# Patient Record
Sex: Female | Born: 1975 | Race: White | Hispanic: Yes | Marital: Single | Smoking: Never smoker
Health system: Southern US, Community
[De-identification: ages and names within clinical notes are randomized; demographics above are authoritative.]

## PROBLEM LIST (undated history)

## (undated) DIAGNOSIS — E119 Type 2 diabetes mellitus without complications: Secondary | ICD-10-CM

## (undated) DIAGNOSIS — I4891 Unspecified atrial fibrillation: Secondary | ICD-10-CM

## (undated) DIAGNOSIS — E139 Other specified diabetes mellitus without complications: Secondary | ICD-10-CM

## (undated) DIAGNOSIS — I10 Essential (primary) hypertension: Secondary | ICD-10-CM

## (undated) DIAGNOSIS — E0590 Thyrotoxicosis, unspecified without thyrotoxic crisis or storm: Secondary | ICD-10-CM

## (undated) HISTORY — DX: Type 2 diabetes mellitus without complications: E11.9

## (undated) HISTORY — DX: Thyrotoxicosis, unspecified without thyrotoxic crisis or storm: E05.90

## (undated) HISTORY — DX: Unspecified atrial fibrillation: I48.91

---

## 2004-02-07 ENCOUNTER — Emergency Department (HOSPITAL_COMMUNITY): Admission: EM | Admit: 2004-02-07 | Discharge: 2004-02-07 | Payer: Self-pay | Admitting: Emergency Medicine

## 2004-04-09 ENCOUNTER — Emergency Department (HOSPITAL_COMMUNITY): Admission: EM | Admit: 2004-04-09 | Discharge: 2004-04-09 | Payer: Self-pay | Admitting: Family Medicine

## 2004-09-28 ENCOUNTER — Inpatient Hospital Stay (HOSPITAL_COMMUNITY): Admission: AD | Admit: 2004-09-28 | Discharge: 2004-09-28 | Payer: Self-pay | Admitting: *Deleted

## 2004-11-04 ENCOUNTER — Inpatient Hospital Stay (HOSPITAL_COMMUNITY): Admission: AD | Admit: 2004-11-04 | Discharge: 2004-11-04 | Payer: Self-pay | Admitting: Obstetrics & Gynecology

## 2005-02-06 ENCOUNTER — Inpatient Hospital Stay (HOSPITAL_COMMUNITY): Admission: AD | Admit: 2005-02-06 | Discharge: 2005-02-06 | Payer: Self-pay | Admitting: Obstetrics and Gynecology

## 2005-02-11 ENCOUNTER — Ambulatory Visit: Payer: Self-pay | Admitting: *Deleted

## 2005-02-12 ENCOUNTER — Ambulatory Visit (HOSPITAL_COMMUNITY): Admission: RE | Admit: 2005-02-12 | Discharge: 2005-02-12 | Payer: Self-pay | Admitting: *Deleted

## 2005-02-14 ENCOUNTER — Ambulatory Visit: Payer: Self-pay | Admitting: Family Medicine

## 2005-02-20 ENCOUNTER — Ambulatory Visit: Payer: Self-pay | Admitting: Family Medicine

## 2005-03-06 ENCOUNTER — Ambulatory Visit: Payer: Self-pay | Admitting: Family Medicine

## 2005-03-13 ENCOUNTER — Ambulatory Visit: Payer: Self-pay | Admitting: Family Medicine

## 2005-03-13 ENCOUNTER — Ambulatory Visit (HOSPITAL_COMMUNITY): Admission: RE | Admit: 2005-03-13 | Discharge: 2005-03-13 | Payer: Self-pay | Admitting: *Deleted

## 2005-03-18 ENCOUNTER — Inpatient Hospital Stay (HOSPITAL_COMMUNITY): Admission: AD | Admit: 2005-03-18 | Discharge: 2005-03-18 | Payer: Self-pay | Admitting: *Deleted

## 2005-03-19 ENCOUNTER — Inpatient Hospital Stay (HOSPITAL_COMMUNITY): Admission: AD | Admit: 2005-03-19 | Discharge: 2005-03-22 | Payer: Self-pay | Admitting: Obstetrics and Gynecology

## 2005-03-20 ENCOUNTER — Ambulatory Visit: Payer: Self-pay | Admitting: Obstetrics and Gynecology

## 2010-12-29 ENCOUNTER — Encounter: Payer: Self-pay | Admitting: *Deleted

## 2012-06-06 ENCOUNTER — Emergency Department (HOSPITAL_COMMUNITY): Payer: Self-pay

## 2012-06-06 ENCOUNTER — Encounter (HOSPITAL_COMMUNITY): Payer: Self-pay | Admitting: Cardiology

## 2012-06-06 ENCOUNTER — Emergency Department (HOSPITAL_COMMUNITY)
Admission: EM | Admit: 2012-06-06 | Discharge: 2012-06-06 | Disposition: A | Discharge status: Home / Self Care | Payer: Self-pay | Attending: Emergency Medicine | Admitting: Emergency Medicine

## 2012-06-06 DIAGNOSIS — S301XXA Contusion of abdominal wall, initial encounter: Secondary | ICD-10-CM | POA: Insufficient documentation

## 2012-06-06 DIAGNOSIS — R42 Dizziness and giddiness: Secondary | ICD-10-CM | POA: Insufficient documentation

## 2012-06-06 DIAGNOSIS — R51 Headache: Secondary | ICD-10-CM | POA: Insufficient documentation

## 2012-06-06 DIAGNOSIS — R079 Chest pain, unspecified: Secondary | ICD-10-CM | POA: Insufficient documentation

## 2012-06-06 DIAGNOSIS — S0990XA Unspecified injury of head, initial encounter: Secondary | ICD-10-CM | POA: Insufficient documentation

## 2012-06-06 DIAGNOSIS — R109 Unspecified abdominal pain: Secondary | ICD-10-CM | POA: Insufficient documentation

## 2012-06-06 LAB — CBC WITH DIFFERENTIAL/PLATELET
Basophils Absolute: 0 10*3/uL (ref 0.0–0.1)
Basophils Relative: 0 % (ref 0–1)
Eosinophils Absolute: 0 10*3/uL (ref 0.0–0.7)
Eosinophils Relative: 0 % (ref 0–5)
HCT: 39.8 % (ref 36.0–46.0)
Hemoglobin: 13.4 g/dL (ref 12.0–15.0)
Lymphocytes Relative: 17 % (ref 12–46)
Lymphs Abs: 2.3 10*3/uL (ref 0.7–4.0)
MCH: 29.5 pg (ref 26.0–34.0)
MCHC: 33.7 g/dL (ref 30.0–36.0)
MCV: 87.5 fL (ref 78.0–100.0)
Monocytes Absolute: 0.8 10*3/uL (ref 0.1–1.0)
Monocytes Relative: 6 % (ref 3–12)
Neutro Abs: 10 10*3/uL — ABNORMAL HIGH (ref 1.7–7.7)
Neutrophils Relative %: 76 % (ref 43–77)
Platelets: 195 10*3/uL (ref 150–400)
RBC: 4.55 MIL/uL (ref 3.87–5.11)
RDW: 12.7 % (ref 11.5–15.5)
WBC: 13 10*3/uL — ABNORMAL HIGH (ref 4.0–10.5)

## 2012-06-06 LAB — POCT I-STAT, CHEM 8
BUN: 9 mg/dL (ref 6–23)
Calcium, Ion: 1.23 mmol/L (ref 1.12–1.32)
Chloride: 106 mEq/L (ref 96–112)
Creatinine, Ser: 0.6 mg/dL (ref 0.50–1.10)
Glucose, Bld: 112 mg/dL — ABNORMAL HIGH (ref 70–99)
HCT: 40 % (ref 36.0–46.0)
Hemoglobin: 13.6 g/dL (ref 12.0–15.0)
Potassium: 3.7 mEq/L (ref 3.5–5.1)
Sodium: 142 mEq/L (ref 135–145)
TCO2: 22 mmol/L (ref 0–100)

## 2012-06-06 LAB — POCT PREGNANCY, URINE: Preg Test, Ur: NEGATIVE

## 2012-06-06 MED ORDER — MORPHINE SULFATE 4 MG/ML IJ SOLN
4.0000 mg | Freq: Once | INTRAMUSCULAR | Status: AC
  Administered 2012-06-06: 4 mg via INTRAVENOUS
  Filled 2012-06-06: qty 1

## 2012-06-06 MED ORDER — HYDROCODONE-ACETAMINOPHEN 5-500 MG PO TABS: 1.0000 | ORAL_TABLET | Freq: Four times a day (QID) | ORAL | Status: AC | PRN

## 2012-06-06 MED ORDER — ONDANSETRON HCL 4 MG/2ML IJ SOLN
4.0000 mg | Freq: Once | INTRAMUSCULAR | Status: AC
  Administered 2012-06-06: 4 mg via INTRAVENOUS
  Filled 2012-06-06: qty 2

## 2012-06-06 MED ORDER — DIPHENHYDRAMINE HCL 50 MG/ML IJ SOLN: 12.5000 mg | Freq: Once | INTRAMUSCULAR | Status: DC

## 2012-06-06 MED ORDER — IOHEXOL 300 MG/ML  SOLN
100.0000 mL | Freq: Once | INTRAMUSCULAR | Status: AC | PRN
  Administered 2012-06-06: 100 mL via INTRAVENOUS

## 2012-06-06 MED ORDER — METHYLPREDNISOLONE SODIUM SUCC 125 MG IJ SOLR: 125.0000 mg | Freq: Once | INTRAMUSCULAR | Status: DC

## 2012-06-06 MED ORDER — IBUPROFEN 600 MG PO TABS: 600.0000 mg | ORAL_TABLET | Freq: Four times a day (QID) | ORAL | Status: AC | PRN

## 2012-06-06 MED ORDER — FAMOTIDINE IN NACL 20-0.9 MG/50ML-% IV SOLN: 20.0000 mg | Freq: Once | INTRAVENOUS | Status: DC
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
## 2012-06-06 NOTE — ED Notes (Signed)
Patient currently denying chest pain. Reports she is feeling much better at this time. Some tenderness noted to right flank.
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
## 2012-06-07 NOTE — ED Provider Notes (Signed)
Medical screening examination/treatment/procedure(s) were performed by non-physician practitioner and as supervising physician I was immediately available for consultation/collaboration.  Shamira Toutant K Linker, MD 06/07/12 1506 

## 2014-03-22 ENCOUNTER — Encounter (HOSPITAL_COMMUNITY): Payer: Self-pay | Admitting: Emergency Medicine

## 2014-03-22 ENCOUNTER — Emergency Department (INDEPENDENT_AMBULATORY_CARE_PROVIDER_SITE_OTHER)
Admission: EM | Admit: 2014-03-22 | Discharge: 2014-03-22 | Disposition: A | Discharge status: Home / Self Care | Payer: Self-pay | Source: Home / Self Care | Attending: Emergency Medicine | Admitting: Emergency Medicine

## 2014-03-22 DIAGNOSIS — N63 Unspecified lump in unspecified breast: Secondary | ICD-10-CM

## 2014-03-22 DIAGNOSIS — N6310 Unspecified lump in the right breast, unspecified quadrant: Secondary | ICD-10-CM

## 2014-03-22 NOTE — ED Provider Notes (Signed)
CSN: [id number]     Arrival date & time 03/22/14  1818 History   First MD Initiated Contact with Patient 03/22/14 2007     Chief Complaint  Patient presents with  . Back Pain   (Consider location/radiation/quality/duration/timing/severity/associated sxs/prior Treatment) HPI  Right flank pain:  1 week duration, starts on side of ribs and radiates across right breast, pt has noticed a subjective fever with the pain and this is relieved by ibuprofen; she denies any trauma or falls but does repeated lifting in the house, no swelling of the breast, changes to the skin, or nipple drainage; no history of back surgery or imaging; history of breast cancer     History reviewed. No pertinent past medical history. History reviewed. No pertinent past surgical history. No family history on file. History  Substance Use Topics  . Smoking status: Never Smoker   . Smokeless tobacco: Not on file  . Alcohol Use: No   OB History   Grav Para Term Preterm Abortions TAB SAB Ect Mult Living                 Review of Systems Negative for weight loss, chills, decreased appetite Allergies  Review of patient's allergies indicates no known allergies.  Home Medications   Prior to Admission medications   Medication Sig Start Date End Date Taking? Authorizing Provider  ibuprofen (ADVIL,MOTRIN) 200 MG tablet Take 200 mg by mouth every 6 (six) hours as needed.   Yes Historical Provider, MD   BP 154/89  Pulse 66  Temp(Src) 98 F (36.7 C) (Oral)  SpO2 100% Physical Exam Gen. Obese Hispanic female, non-ill appearing Cardiovascular: regular rate and rhythm, 2/6 systolic murmur at left upper sternal border Pulmonary: no work of breathing clear to auscultation bilaterally Chest wall: mild tenderness to palpation of ribs 67 and 8 at mid axillary line on the right Right breast: tenderness palpation right upper quadrant where there is a firm, fixed irregularly shaped 2 cm x 1 cm mass, no nipple drainage or  skin change, right axilla with palpable tender lymph node Left breast: normal appearance, no masses palpable  ED Course  Procedures (including critical care time) Labs Review Labs Reviewed - No data to display   Imaging Review No results found.   MDM   1. Breast mass, right    This breast mass is concerning given age, unilateral, and isolated with tender lymphadenopathy. I will refer the patient for a diagnostic mammogram and recommend continuing ibuprofen with a 15 lb weight lifting restriction for now.      Garnetta BuddyEdward V Yomayra Tate, MD 03/22/14 2131

## 2014-03-22 NOTE — Discharge Instructions (Signed)
Fue Curatorun placer conocerte. Lamento que usted tiene Journalist, newspaperdolor en el pecho. Tenemos que tener su pecho evaluado con [place] mamografa. He realizado un pedido de [place], y el centro de imgenes debera llamarle. Si usted no ha recibido Owens & Minoruna llamada en 3 das, por favor llamar a Programme researcher, broadcasting/film/videovolver. Por favor, contine ibuprofeno, segn sea necesario.  [place],  Dr. Clinton SawyerWilliamson  It was nice to meet you. I am sorry that you have pain in your breast. We need to have your breast evaluated with a mammogram.  I have placed an order for this, and the imaging center should call you. If you have not recieved a call in 3 days, then please call us back. Please continue ibuprofen as needed.   Take Care,   Dr. Clinton Sawyerwilliamson

## 2014-03-22 NOTE — ED Notes (Signed)
Pain in torso, pain in right breast predominantly.  Described as soreness.  Reports fatigue and fever. No known injury.  Reports head congestion and cough

## 2014-03-23 ENCOUNTER — Other Ambulatory Visit (HOSPITAL_COMMUNITY): Payer: Self-pay | Admitting: Family Medicine

## 2014-03-23 DIAGNOSIS — N644 Mastodynia: Secondary | ICD-10-CM

## 2014-03-23 DIAGNOSIS — N63 Unspecified lump in unspecified breast: Secondary | ICD-10-CM

## 2014-03-23 NOTE — ED Provider Notes (Signed)
Medical screening examination/treatment/procedure(s) were performed by a resident physician and as supervising physician I was immediately available for consultation/collaboration.  Leslee Homeavid Greg Cratty, M.D.  Reuben Likesavid C Taevin Mcferran, MD 03/23/14 [phone]

## 2014-04-05 ENCOUNTER — Other Ambulatory Visit: Payer: Self-pay

## 2014-04-25 ENCOUNTER — Encounter: Payer: Self-pay | Admitting: Family Medicine

## 2014-04-25 ENCOUNTER — Telehealth: Payer: Self-pay | Admitting: Family Medicine
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
## 2014-07-14 ENCOUNTER — Other Ambulatory Visit: Payer: Self-pay | Admitting: Nurse Practitioner

## 2014-07-14 DIAGNOSIS — N63 Unspecified lump in unspecified breast: Secondary | ICD-10-CM

## 2014-08-07 ENCOUNTER — Ambulatory Visit
Admission: RE | Admit: 2014-08-07 | Discharge: 2014-08-07 | Disposition: A | Discharge status: Home / Self Care | Payer: No Typology Code available for payment source | Source: Ambulatory Visit | Attending: Nurse Practitioner | Admitting: Nurse Practitioner

## 2014-08-07 DIAGNOSIS — N63 Unspecified lump in unspecified breast: Secondary | ICD-10-CM

## 2015-02-12 ENCOUNTER — Other Ambulatory Visit: Payer: Self-pay | Admitting: Nurse Practitioner

## 2015-02-12 DIAGNOSIS — R921 Mammographic calcification found on diagnostic imaging of breast: Secondary | ICD-10-CM

## 2015-03-01 ENCOUNTER — Other Ambulatory Visit: Payer: Self-pay | Admitting: Obstetrics and Gynecology

## 2015-03-01 DIAGNOSIS — R921 Mammographic calcification found on diagnostic imaging of breast: Secondary | ICD-10-CM

## 2015-03-02 ENCOUNTER — Other Ambulatory Visit (HOSPITAL_COMMUNITY): Payer: Self-pay | Admitting: *Deleted

## 2015-03-02 DIAGNOSIS — R921 Mammographic calcification found on diagnostic imaging of breast: Secondary | ICD-10-CM

## 2015-03-22 ENCOUNTER — Ambulatory Visit
Admission: RE | Admit: 2015-03-22 | Discharge: 2015-03-22 | Disposition: A | Discharge status: Home / Self Care | Payer: No Typology Code available for payment source | Source: Ambulatory Visit | Attending: Obstetrics and Gynecology | Admitting: Obstetrics and Gynecology

## 2015-03-22 ENCOUNTER — Encounter (HOSPITAL_COMMUNITY): Payer: Self-pay

## 2015-03-22 ENCOUNTER — Ambulatory Visit (HOSPITAL_COMMUNITY)
Admission: RE | Admit: 2015-03-22 | Discharge: 2015-03-22 | Disposition: A | Discharge status: Home / Self Care | Payer: Self-pay | Source: Ambulatory Visit | Attending: Obstetrics and Gynecology | Admitting: Obstetrics and Gynecology

## 2015-03-22 ENCOUNTER — Other Ambulatory Visit: Payer: Self-pay

## 2015-03-22 VITALS — BP 120/84 | Temp 98.0°F | Ht 59.0 in | Wt 171.8 lb

## 2015-03-22 DIAGNOSIS — R921 Mammographic calcification found on diagnostic imaging of breast: Secondary | ICD-10-CM

## 2015-03-22 DIAGNOSIS — Z1239 Encounter for other screening for malignant neoplasm of breast: Secondary | ICD-10-CM

## 2015-03-22 NOTE — Progress Notes (Signed)
No complaints today.  Pap Smear:  Pap smear not completed today. [facility] and normal per patient. Per patient has no history of an abnormal Pap smear. No Pap smear results in EPIC.  Physical exam: Breasts Breasts symmetrical. No skin abnormalities bilateral breasts. No nipple retraction bilateral breasts. No nipple discharge bilateral breasts. No lymphadenopathy. No lumps palpated bilateral breasts. No complaints of pain or tenderness on exam. [facility] breast diagnostic mamm[facility] per recommendation. Appointment scheduled for Thursday, March 22, 2015 at 1000.    Pelvic/Bimanual No Pap smear completed today since last Pap smear was in May 2015 per patient. Pap smear not indicated per BCCCP guidelines.   Used interpreter [place].

## 2015-03-22 NOTE — Patient Instructions (Signed)
Educational materials on self breast awareness given. Explained to [name] that she did not need a Pap smear today due to last Pap smear was in May 2015 per patient. Let her know BCCCP will cover Pap smears every 3 years unless has a history of abnormal Pap smears. [facility] breast diagnostic mamm[facility] per recommendation. Appointment scheduled for Thursday, March 22, 2015 at 1000. Patient aware of appointment and will be there. [name] verbalized understanding.  Laurell Coalson, Kathaleen Maserhristine Poll, RN 8:47 AM

## 2015-08-20 ENCOUNTER — Other Ambulatory Visit: Payer: Self-pay | Admitting: Obstetrics and Gynecology

## 2015-08-20 DIAGNOSIS — R921 Mammographic calcification found on diagnostic imaging of breast: Secondary | ICD-10-CM

## 2015-08-29 ENCOUNTER — Other Ambulatory Visit: Payer: Self-pay | Admitting: Obstetrics and Gynecology

## 2015-08-29 ENCOUNTER — Ambulatory Visit
Admission: RE | Admit: 2015-08-29 | Discharge: 2015-08-29 | Disposition: A | Discharge status: Home / Self Care | Payer: No Typology Code available for payment source | Source: Ambulatory Visit | Attending: Obstetrics and Gynecology | Admitting: Obstetrics and Gynecology

## 2015-08-29 DIAGNOSIS — R928 Other abnormal and inconclusive findings on diagnostic imaging of breast: Secondary | ICD-10-CM

## 2015-08-29 DIAGNOSIS — R921 Mammographic calcification found on diagnostic imaging of breast: Secondary | ICD-10-CM

## 2015-08-30 ENCOUNTER — Ambulatory Visit
Admission: RE | Admit: 2015-08-30 | Discharge: 2015-08-30 | Disposition: A | Discharge status: Home / Self Care | Payer: No Typology Code available for payment source | Source: Ambulatory Visit | Attending: Obstetrics and Gynecology | Admitting: Obstetrics and Gynecology

## 2015-08-30 DIAGNOSIS — R928 Other abnormal and inconclusive findings on diagnostic imaging of breast: Secondary | ICD-10-CM

## 2015-11-07 ENCOUNTER — Encounter (HOSPITAL_COMMUNITY): Payer: Self-pay | Admitting: Neurology

## 2015-11-07 ENCOUNTER — Emergency Department (HOSPITAL_COMMUNITY): Payer: No Typology Code available for payment source

## 2015-11-07 ENCOUNTER — Emergency Department (HOSPITAL_COMMUNITY)
Admission: EM | Admit: 2015-11-07 | Discharge: 2015-11-07 | Disposition: A | Discharge status: Home / Self Care | Payer: Self-pay | Attending: Emergency Medicine | Admitting: Emergency Medicine

## 2015-11-07 ENCOUNTER — Emergency Department (HOSPITAL_COMMUNITY): Payer: Self-pay

## 2015-11-07 DIAGNOSIS — K529 Noninfective gastroenteritis and colitis, unspecified: Secondary | ICD-10-CM | POA: Insufficient documentation

## 2015-11-07 DIAGNOSIS — R509 Fever, unspecified: Secondary | ICD-10-CM | POA: Insufficient documentation

## 2015-11-07 DIAGNOSIS — R1084 Generalized abdominal pain: Secondary | ICD-10-CM

## 2015-11-07 DIAGNOSIS — R300 Dysuria: Secondary | ICD-10-CM | POA: Insufficient documentation

## 2015-11-07 DIAGNOSIS — R51 Headache: Secondary | ICD-10-CM | POA: Insufficient documentation

## 2015-11-07 DIAGNOSIS — R350 Frequency of micturition: Secondary | ICD-10-CM | POA: Insufficient documentation

## 2015-11-07 LAB — COMPREHENSIVE METABOLIC PANEL
ALK PHOS: 81 U/L (ref 38–126)
ALT: 44 U/L (ref 14–54)
AST: 31 U/L (ref 15–41)
Albumin: 3.8 g/dL (ref 3.5–5.0)
Anion gap: 7 (ref 5–15)
BILIRUBIN TOTAL: 0.2 mg/dL — AB (ref 0.3–1.2)
CHLORIDE: 104 mmol/L (ref 101–111)
CO2: 26 mmol/L (ref 22–32)
Calcium: 8.7 mg/dL — ABNORMAL LOW (ref 8.9–10.3)
Creatinine, Ser: 0.71 mg/dL (ref 0.44–1.00)
GFR calc non Af Amer: >60 mL/min (ref >60)
Glucose, Bld: 97 mg/dL (ref 65–99)
Potassium: 3.5 mmol/L (ref 3.5–5.1)
SODIUM: 137 mmol/L (ref 135–145)
Total Protein: 8 g/dL (ref 6.5–8.1)

## 2015-11-07 LAB — CBC
HCT: 40.8 % (ref 36.0–46.0)
HEMOGLOBIN: 13.6 g/dL (ref 12.0–15.0)
MCH: 29.3 pg (ref 26.0–34.0)
MCHC: 33.3 g/dL (ref 30.0–36.0)
MCV: 87.9 fL (ref 78.0–100.0)
PLATELETS: 189 10*3/uL (ref 150–400)
RBC: 4.64 MIL/uL (ref 3.87–5.11)
RDW: 12.7 % (ref 11.5–15.5)
WBC: 7.4 10*3/uL (ref 4.0–10.5)

## 2015-11-07 LAB — I-STAT BETA HCG BLOOD, ED (MC, WL, AP ONLY)

## 2015-11-07 LAB — URINALYSIS, ROUTINE W REFLEX MICROSCOPIC
BILIRUBIN URINE: NEGATIVE
Glucose, UA: NEGATIVE mg/dL
Ketones, ur: NEGATIVE mg/dL
Leukocytes, UA: NEGATIVE
Nitrite: NEGATIVE
PROTEIN: NEGATIVE mg/dL
Specific Gravity, Urine: 1.009 (ref 1.005–1.030)
pH: 6 (ref 5.0–8.0)

## 2015-11-07 LAB — LIPASE, BLOOD: Lipase: 22 U/L (ref 11–51)

## 2015-11-07 LAB — URINE MICROSCOPIC-ADD ON

## 2015-11-07 MED ORDER — IBUPROFEN 800 MG PO TABS: 800.0000 mg | ORAL_TABLET | Freq: Three times a day (TID) | ORAL | Status: DC

## 2015-11-07 MED ORDER — ONDANSETRON HCL 4 MG/2ML IJ SOLN
4.0000 mg | Freq: Once | INTRAMUSCULAR | Status: AC
  Administered 2015-11-07: 4 mg via INTRAVENOUS
  Filled 2015-11-07: qty 2

## 2015-11-07 MED ORDER — MORPHINE SULFATE (PF) 4 MG/ML IV SOLN
4.0000 mg | Freq: Once | INTRAVENOUS | Status: AC
  Administered 2015-11-07: 4 mg via INTRAVENOUS
  Filled 2015-11-07: qty 1

## 2015-11-07 MED ORDER — METRONIDAZOLE 500 MG PO TABS: 500.0000 mg | ORAL_TABLET | Freq: Three times a day (TID) | ORAL | Status: DC

## 2015-11-07 MED ORDER — IOHEXOL 300 MG/ML  SOLN
100.0000 mL | Freq: Once | INTRAMUSCULAR | Status: AC | PRN
Start: 2015-11-07 — End: 2015-11-07
  Administered 2015-11-07: 100 mL via INTRAVENOUS

## 2015-11-07 MED ORDER — SODIUM CHLORIDE 0.9 % IV BOLUS (SEPSIS)
1000.0000 mL | Freq: Once | INTRAVENOUS | Status: AC
  Administered 2015-11-07: 1000 mL via INTRAVENOUS

## 2015-11-07 MED ORDER — CIPROFLOXACIN HCL 500 MG PO TABS: 500.0000 mg | ORAL_TABLET | Freq: Two times a day (BID) | ORAL | Status: DC

## 2015-11-07 NOTE — ED Notes (Addendum)
Pt reports n/d, generalized abd pain for several days. Has has a fever also.

## 2015-11-07 NOTE — ED Notes (Signed)
Pt taken to CT.

## 2015-11-07 NOTE — ED Provider Notes (Signed)
CSN: [id number]     Arrival date & time 11/07/15  1527 History   First MD Initiated Contact with Patient 11/07/15 1832     Chief Complaint  Patient presents with  . Abdominal Pain     (Consider location/radiation/quality/duration/timing/severity/associated sxs/prior Treatment) Patient is a 39 y.o. female presenting with abdominal pain. The history is provided by the patient. The history is limited by a language barrier. Language interpreter used: Patient's son.  Abdominal Pain    Susan [name] is a 39 y.o. female with no significant PMH who presents with gradual onset, worsening, moderate generalized abdominal pain for the past 3 days.  She describes it as ripping, and it only occurs when she eats or drinks.  She denies pain if she is not eating or drinking.  She has tried ibuprofen and pepto bismol with mild relief.  Associated symptoms include nausea and nonbloody diarrhea, increased urinary frequency, dysuria, subjective fevers, headache.  Denies hematuria, vaginal discharge, or vaginal bleeding.   History reviewed. No pertinent past medical history. History reviewed. No pertinent past surgical history. Family History  Problem Relation Age of Onset  . Diabetes Mother   . Diabetes Father    Social History  Substance Use Topics  . Smoking status: Never Smoker   . Smokeless tobacco: Never Used  . Alcohol Use: No   OB History    Gravida Para Term Preterm AB TAB SAB Ectopic Multiple Living   5 4 4  1 1    4      Review of Systems  All other systems negative unless otherwise stated in HPI     Allergies  Review of patient's allergies indicates no known allergies.  Home Medications   Prior to Admission medications   Medication Sig Start Date End Date Taking? Authorizing Provider  bismuth subsalicylate (PEPTO BISMOL) 262 MG/15ML suspension Take 30 mLs by mouth every 6 (six) hours as needed for indigestion.    Yes Historical Provider, MD  ciprofloxacin (CIPRO) 500 MG  tablet Take 1 tablet (500 mg total) by mouth 2 (two) times daily. 11/07/15   Cheri FowlerKayla Jung Yurchak, PA-C  ibuprofen (ADVIL,MOTRIN) 800 MG tablet Take 1 tablet (800 mg total) by mouth 3 (three) times daily. 11/07/15   Cheri FowlerKayla Codey Burling, PA-C  metroNIDAZOLE (FLAGYL) 500 MG tablet Take 1 tablet (500 mg total) by mouth 3 (three) times daily. 11/07/15   Ramsay Bognar, PA-C   BP 166/84 mmHg  Pulse 84  Temp(Src) 98.2 F (36.8 C) (Oral)  Resp 16  SpO2 100%  LMP 10/02/2015 Physical Exam  Constitutional: She is oriented to person, place, and time. She appears well-developed and well-nourished.  HENT:  Head: Normocephalic and atraumatic.  Mouth/Throat: Oropharynx is clear and moist.  Eyes: Conjunctivae are normal. Pupils are equal, round, and reactive to light.  Neck: Normal range of motion. Neck supple.  Cardiovascular: Normal rate, regular rhythm and normal heart sounds.   No murmur heard. Pulmonary/Chest: Effort normal and breath sounds normal. No accessory muscle usage or stridor. No respiratory distress. She has no wheezes. She has no rhonchi. She has no rales.  Abdominal: Soft. Bowel sounds are normal. She exhibits no distension. There is generalized tenderness. There is no rigidity, no rebound and no guarding.  Musculoskeletal: Normal range of motion.  Lymphadenopathy:    She has no cervical adenopathy.  Neurological: She is alert and oriented to person, place, and time.  Speech clear without dysarthria.  Skin: Skin is warm and dry.  Psychiatric: She has a normal mood and  affect. Her behavior is normal.    ED Course  Procedures (including critical care time) Labs Review Labs Reviewed  COMPREHENSIVE METABOLIC PANEL - Abnormal; Notable for the following:    BUN <5 (*)    Calcium 8.7 (*)    Total Bilirubin 0.2 (*)    All other components within normal limits  URINALYSIS, ROUTINE W REFLEX MICROSCOPIC (NOT AT [facility]) - Abnormal; Notable for the following:    Hgb urine dipstick MODERATE (*)    All other  components within normal limits  URINE MICROSCOPIC-ADD ON - Abnormal; Notable for the following:    Squamous Epithelial / LPF 0-5 (*)    Bacteria, UA RARE (*)    All other components within normal limits  LIPASE, BLOOD  CBC  I-STAT BETA HCG BLOOD, ED (MC, WL, AP ONLY)    Imaging Review Ct Abdomen Pelvis W Contrast  11/07/2015  CLINICAL DATA:  Generalized abdominal pain for several days with nausea and diarrhea. Fever. Right lower quadrant pain and tenderness. EXAM: CT ABDOMEN AND PELVIS WITH CONTRAST TECHNIQUE: Multidetector CT imaging of the abdomen and pelvis was performed using the standard protocol following bolus administration of intravenous contrast. CONTRAST:  OMNIPAQUE IOHEXOL 300 MG/ML  SOLN COMPARISON:  06/06/2012 FINDINGS: Lower chest: Minor dependent bibasilar atelectasis. Normal heart size. No pericardial or pleural effusion. Hepatobiliary: Diffuse mild hypoattenuation of the liver parenchyma compatible with hepatic steatosis or fatty infiltration. No focal hepatic abnormality or biliary dilatation. Hepatic and portal veins are patent. Gallbladder and biliary system unremarkable. Pancreas: No mass, inflammatory changes, or other significant abnormality. Spleen: Within normal limits in size and appearance. Adrenals/Urinary Tract: No masses identified. No evidence of hydronephrosis. Stomach/Bowel: Negative for bowel obstruction, significant dilatation, ileus, or free air. Wall thickening and edema present of the cecum, right colon, and transverse colon. There is sparing of the splenic flexure and distal colon. Appearance is compatible with mild colitis. Vascular/Lymphatic: No adenopathy. Intact aorta. No aneurysm or vascular process. Mesenteric vasculature all appear patent. Reproductive: Uterus and adnexal normal in size. No pelvic free fluid or fluid collection. Other: No inguinal abnormality or hernia.  Intact abdominal wall. Musculoskeletal:  No acute or abnormal osseous finding.  IMPRESSION: Mild acute colitis involving the cecum, right colon, and transverse colon. Remainder of the colon is spared. No associated obstruction, ileus, or free air.  No pneumatosis. Mild hepatic steatosis Electronically Signed   By: Judie Petit.  Shick M.D.   On: 11/07/2015 21:14   US Abdomen Limited Ruq  11/07/2015  CLINICAL DATA:  Postprandial abdominal pain. EXAM: US ABDOMEN LIMITED - RIGHT UPPER QUADRANT COMPARISON:  None. FINDINGS: Gallbladder: No gallstones or wall thickening visualized. No sonographic Murphy sign noted. Common bile duct: Diameter: Normal in caliber at the porta hepatis measuring 3.9 mm diameter. Upper limits of normal at the pancreatic head measuring 7 mm. No bile duct stone seen. No intrahepatic bile duct dilatation. Liver: Diffusely echogenic consistent with fatty infiltration. No focal mass or lesion identified within the liver. No intrahepatic bile duct dilatation seen. Main portal vein is shown to be patent with appropriate hepatopetal direction of blood flow. IMPRESSION: 1. Fatty infiltration of the liver. 2. Gallbladder appears normal. No gallstones seen. No sonographic evidence of acute cholecystitis. Electronically Signed   By: Bary Richard M.D.   On: 11/07/2015 19:56   I have personally reviewed and evaluated these images and lab results as part of my medical decision-making.   EKG Interpretation None      MDM   Final  diagnoses:  Colitis    Patient presents with generalized abdominal pain for the past couple of days made worse with eating.  VSS, NAD.  On exam, heart RRR, lungs CTAB, abdomen is soft with generalized tenderness, no rebound or guarding.  Will obtain labs and RUQ u/s.  -CMP, CBC, lipase unremarkable. -hcg negative -UA shows moderate hgb, RBC 6-30.   -RUQ U/S shows no evidence of cholelithiasis or acute cholecystitis.  Will obtain CT abdomen.  CT abdomen shows mild acute colitis involving the cecum, right colon, and transverse colon.  Will treat with  cipro and flagyl.  Evaluation does not show pathology requring ongoing emergent intervention or admission. Pt is hemodynamically stable and mentating appropriately. Discussed findings/results and plan with patient/guardian, who agrees with plan. All questions answered. Return precautions discussed and outpatient follow up given.   Case has been discussed with and seen by Dr. Denton Lank who agrees with the above plan for discharge.        Cheri Fowler, PA-C 11/07/15 2200  Cathren L[place], MD 11/07/15 [phone]

## 2015-11-07 NOTE — ED Notes (Signed)
Pt taken to US

## 2015-11-07 NOTE — Discharge Instructions (Signed)
Colitis (Colitis) La colitis es la inflamacin del colon y puede durar un breve perodo [place](aguda) o prolongarse mucho tiempo (crnica). CAUSAS Esta afeccin puede ser causada por lo siguiente:  Virus.  Bacterias.  Reacciones a medicamentos.  Algunas enfermedades autoinmunitarias, como la enfermedad de Crohn y la colitis ulcerosa. SNTOMAS Los sntomas de esta afeccin incluyen lo siguiente:  Diarrea.  Materia fecal sanguinolenta o alquitranada.  Dolor.  Grant RutsFiebre.  Vmitos.  Cansancio (fatiga).  Prdida de peso.  Meteorismo.  Aumento repentino del dolor abdominal.  Menos deposiciones que lo habitual. DIAGNSTICO Esta afeccin se diagnostica mediante un anlisis de materia fecal o de sangre. Tambin pueden Dean Foods Companyhacerse otros estudios, como radiografas, una tomografa computarizada (TC) o una colonoscopia. TRATAMIENTO El tratamiento puede incluir lo siguiente:  Visual merchandiserDejar descansar a los intestinos, es Designer, jewellerydecir, no consumir alimentos ni lquidos durante un tiempo.  Administracin de lquidos por va intravenosa (IV).  Medicamentos para Chief Technology Officerel dolor y Technical sales engineerla diarrea.  Antibiticos.  Medicamentos con cortisona.  Ciruga. INSTRUCCIONES PARA EL CUIDADO EN EL HOGAR Comida y bebida  Siga las indicaciones del mdico respecto de las restricciones para las comidas o las bebidas.  Beba suficiente lquido para Photographermantener la orina clara o de color amarillo plido.  Trabaje con un nutricionista para determinar cules son los alimentos que Merchandiser, retailreagudizan la afeccin.  Evite los alimentos que reagudizan la afeccin.  Consumir una Tree surgeondieta bien balanceada. Medicamentos  Baxter Internationalome los medicamentos de venta libre y los recetados solamente como se lo haya indicado el mdico.  Si le recetaron un antibitico, tmelo como se lo haya indicado el mdico. No deje de tomar los antibiticos aunque comience a sentirse mejor. Instrucciones generales  Concurra a todas las visitas de control como se lo haya indicado  el mdico. Esto es importante. SOLICITE ATENCIN MDICA SI:  Los sntomas no desaparecen.  Presenta nuevos sntomas. SOLICITE ATENCIN MDICA DE INMEDIATO SI:  Tiene fiebre que no desaparece con tratamiento.  Comienza a sentir escalofros.  Se siente muy dbil, se desmaya o se deshidrata.  Ha vomitado repetidas veces.  Siente dolor intenso en el abdomen.  Su materia fecal es sanguinolenta o alquitranada.   Esta informacin no tiene Theme park managercomo fin reemplazar el consejo del mdico. Asegrese de hacerle al mdico cualquier pregunta que tenga.   Document Released: 11/24/2005 Document Revised: 08/15/2015 Elsevier Interactive Patient Education Yahoo[employer] Inc2016 Elsevier Inc.   Emergency Department Resource Guide 1) Find a Doctor and Pay Out of Pocket Although you won't have to find out who is covered by your insurance plan, it is a good idea to ask around and get recommendations. You will then need to call the office and see if the doctor you have chosen will accept you as a new patient and what types of options they offer for patients who are self-pay. Some doctors offer discounts or will set up payment plans for their patients who do not have insurance, but you will need to ask so you aren't surprised when you get to your appointment.  2) Contact Your Local Health Department Not all health departments have doctors that can see patients for sick visits, but many do, so it is worth a call to see if yours does. If you don't know where your local health department is, you can check in your phone book. The CDC also has a tool to help you locate your state's health department, and many state websites also have listings of all of their local health departments.  3) Find a Walk-in Clinic If your illness  is not likely to be very severe or complicated, you may want to try a walk in clinic. These are popping up all over the country in pharmacies, drugstores, and shopping centers. They're usually staffed by nurse  practitioners or physician assistants that have been trained to treat common illnesses and complaints. They're usually fairly quick and inexpensive. However, if you have serious medical issues or chronic medical problems, these are probably not your best option.  No Primary Care Doctor: - Call Health Connect at  [phone] - they can help you locate a primary care doctor that  accepts your insurance, provides certain services, etc. - Physician Referral Service- [phone]  Chronic Pain Problems: Organization         Address  Phone   Notes  [facility]  [phone] Patients need to be referred by their primary care doctor.   Medication Assistance: Organization         Address  Phone   Notes  [facility] Medication [facility] [address] [place] [place], [place] [address] [phone] --Must be a resident of [facility] -- Must have NO insurance coverage whatsoever (no Medicaid/ Medicare, etc.) -- The pt. MUST have a primary care doctor that directs their care regularly and follows them in the community   [facility]ist  [phone]   [employer]  [phone]    Agencies that provide inexpensive medical care: Organization         Address  Phone   Notes  [facility]  [phone]   [facility]    [phone]   [facility] [address] [place], [place] [address] [phone]   [facility] [address] [place] [place] [phone]   Planned Parenthood    [phone]   [facility]    [phone]   [facility]  [address], [place] Phone:  [phone], Fax:  [phone] Hours of Operation:  9 am - 6 pm, M-F.  Also accepts Medicaid/Medicare and self-pay.  [facility] for Children  [address], [place] Phone: [phone], Fax: [phone]. Hours of Operation:  8:30 am -  5:30 pm, M-F.  Also accepts Medicaid and self-pay.  [facility] [place] [address], [place] Phone: [phone]   [facility] [address] Natasha Bence [place], [place] [phone], Ext. [phone] Mondays & Thursdays: 7-9 AM.  First 15 patients are seen on a first come, first serve basis.    Medicaid-accepting [facility] Providers:  Organization         Address  Phone   Notes  [facility] [address], [place] [phone] Also accepts self-pay patients.  [facility] [address] Laurell Josephs [place], [place]  [phone]   [facility] [address], [place] [phone]   [facility] [address] [place] [phone]   Renaye Rakers [address], [place]   [phone] Only accepts [place] Access [place] patients after they have their name applied to their card.   Self-Pay (no insurance) in [facility]:  Organization         Address  Phone   Notes  Sickle Cell Patients, [facility] [address] [place], [place] [phone]  [facility] [address] [place], [place] [phone]   [facility] [place]  [address], [place] [phone]   [facility]/Dr. Osei-Bonsu  [address] [place] or [address], [place] [phone] Phone number for both [place] and [place] locations is the same.  [facility] [address], [place] [phone]   [facility] [address] [place] or [address] [phone]   [facility] [address], [place] [phone], phone; [phone], fax Sees patients 1st and 3rd Saturday of every month.  Must not qualify for public or private insurance (i.e. Medicaid, Medicare, [place] Health Choice,  Veterans' Benefits)  Household income should be no more than 200% of the poverty level The clinic cannot treat you if you are pregnant or think you are pregnant  Sexually transmitted diseases are not treated at the clinic.    Dental Care: Organization         Address  Phone  Notes  [facility] Department of [facility] [address] [place], [place] [phone] Accepts children up to age 23 who are enrolled in [place] or [place] Health Choice; pregnant women with a Medicaid card; and children who have applied for Medicaid or [place] Health Choice, but were declined, whose parents can pay a reduced fee at time of service.  [facility] of [facility]  [address], [place] [phone] Accepts children up to age 78 who are enrolled in [place] or [place] Health Choice; pregnant women with a Medicaid card; and children who have applied for Medicaid or [place] Health Choice, but were declined, whose parents can pay a reduced fee at time of service.  [facility]  [address] [place], [place] [phone] Patients are seen by appointment only. Walk-ins are not accepted. [facility] will see patients 70 years of age and older. Monday - Tuesday (8am-5pm) Most Wednesdays (8:30-5pm) $30 per visit, cash only  [facility] Adult Dental Access [facility]  [address], [facility] [phone] Patients are seen by appointment only. Walk-ins are not accepted. [facility] will see patients 26 years of age and older. One Wednesday Evening (Monthly: Volunteer Based).  $30 per visit, cash only  [employer]  [phone] for adults; Children under age 46, call Graduate Pediatric Dentistry at [phone]. Children aged 52-14, please call [phone] to request a pediatric application.  Dental services are provided in all areas of dental care including fillings, crowns and bridges, complete and  partial dentures, implants, gum treatment, root canals, and extractions. Preventive care is also provided. Treatment is provided to both adults and children. Patients are selected via a lottery and there is often a waiting list.   [facility] [place] [facility] [place] [address] [place]  [phone] [email or web address]   [facility] [address] [place], [place] [phone], Ext. [phone] Second and Fourth Thursday of each month, opens at 6:30 AM; Clinic ends at 9 AM.  Patients are seen on a first-come first-served basis, and a limited number are seen during each clinic.   [facility]  [address] Ether Griffins [place], [place] [phone]   Eligibility Requirements You must have lived in [place], [place], or [place] counties for at least the  last three months.   You cannot be eligible for state or federal sponsored [employer], including [employer], [place], or [employer].   You generally cannot be eligible for healthcare insurance through your employer.    How to apply: Eligibility screenings are held every Tuesday and Wednesday afternoon from 1:00 pm until 4:00 pm. You do not need an appointment for the interview!  [facility] [address], [place], [place]   [place]  [place] Department  [place]  [phone]    Behavioral Health Resources in the Community: Intensive Outpatient Programs Organization         Address  Phone  Notes  [place]. [address] [place], [place] [phone]   [facility] Outpatient [address], [place], [place]   ADS: Alcohol & Drug Svcs [address] [place], [place]   [place] [address]  [place], [place] or [phone]   Substance Abuse Resources Organization          Address  Phone  Notes  Alcohol and Drug Services  [phone]   [place]  [phone]   [facility] [place]   Chinita Pester  [phone]   Residential & Outpatient Substance Abuse Program  [phone]   Psychological Services Organization         Address  Phone  Notes  [facility] [place]  [place]  [phone]   [place] [address], [place] or [phone]    Mobile Crisis Teams Organization         Address  Phone  Notes  [facility], Mobile Crisis Care Unit  [phone]   [facility]  [address] [place], [place]   [place] [address] [place] [phone]    Self-Help/Support Groups Organization         Address  Phone             Notes  [place]. of [place] - variety of support groups  [place] Call for more information  Narcotics Anonymous (NA), [facility] [address], [employer] [place]  2 meetings at this location   Special educational needs teacher         Address  Phone  Notes  [facility]AP Residential Treatment [place],    [place]  [phone]   [facility]  [address], [place] [phone], [place], [place]   [place], [place] [phone] Admissions: 8am-3pm M-F  Incentives Substance [place] [address],    [place], [place] [phone]   [facility] [address] [place], [place], [place]   [facility] [address],  [place], [place]   [facility] - Intensive Outpatient [address] Kristeen [address], [place], [place]   [facility] ([place].,  [place], [place] [phone] or [phone]   [facility] Services (RTS) [address] [place], [place] Accepts Medicaid  [facility] [address],  [place] [phone] Substance Abuse/Addiction Treatment   [facility] Organization         Address  Phone  Notes  [facility]  [phone]  Domenic Schwab, PhD [address] Arlis Porta [place], [place]   [phone] or [phone]   [facility] [place], [place] [phone]   [address], [place], [place] [phone] Insurance/Medicaid/sponsorship through [facility] and Families [address] [place]                                    [place], [place] [phone] [place] [address], [place] [phone]    Dr. Adele Schilder  [phone]   [facility] Dept. 1) [address] [place] 2) [place] 3)  [place] [address], [place] [phone]  [phone]   [place] [phone] or [phone] (After Hours)

## 2016-02-04 ENCOUNTER — Other Ambulatory Visit: Payer: Self-pay | Admitting: Obstetrics and Gynecology

## 2016-02-04 DIAGNOSIS — N6320 Unspecified lump in the left breast, unspecified quadrant: Secondary | ICD-10-CM

## 2016-02-28 ENCOUNTER — Ambulatory Visit
Admission: RE | Admit: 2016-02-28 | Discharge: 2016-02-28 | Disposition: A | Discharge status: Home / Self Care | Payer: No Typology Code available for payment source | Source: Ambulatory Visit | Attending: Obstetrics and Gynecology | Admitting: Obstetrics and Gynecology

## 2016-02-28 DIAGNOSIS — N6320 Unspecified lump in the left breast, unspecified quadrant: Secondary | ICD-10-CM

## 2016-08-26 ENCOUNTER — Other Ambulatory Visit (HOSPITAL_COMMUNITY): Payer: Self-pay | Admitting: *Deleted

## 2016-08-26 DIAGNOSIS — R921 Mammographic calcification found on diagnostic imaging of breast: Secondary | ICD-10-CM

## 2016-09-04 ENCOUNTER — Encounter (HOSPITAL_COMMUNITY): Payer: Self-pay

## 2016-09-04 ENCOUNTER — Ambulatory Visit (HOSPITAL_COMMUNITY)
Admission: RE | Admit: 2016-09-04 | Discharge: 2016-09-04 | Disposition: A | Discharge status: Home / Self Care | Payer: Self-pay | Source: Ambulatory Visit | Attending: Obstetrics and Gynecology | Admitting: Obstetrics and Gynecology

## 2016-09-04 ENCOUNTER — Other Ambulatory Visit (HOSPITAL_COMMUNITY): Payer: Self-pay | Admitting: Obstetrics and Gynecology

## 2016-09-04 ENCOUNTER — Ambulatory Visit
Admission: RE | Admit: 2016-09-04 | Discharge: 2016-09-04 | Disposition: A | Discharge status: Home / Self Care | Payer: No Typology Code available for payment source | Source: Ambulatory Visit | Attending: Obstetrics and Gynecology | Admitting: Obstetrics and Gynecology

## 2016-09-04 VITALS — BP 172/98 | Temp 98.4°F | Ht <= 58 in | Wt 172.2 lb

## 2016-09-04 DIAGNOSIS — N6310 Unspecified lump in the right breast, unspecified quadrant: Secondary | ICD-10-CM

## 2016-09-04 DIAGNOSIS — N6320 Unspecified lump in the left breast, unspecified quadrant: Secondary | ICD-10-CM

## 2016-09-04 DIAGNOSIS — R921 Mammographic calcification found on diagnostic imaging of breast: Secondary | ICD-10-CM

## 2016-09-04 DIAGNOSIS — Z1239 Encounter for other screening for malignant neoplasm of breast: Secondary | ICD-10-CM

## 2016-09-04 DIAGNOSIS — N6325 Unspecified lump in the left breast, overlapping quadrants: Secondary | ICD-10-CM

## 2016-09-04 HISTORY — DX: Essential (primary) hypertension: I10

## 2016-09-04 NOTE — Patient Instructions (Signed)
Explained breast self awareness to Susan [name]. Patient did not need a Pap smear today due to last Pap smear was in May 2015 per patient. Let her know [facility] will cover Pap smears every 3 years unless has a history of abnormal Pap smears. Reminded patient that her next Pap smear is due next May 2018 and to call Martie LeeSabrina to schedule with [facility]. [facility] per recommendation. Appointment scheduled for Thursday, September 04, 2016 at 1340.       Susan [name] verbalized understanding.  Levonte Molina, Kathaleen Maserhristine Poll, RN 3:16 PM

## 2016-09-04 NOTE — Progress Notes (Signed)
No complaints today. [facility] for complete follow up for right breast calcifications.   Pap Smear: Pap smear not completed today. [facility] and normal. Per patient has no history of an abnormal Pap smear. No Pap smear results are in EPIC.  Physical exam: Breasts Breasts symmetrical. No skin abnormalities bilateral breasts. No nipple retraction bilateral breasts. No nipple discharge bilateral breasts. No lymphadenopathy. No lumps palpated right breast. Palpated a pea sized lump within the left breast at 3 o'clock 4.5 cm from the nipple. No complaints of pain or tenderness on exam. Referred patient to the [facility] for diagnostic mammogram per recommendation. Appointment scheduled for Thursday, September 04, 2016 at 1340.        Pelvic/Bimanual No Pap smear completed today since last Pap smear was in May 2015 per patient. Pap smear not indicated per BCCCP guidelines.   Smoking History: Patient has never smoked.  Patient Navigation: Patient education provided. Access to services provided for [facility]. Spanish interpreter provided.  Used Spanish interpreter [employer] Mor[employer] from [employer].

## 2016-09-09 ENCOUNTER — Encounter (HOSPITAL_COMMUNITY): Payer: Self-pay | Admitting: *Deleted

## 2017-02-25 ENCOUNTER — Other Ambulatory Visit: Payer: Self-pay | Admitting: Pediatrics

## 2017-02-26 ENCOUNTER — Other Ambulatory Visit: Payer: Self-pay | Admitting: Obstetrics and Gynecology

## 2017-02-26 DIAGNOSIS — Z1231 Encounter for screening mammogram for malignant neoplasm of breast: Secondary | ICD-10-CM

## 2017-02-26 DIAGNOSIS — N6320 Unspecified lump in the left breast, unspecified quadrant: Secondary | ICD-10-CM

## 2017-03-13 ENCOUNTER — Encounter (HOSPITAL_COMMUNITY): Payer: Self-pay | Admitting: *Deleted

## 2017-04-06 ENCOUNTER — Ambulatory Visit
Admission: RE | Admit: 2017-04-06 | Discharge: 2017-04-06 | Disposition: A | Discharge status: Home / Self Care | Payer: No Typology Code available for payment source | Source: Ambulatory Visit | Attending: Obstetrics and Gynecology | Admitting: Obstetrics and Gynecology

## 2017-04-06 ENCOUNTER — Other Ambulatory Visit: Payer: Self-pay | Admitting: Obstetrics and Gynecology

## 2017-04-06 DIAGNOSIS — N6320 Unspecified lump in the left breast, unspecified quadrant: Secondary | ICD-10-CM

## 2017-04-06 DIAGNOSIS — Z1231 Encounter for screening mammogram for malignant neoplasm of breast: Secondary | ICD-10-CM

## 2017-04-13 ENCOUNTER — Emergency Department (HOSPITAL_COMMUNITY): Payer: Self-pay

## 2017-04-13 ENCOUNTER — Emergency Department (HOSPITAL_COMMUNITY)
Admission: EM | Admit: 2017-04-13 | Discharge: 2017-04-13 | Disposition: A | Discharge status: Home / Self Care | Payer: Self-pay | Attending: Physician Assistant | Admitting: Physician Assistant

## 2017-04-13 ENCOUNTER — Encounter (HOSPITAL_COMMUNITY): Payer: Self-pay

## 2017-04-13 DIAGNOSIS — Y999 Unspecified external cause status: Secondary | ICD-10-CM | POA: Insufficient documentation

## 2017-04-13 DIAGNOSIS — Z79899 Other long term (current) drug therapy: Secondary | ICD-10-CM | POA: Insufficient documentation

## 2017-04-13 DIAGNOSIS — T148XXA Other injury of unspecified body region, initial encounter: Secondary | ICD-10-CM

## 2017-04-13 DIAGNOSIS — Y939 Activity, unspecified: Secondary | ICD-10-CM | POA: Insufficient documentation

## 2017-04-13 DIAGNOSIS — I10 Essential (primary) hypertension: Secondary | ICD-10-CM | POA: Insufficient documentation

## 2017-04-13 DIAGNOSIS — S61222A Laceration with foreign body of right middle finger without damage to nail, initial encounter: Secondary | ICD-10-CM | POA: Insufficient documentation

## 2017-04-13 DIAGNOSIS — Y929 Unspecified place or not applicable: Secondary | ICD-10-CM | POA: Insufficient documentation

## 2017-04-13 DIAGNOSIS — W231XXA Caught, crushed, jammed, or pinched between stationary objects, initial encounter: Secondary | ICD-10-CM | POA: Insufficient documentation

## 2017-04-13 MED ORDER — LIDOCAINE HCL 1 % IJ SOLN
30.0000 mL | Freq: Once | INTRAMUSCULAR | Status: AC
  Administered 2017-04-13: 30 mL via INTRADERMAL
  Filled 2017-04-13: qty 40

## 2017-04-13 MED ORDER — TETANUS-DIPHTH-ACELL PERTUSSIS 5-2.5-18.5 LF-MCG/0.5 IM SUSP
0.5000 mL | Freq: Once | INTRAMUSCULAR | Status: AC
  Administered 2017-04-13: 0.5 mL via INTRAMUSCULAR
  Filled 2017-04-13: qty 0.5

## 2017-04-13 MED ORDER — LIDOCAINE HCL (PF) 1 % IJ SOLN: 30.0000 mL | Freq: Once | INTRAMUSCULAR | Status: DC

## 2017-04-13 MED ORDER — SILVER NITRATE-POT NITRATE 75-25 % EX MISC
2.0000 | Freq: Once | CUTANEOUS | Status: AC
  Administered 2017-04-13: 2 via TOPICAL
  Filled 2017-04-13: qty 2

## 2017-04-13 NOTE — ED Triage Notes (Signed)
Pt states cut 3rd finger of R hand on chair. Pt states old growth on finger. Cut is around growth. Pt with full ROM. Pt denies any numbness/tingling in finger. Pt with good cap refill. Finger continues to bleed at triage. Bleeding controlled.

## 2017-04-13 NOTE — Discharge Instructions (Signed)
You had a pyogenic granuloma removed. Please keep your splint on for 24 hours. Then he may take off and gently soak and then apply the dressing again.  Please follow up with Susan [name] in one week. Return if bleeding begins again.

## 2017-04-13 NOTE — ED Provider Notes (Signed)
[facility] Provider Note   CSN: [id number] Arrival date & time: 04/13/17  0259  By signing my name below, I, Doreatha Martin, attest that this documentation has been prepared under the direction and in the presence of Keylee Shrestha, Cindee Salt, MD. Electronically Signed: Doreatha Martin, ED Scribe. 04/13/17. 3:51 AM.     History   Chief Complaint Chief Complaint  Patient presents with  . Finger Injury  . Laceration    HPI Susan [name] is a 41 y.o. female who presents to the Emergency Department complaining of an actively bleeding wound to the right 3rd finger that occurred 3 hours ago. Pt states she has had a growth on the palmar aspect of her right 3rd finger for two months, but it began to bleed today when she bumped it on a chair. Pt reports associated moderate pain to the area that is worsened with touch. She denies weakness, additional injuries.     The history is provided by the patient. No language interpreter was used.    Past Medical History:  Diagnosis Date  . Hypertension     There are no active problems to display for this patient.   History reviewed. No pertinent surgical history.  OB History    Gravida Para Term Preterm AB Living   5 4 4   1 4    SAB TAB Ectopic Multiple Live Births     1             Home Medications    Prior to Admission medications   Medication Sig Start Date End Date Taking? Authorizing Provider  bismuth subsalicylate (PEPTO BISMOL) 262 MG/15ML suspension Take 30 mLs by mouth every 6 (six) hours as needed for indigestion.     [provider]  ciprofloxacin (CIPRO) 500 MG tablet Take 1 tablet (500 mg total) by mouth 2 (two) times daily. Patient not taking: Reported on 09/04/2016 11/07/15   Cheri Fowler, PA-C  ibuprofen (ADVIL,MOTRIN) 800 MG tablet Take 1 tablet (800 mg total) by mouth 3 (three) times daily. Patient not taking: Reported on 09/04/2016 11/07/15   Cheri Fowler, PA-C  metroNIDAZOLE (FLAGYL) 500 MG tablet Take 1  tablet (500 mg total) by mouth 3 (three) times daily. Patient not taking: Reported on 09/04/2016 11/07/15   Cheri Fowler, PA-C    Family History Family History  Problem Relation Age of Onset  . Diabetes Mother   . Diabetes Father   . Hypertension Father     Social History Social History  Substance Use Topics  . Smoking status: Never Smoker  . Smokeless tobacco: Never Used  . Alcohol use No     Allergies   Patient has no known allergies.   Review of Systems Review of Systems  Skin: Positive for wound.  Neurological: Negative for weakness.  All other systems reviewed and are negative.    Physical Exam Updated Vital Signs BP (!) 181/92 (BP Location: Left Arm)   Pulse 71   Temp 98.7 F (37.1 C) (Oral)   Resp 18   LMP 03/19/2017 (Approximate)   SpO2 100%   Physical Exam  Constitutional: She appears well-developed and well-nourished.  HENT:  Head: Normocephalic.  Eyes: Conjunctivae are normal.  Cardiovascular: Normal rate.   Pulmonary/Chest: Effort normal. No respiratory distress.  Abdominal: She exhibits no distension.  Musculoskeletal: Normal range of motion.  Neurological: She is alert.  Skin: Skin is warm and dry.  2 cm laceration distal to a 1 cm round growth. (see photo)  Psychiatric: She has  a normal mood and affect. Her behavior is normal.  Nursing note and vitals reviewed.        ED Treatments / Results   DIAGNOSTIC STUDIES: Oxygen Saturation is 100% on RA, normal by my interpretation.    COORDINATION OF CARE: 3:44 AM Discussed treatment plan with pt at bedside which includes XR, wound care and pt agreed to plan.    Labs (all labs ordered are listed, but only abnormal results are displayed) Labs Reviewed - No data to display  EKG  EKG Interpretation None       Radiology Dg Hand Complete Right  Result Date: 04/13/2017 CLINICAL DATA:  Right middle finger laceration tonight. EXAM: RIGHT HAND - COMPLETE 3+ VIEW COMPARISON:  None.  FINDINGS: Soft tissue injury demonstrated to the mid/ distal right third finger. No radiopaque soft tissue foreign bodies. No underlying bone abnormality. No evidence of acute fracture or dislocation. No focal bone lesion or bone destruction. IMPRESSION: Soft tissue injury to the right third finger. No acute bony abnormalities. Electronically Signed   By: Burman Nieves M.D.   On: 04/13/2017 04:15    Procedures- REMOVAL OF FINGER MASS .Foreign Body Removal Date/Time: 04/13/2017 5:53 AM Performed by: Bary Castilla LYN Authorized by: Bary Castilla LYN  Consent: Verbal consent obtained. Written consent not obtained. Risks and benefits: risks, benefits and alternatives were discussed Consent given by: patient Patient understanding: patient states understanding of the procedure being performed Patient identity confirmed: verbally with patient and arm band Time out: Immediately prior to procedure a "time out" was called to verify the correct patient, procedure, equipment, support staff and site/side marked as required. Intake: finger. Anesthesia: nerve block  Sedation: Patient sedated: no Patient restrained: no Complexity: simple 1 objects recovered. Objects recovered: pyogenic graunula removed with 11 blade, silver nitrate applied.    (including critical care time)  Medications Ordered in ED Medications  Tdap (BOOSTRIX) injection 0.5 mL (0.5 mLs Intramuscular Given 04/13/17 0530)  lidocaine (XYLOCAINE) 1 % (with pres) injection 30 mL (30 mLs Intradermal Given by Other 04/13/17 0531)  silver nitrate applicators applicator 2 Stick (2 Sticks Topical Given by Other 04/13/17 0531)     Initial Impression / Assessment and Plan / ED Course  I have reviewed the triage vital signs and the nursing notes.  Pertinent labs & imaging results that were available during my care of the patient were reviewed by me and considered in my medical decision making (see chart for details).     I  personally performed the services described in this documentation, which was scribed in my presence. The recorded information has been reviewed and is accurate.   Patient's pleasant 41 year old female who knocked her hand against a chair and it started bleeding. Patient has pedunculated vascular growth that appears to be bleeding. Discussed with Janee Morn and showed him an image.  He thinks it is likely pyogenic granuloma nad recommended digital block and removal.  Digital block performed and removal pyogenic granuloma at the base of the pedunculated stalk completed. Silver nitrate used to control bleeding.  We'll refer for follow-up for hand surgery.  Marland Kitchen  NERVE BLOCK Performed by: Arlana Hove Consent: Verbal consent obtained. Required items: required blood products, implants, devices, and special equipment available Time out: Immediately prior to procedure a "time out" was called to verify the correct patient, procedure, equipment, support staff and site/side marked as required.  Indication: removal of graunulomoa Nerve block body site: right middle finger  Preparation: Patient was prepped and draped  in the usual sterile fashion. Needle gauge: 25 G Location technique: anatomical landmarks  Local anesthetic: 1 %  Anesthetic total: 10 ml  Outcome: pain improved Patient tolerance: Patient tolerated the procedure well with no immediate complications.   Final Clinical Impressions(s) / ED Diagnoses   Final diagnoses:  None    New Prescriptions New Prescriptions   No medications on file      Abelino DerrickMackuen, Jamerius Boeckman Lyn, MD 04/13/17 [phone]

## 2017-04-13 NOTE — ED Notes (Signed)
Supplies at bedside.

## 2017-09-30 ENCOUNTER — Other Ambulatory Visit: Payer: Self-pay | Admitting: Obstetrics and Gynecology

## 2017-09-30 DIAGNOSIS — Z1231 Encounter for screening mammogram for malignant neoplasm of breast: Secondary | ICD-10-CM

## 2017-10-08 ENCOUNTER — Encounter (HOSPITAL_COMMUNITY): Payer: Self-pay

## 2017-10-08 ENCOUNTER — Ambulatory Visit
Admission: RE | Admit: 2017-10-08 | Discharge: 2017-10-08 | Disposition: A | Discharge status: Home / Self Care | Payer: No Typology Code available for payment source | Source: Ambulatory Visit | Attending: Obstetrics and Gynecology | Admitting: Obstetrics and Gynecology

## 2017-10-08 ENCOUNTER — Ambulatory Visit (HOSPITAL_COMMUNITY)
Admission: RE | Admit: 2017-10-08 | Discharge: 2017-10-08 | Disposition: A | Discharge status: Home / Self Care | Payer: Self-pay | Source: Ambulatory Visit | Attending: Obstetrics and Gynecology | Admitting: Obstetrics and Gynecology

## 2017-10-08 VITALS — BP 106/72 | Temp 98.2°F | Ht 59.0 in | Wt 177.4 lb

## 2017-10-08 DIAGNOSIS — Z1231 Encounter for screening mammogram for malignant neoplasm of breast: Secondary | ICD-10-CM

## 2017-10-08 DIAGNOSIS — Z01419 Encounter for gynecological examination (general) (routine) without abnormal findings: Secondary | ICD-10-CM

## 2017-10-08 NOTE — Progress Notes (Signed)
No complaints today.   Pap Smear: Pap smear completed today. [facility] and normal. Per patient has no history of an abnormal Pap smear. No Pap smear results are in EPIC.  Physical exam: Breasts Breasts symmetrical. No skin abnormalities bilateral breasts. No nipple retraction bilateral breasts. No nipple discharge bilateral breasts. No lymphadenopathy. No lumps palpated bilateral breasts. No complaints of pain or tenderness on exam. [facility] screening mamm[facility]. Appointment scheduled for Thursday, October 08, 2017 at 1240.  Pelvic/Bimanual   Ext Genitalia No lesions, no swelling and no discharge observed on external genitalia.         Vagina Vagina pink and normal texture. No lesions and blood observed in vagina consistent with patient's menstrual cycle.          Cervix Cervix is present. Cervix pink and of normal texture. Blood observed on cervical os consistent with patient's menstrual cycle.Marland Kitchen.     Uterus Uterus is present and palpable. Uterus in normal position and normal size.        Adnexae Bilateral ovaries present and palpable. No tenderness on palpation.          Rectovaginal No rectal exam completed today since patient had no rectal complaints. No skin abnormalities observed on exam.    Smoking History: Patient has never smoked.  Patient Navigation: Patient education provided. Access to services provided [facility]. Spanish interpreter provided.  Used Spanish interpreter [employer] Mc[employer]ynolds from [employer].

## 2017-10-08 NOTE — Patient Instructions (Signed)
Explained breast self awareness with Susan [name]. Let patient know [facility]CP will cover Pap smears and HPV typing every 5 years unless has a history of abnormal Pap smears. [facility]. Appointment scheduled for Thursday, October 08, 2017 at 1240. Let patient know will follow up with her within the next couple weeks with results of Pap smear by letter or phone. Informed patient that the Breast Center will follow up with her within the next couple of weeks with results of mammogram by letter or phone. Susan [name] verbalized understanding.  Roxie Gueye, Kathaleen Maserhristine Poll, RN 2:11 PM

## 2017-10-09 ENCOUNTER — Encounter (HOSPITAL_COMMUNITY): Payer: Self-pay

## 2017-10-12 LAB — CYTOLOGY - PAP
Diagnosis: NEGATIVE
HPV: NOT DETECTED

## 2017-11-02 ENCOUNTER — Encounter (HOSPITAL_COMMUNITY): Payer: Self-pay | Admitting: *Deleted

## 2017-11-02 NOTE — Progress Notes (Signed)
Letter mailed with negative pap smear results and HPV was negative.

## 2018-07-28 ENCOUNTER — Encounter (HOSPITAL_COMMUNITY): Payer: Self-pay | Admitting: Emergency Medicine

## 2018-07-28 ENCOUNTER — Ambulatory Visit (HOSPITAL_COMMUNITY)
Admission: EM | Admit: 2018-07-28 | Discharge: 2018-07-28 | Disposition: A | Discharge status: Home / Self Care | Payer: No Typology Code available for payment source | Attending: Family Medicine | Admitting: Family Medicine

## 2018-07-28 ENCOUNTER — Other Ambulatory Visit: Payer: Self-pay

## 2018-07-28 DIAGNOSIS — L03313 Cellulitis of chest wall: Secondary | ICD-10-CM

## 2018-07-28 DIAGNOSIS — N6322 Unspecified lump in the left breast, upper inner quadrant: Secondary | ICD-10-CM

## 2018-07-28 DIAGNOSIS — N6310 Unspecified lump in the right breast, unspecified quadrant: Secondary | ICD-10-CM

## 2018-07-28 MED ORDER — MELOXICAM 7.5 MG PO TABS: 7.5000 mg | ORAL_TABLET | Freq: Every day | ORAL | 0 refills | Status: DC

## 2018-07-28 MED ORDER — CEPHALEXIN 500 MG PO CAPS: 500.0000 mg | ORAL_CAPSULE | Freq: Four times a day (QID) | ORAL | 0 refills | Status: DC

## 2018-07-28 NOTE — Discharge Instructions (Signed)
Start Keflex as directed.  Warm compress.  I have put in order for new mammogram/ultrasound for the breast mass.  You will receive a call for scheduling.  Please follow-up with PCP for further evaluation and monitoring after testing is done.

## 2018-07-28 NOTE — ED Triage Notes (Signed)
Onset Saturday of right breast pain.  Patient reports a bump and redness.  To right breast

## 2018-07-28 NOTE — ED Provider Notes (Signed)
[facility]    CSN: [id number] Arrival date & time: 07/28/18  1308     History   Chief Complaint Chief Complaint  Patient presents with  . Breast Pain    HPI Susan [name] is a 42 y.o. female.   42 year old female comes in for 4 day history of right breast mass with erythema, warmth, pain. 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 She was having every 6 months mammograms, and now was told to get one every year. Her last mammogram was 08/2017. States usually can feel small masses that resolves on own. This current mass was felt 4 days ago and has not increased in size, but has had erythema and warmth to it. Denies fever, chills, night sweats. Denies skin changes, nipple discharge. Denies personal or family history of breast cancer. Has a mammogram scheduled for 08/2018, but called to have it moved forward when she started feeling current mass and was told a new order needs to be placed.       Past Medical History:  Diagnosis Date  . Hypertension     There are no active problems to display for this patient.   History reviewed. No pertinent surgical history.  OB History    Gravida  5   Para  4   Term  4   Preterm      AB  1   Living  4     SAB      TAB  1   Ectopic      Multiple      Live Births               Home Medications    Prior to Admission medications   Medication Sig Start Date End Date Taking? Authorizing Provider  bismuth subsalicylate (PEPTO BISMOL) 262 MG/15ML suspension Take 30 mLs by mouth every 6 (six) hours as needed for indigestion.     [provider]  cephALEXin (KEFLEX) 500 MG capsule Take 1 capsule (500 mg total) by mouth 4 (four) times daily. 07/28/18   Cathie HoopsYu, Jamelle Noy V, PA-C  meloxicam (MOBIC) 7.5 MG tablet Take 1 tablet (7.5 mg total) by mouth daily. 07/28/18   Belinda FisherYu, Toluwani Yadav V, PA-C    Family History Family History  Problem Relation Age of Onset  . Diabetes Mother   . Diabetes Father    . Hypertension Father     Social History Social History   Tobacco Use  . Smoking status: Never Smoker  . Smokeless tobacco: Never Used  Substance Use Topics  . Alcohol use: No  . Drug use: No     Allergies   Patient has no known allergies.   Review of Systems Review of Systems  Reason unable to perform ROS: See HPI as above.     Physical Exam Triage Vital Signs ED Triage Vitals  Enc Vitals Group     BP 07/28/18 1332 (!) 174/97     Pulse Rate 07/28/18 1332 88     Resp 07/28/18 1332 16     Temp 07/28/18 1332 99.1 F (37.3 C)     Temp Source 07/28/18 1332 Oral     SpO2 07/28/18 1332 97 %     Weight --      Height --      Head Circumference --      Peak Flow --      Pain Score 07/28/18 1333 8     Pain  Loc --      Pain Edu? --      Excl. in GC? --    No data found.  Updated Vital Signs BP (!) 174/97 (BP Location: Left Arm) Comment: reported BP to Nurse Kim Lapan-Hutchens. patient stated that she has high BP  Pulse 88   Temp 99.1 F (37.3 C) (Oral)   Resp 16   LMP 07/24/2018   SpO2 97%   Physical Exam  Constitutional: She is oriented to person, place, and time. She appears well-developed and well-nourished. No distress.  HENT:  Head: Normocephalic and atraumatic.  Eyes: Pupils are equal, round, and reactive to light. Conjunctivae are normal.  Pulmonary/Chest: Effort normal. No accessory muscle usage. No respiratory distress.  5cm x 3cm induration vs mass felt. With erythema and warmth to the area. Mild tenderness to palpation. No fluctuance felt. No nipple discharge. No axillary lymph nodes felt.     Neurological: She is alert and oriented to person, place, and time.  Skin: She is not diaphoretic.     UC Treatments / Results  Labs (all labs ordered are listed, but only abnormal results are displayed) Labs Reviewed - No data to display  EKG None  Radiology No results found.  Procedures Procedures (including critical care  time)  Medications Ordered in UC Medications - No data to display  Initial Impression / Assessment and Plan / UC Course  I have reviewed the triage vital signs and the nursing notes.  Pertinent labs & imaging results that were available during my care of the patient were reviewed by me and considered in my medical decision making (see chart for details).    Will start keflex for cellulitis. Warm compress. Patient to follow up with mammogram/ultrasound of breast. Return precautions given. Otherwise, follow up with PCP for continued monitoring needed.  Final Clinical Impressions(s) / UC Diagnoses   Final diagnoses:  Cellulitis of chest wall  Breast mass, right    ED Prescriptions    Medication Sig Dispense Auth. Provider   cephALEXin (KEFLEX) 500 MG capsule Take 1 capsule (500 mg total) by mouth 4 (four) times daily. 28 capsule Azrielle Springsteen V, PA-C   meloxicam (MOBIC) 7.5 MG tablet Take 1 tablet (7.5 mg total) by mouth daily. 15 tablet Threasa AlphaYu, Ether Wolters V, PA-C        Carlisia Geno V, [place]C 07/28/18 1406

## 2018-12-22 ENCOUNTER — Other Ambulatory Visit (HOSPITAL_COMMUNITY): Payer: Self-pay | Admitting: *Deleted

## 2018-12-22 DIAGNOSIS — Z1231 Encounter for screening mammogram for malignant neoplasm of breast: Secondary | ICD-10-CM

## 2019-03-08 ENCOUNTER — Other Ambulatory Visit (HOSPITAL_COMMUNITY): Payer: Self-pay | Admitting: *Deleted

## 2019-03-08 DIAGNOSIS — Z1231 Encounter for screening mammogram for malignant neoplasm of breast: Secondary | ICD-10-CM

## 2019-03-10 ENCOUNTER — Ambulatory Visit (HOSPITAL_COMMUNITY): Payer: Self-pay

## 2019-07-14 ENCOUNTER — Ambulatory Visit (HOSPITAL_COMMUNITY): Payer: Self-pay

## 2019-08-10 ENCOUNTER — Other Ambulatory Visit (HOSPITAL_COMMUNITY): Payer: Self-pay | Admitting: *Deleted

## 2019-08-10 DIAGNOSIS — Z1231 Encounter for screening mammogram for malignant neoplasm of breast: Secondary | ICD-10-CM

## 2019-08-11 ENCOUNTER — Ambulatory Visit (HOSPITAL_COMMUNITY): Payer: Self-pay

## 2019-09-27 ENCOUNTER — Ambulatory Visit (HOSPITAL_COMMUNITY): Payer: Self-pay

## 2019-10-04 ENCOUNTER — Other Ambulatory Visit (HOSPITAL_COMMUNITY)
Admission: RE | Admit: 2019-10-04 | Discharge: 2019-10-04 | Disposition: A | Discharge status: Home / Self Care | Payer: Self-pay | Source: Ambulatory Visit | Attending: Primary Care | Admitting: Primary Care

## 2019-10-04 ENCOUNTER — Ambulatory Visit (INDEPENDENT_AMBULATORY_CARE_PROVIDER_SITE_OTHER): Payer: Self-pay | Admitting: Primary Care

## 2019-10-04 ENCOUNTER — Encounter (INDEPENDENT_AMBULATORY_CARE_PROVIDER_SITE_OTHER): Payer: Self-pay | Admitting: Primary Care

## 2019-10-04 ENCOUNTER — Other Ambulatory Visit: Payer: Self-pay

## 2019-10-04 VITALS — BP 189/82 | HR 84 | Temp 97.3°F | Ht 59.0 in | Wt 176.4 lb

## 2019-10-04 DIAGNOSIS — N912 Amenorrhea, unspecified: Secondary | ICD-10-CM

## 2019-10-04 DIAGNOSIS — Z114 Encounter for screening for human immunodeficiency virus [HIV]: Secondary | ICD-10-CM

## 2019-10-04 DIAGNOSIS — Z7689 Persons encountering health services in other specified circumstances: Secondary | ICD-10-CM

## 2019-10-04 DIAGNOSIS — Z833 Family history of diabetes mellitus: Secondary | ICD-10-CM

## 2019-10-04 DIAGNOSIS — R300 Dysuria: Secondary | ICD-10-CM

## 2019-10-04 DIAGNOSIS — Z6835 Body mass index (BMI) 35.0-35.9, adult: Secondary | ICD-10-CM

## 2019-10-04 DIAGNOSIS — N898 Other specified noninflammatory disorders of vagina: Secondary | ICD-10-CM

## 2019-10-04 DIAGNOSIS — E119 Type 2 diabetes mellitus without complications: Secondary | ICD-10-CM

## 2019-10-04 DIAGNOSIS — Z23 Encounter for immunization: Secondary | ICD-10-CM

## 2019-10-04 DIAGNOSIS — I10 Essential (primary) hypertension: Secondary | ICD-10-CM

## 2019-10-04 DIAGNOSIS — Z3202 Encounter for pregnancy test, result negative: Secondary | ICD-10-CM

## 2019-10-04 LAB — POCT URINALYSIS DIP (CLINITEK)
Bilirubin, UA: NEGATIVE
Glucose, UA: 500 mg/dL — AB
Ketones, POC UA: NEGATIVE mg/dL
Nitrite, UA: NEGATIVE
POC PROTEIN,UA: NEGATIVE
Spec Grav, UA: 1.02 (ref 1.010–1.025)
Urobilinogen, UA: 1 E.U./dL
pH, UA: 6.5 (ref 5.0–8.0)

## 2019-10-04 LAB — POCT GLYCOSYLATED HEMOGLOBIN (HGB A1C): Hemoglobin A1C: 10.8 % — AB (ref 4.0–5.6)

## 2019-10-04 LAB — POCT URINE PREGNANCY: Preg Test, Ur: NEGATIVE

## 2019-10-04 MED ORDER — HYDROCHLOROTHIAZIDE 25 MG PO TABS: 25.0000 mg | ORAL_TABLET | Freq: Every day | ORAL | 3 refills | Status: DC

## 2019-10-04 MED ORDER — GLIPIZIDE 10 MG PO TABS: 10.0000 mg | ORAL_TABLET | Freq: Two times a day (BID) | ORAL | 3 refills | Status: DC

## 2019-10-04 MED ORDER — LISINOPRIL 10 MG PO TABS: 10.0000 mg | ORAL_TABLET | Freq: Every day | ORAL | 3 refills | Status: DC

## 2019-10-04 MED ORDER — METFORMIN HCL 1000 MG PO TABS: 1000.0000 mg | ORAL_TABLET | Freq: Two times a day (BID) | ORAL | 3 refills | Status: DC

## 2019-10-04 MED ORDER — ATORVASTATIN CALCIUM 20 MG PO TABS: 20.0000 mg | ORAL_TABLET | Freq: Every day | ORAL | 3 refills | Status: DC

## 2019-10-04 NOTE — Progress Notes (Signed)
Pt complains of dizziness Irritation in vaginal area Cracking/gas noise in stomach   She has agreed to flu vaccine

## 2019-10-04 NOTE — Progress Notes (Signed)
New Patient Office Visit  Subjective:  Patient ID: Susan [name], female    DOB: 10/23/1976  Age: 43 y.o. MRN: [id number]  CC:  Chief Complaint  Patient presents with  . New Patient (Initial Visit)    dizziness  . vaginal irritation    HPI [name] presents for establishment of care. She voices concerns of dizziness 2 episodes self resloved and vaginal discharge that is malodorous and burns with urination at times.  Past Medical History:  Diagnosis Date  . Hypertension     History reviewed. No pertinent surgical history.  Family History  Problem Relation Age of Onset  . Diabetes Mother   . Diabetes Father   . Hypertension Father     Social History   Socioeconomic History  . Marital status: Single    Spouse name: Not on file  . Number of children: Not on file  . Years of education: Not on file  . Highest education level: Not on file  Occupational History  . Not on file  Social Needs  . Financial resource strain: Not on file  . Food insecurity    Worry: Not on file    Inability: Not on file  . Transportation needs    Medical: Not on file    Non-medical: Not on file  Tobacco Use  . Smoking status: Never Smoker  . Smokeless tobacco: Never Used  Substance and Sexual Activity  . Alcohol use: No  . Drug use: No  . Sexual activity: Yes    Birth control/protection: Condom  Lifestyle  . Physical activity    Days per week: Not on file    Minutes per session: Not on file  . Stress: Not on file  Relationships  . Social Herbalist on phone: Not on file    Gets together: Not on file    Attends religious service: Not on file    Active member of club or organization: Not on file    Attends meetings of clubs or organizations: Not on file    Relationship status: Not on file  . Intimate partner violence    Fear of current or ex partner: Not on file    Emotionally abused: Not on file    Physically abused: Not on file    Forced sexual activity:  Not on file  Other Topics Concern  . Not on file  Social History Narrative  . Not on file    ROS Review of Systems  Endocrine: Positive for polydipsia, polyphagia and polyuria.  Genitourinary: Positive for frequency and vaginal discharge.  All other systems reviewed and are negative.   Objective:   Today's Vitals: BP (!) 189/82 (BP Location: Right Arm, Patient Position: Sitting, Cuff Size: Large)   Pulse 84   Temp (!) 97.3 F (36.3 C) (Temporal)   Ht _0  (1.499 m)   Wt 176 lb 6.4 oz (80 kg)   LMP 09/20/2019 (Approximate)   SpO2 100%   BMI 35.63 kg/m   Physical Exam Vitals signs reviewed.  Constitutional:      Appearance: Normal appearance. She is obese.  HENT:     Head: Normocephalic.  Eyes:     Extraocular Movements: Extraocular movements intact.     Pupils: Pupils are equal, round, and reactive to light.  Neck:     Musculoskeletal: Normal range of motion and neck supple.  Cardiovascular:     Rate and Rhythm: Normal rate and regular rhythm.  Pulmonary:  Effort: Pulmonary effort is normal.     Breath sounds: Normal breath sounds.  Abdominal:     General: Bowel sounds are normal.     Palpations: Abdomen is soft.  Musculoskeletal: Normal range of motion.  Skin:    General: Skin is warm.  Neurological:     Mental Status: She is oriented to person, place, and time.  Psychiatric:        Mood and Affect: Mood normal.     Assessment & Plan:   Problem List Items Addressed This Visit    None    Visit Diagnoses    Encounter to establish care    -  Primary   Relevant Orders   CBC with Differential/Platelet (Completed)   CMP14+EGFR (Completed)   BMI 35.0-35.9,adult       Vaginal discharge       Relevant Orders   Cervicovaginal ancillary only   Dysuria       Relevant Orders   POCT URINALYSIS DIP (CLINITEK) (Completed)   Family history of diabetes mellitus (DM)       Relevant Orders   Lipid panel (Completed)   HgB A1c (Completed)   Encounter for  screening for HIV       Relevant Orders   HIV antibody (with reflex) (Completed)   Essential hypertension       Relevant Medications   hydrochlorothiazide (HYDRODIURIL) 25 MG tablet   lisinopril (ZESTRIL) 10 MG tablet   atorvastatin (LIPITOR) 20 MG tablet   Amenorrhea       Relevant Orders   POCT urine pregnancy (Completed)   Type 2 diabetes mellitus without complication, without long-term current use of insulin (HCC)       Relevant Medications   metFORMIN (GLUCOPHAGE) 1000 MG tablet   glipiZIDE (GLUCOTROL) 10 MG tablet   lisinopril (ZESTRIL) 10 MG tablet   atorvastatin (LIPITOR) 20 MG tablet   Need for immunization against influenza       Relevant Orders   Flu Vaccine QUAD 36+ mos IM (Completed)      Aniylah was seen today for new patient (initial visit) and vaginal irritation.  Diagnoses and all orders for this visit:  Encounter to establish care -     CBC with Differential/Platelet; Future -     CMP14+EGFR; Future -     CMP14+EGFR -     CBC with Differential/Platelet  BMI 35.0-35.9,adult -     Cancel: Hemoglobin A1c; Future  Vaginal discharge -     Cervicovaginal ancillary only  Dysuria -     POCT URINALYSIS DIP (CLINITEK)  Family history of diabetes mellitus (DM) -     Lipid panel; Future -     HgB A1c 10.8 -     Lipid panel  Encounter for screening for HIV Screening health maintenance  -     HIV antibody (with reflex)  Essential hypertension Elevated and new diagnosis of diabetes added lisinopril 8m daily. Counseled on blood pressure goal of less than 130/80, low-sodium, DASH diet, medication compliance, 150 minutes of moderate intensity exercise per week. Discussed medication compliance, adverse effects.  Amenorrhea -     POCT urine pregnancy  Type 2 diabetes mellitus without complication, without long-term current use of insulin ([place] Patient had gestational diabetes and has not been check/seen today A1C 10.8. Discussed foods that are high in  carbohydrates are the following rice, potatoes, breads, sugars, and pastas.  Reduction in the intake (eating) will assist in lowering your blood sugars.  Need for immunization against  influenza -     Flu Vaccine QUAD 36+ mos IM  Other orders -     hydrochlorothiazide (HYDRODIURIL) 25 MG tablet; Take 1 tablet (25 mg total) by mouth daily. -     metFORMIN (GLUCOPHAGE) 1000 MG tablet; Take 1 tablet (1,000 mg total) by mouth 2 (two) times daily with a meal. -     glipiZIDE (GLUCOTROL) 10 MG tablet; Take 1 tablet (10 mg total) by mouth 2 (two) times daily before a meal. -     lisinopril (ZESTRIL) 10 MG tablet; Take 1 tablet (10 mg total) by mouth daily. -     atorvastatin (LIPITOR) 20 MG tablet; Take 1 tablet (20 mg total) by mouth daily.   Follow-up: Return in about 9 weeks (around 12/06/2019) for 8-10 weeks Bp f/u started on Bp medications.   Kerin Perna, NP

## 2019-10-04 NOTE — Patient Instructions (Signed)
Hipertensin en los adultos Hypertension, Adult El trmino hipertensin es otra forma de denominar a la presin arterial elevada. La presin arterial elevada fuerza al corazn a trabajar ms para bombear la sangre. Esto puede causar problemas con el paso del tiempo. Una lectura de presin arterial est compuesta por 2 nmeros. Hay un nmero superior (sistlico) sobre un nmero inferior (diastlico). Lo ideal es tener la presin arterial por debajo de 120/80. Las elecciones saludables pueden ayudar a bajar la presin arterial, o tal vez necesite medicamentos para bajarla. Cules son las causas? Se desconoce la causa de esta afeccin. Algunas afecciones pueden estar relacionadas con la presin arterial alta. Qu incrementa el riesgo?  Fumar.  Tener diabetes mellitus tipo 2, colesterol alto, o ambos.  No hacer la cantidad suficiente de actividad fsica o ejercicio.  Tener sobrepeso.  Consumir mucha grasa, azcar, caloras o sal (sodio) en su dieta.  Beber alcohol en exceso.  Tener una enfermedad renal a largo plazo (crnica).  Tener antecedentes familiares de presin arterial alta.  Edad. Los riesgos aumentan con la edad.  Raza. El riesgo es mayor para las personas afroamericanas.  Sexo. Antes de los 45aos, los hombres corren ms riesgo que las mujeres. Despus de los 65aos, las mujeres corren ms riesgo que los hombres.  Tener apnea obstructiva del sueo.  Estrs. Cules son los signos o los sntomas?  Es posible que la presin arterial alta puede no cause sntomas. La presin arterial muy alta (crisis hipertensiva) puede provocar: ? Dolor de cabeza. ? Sensaciones de preocupacin o nerviosismo (ansiedad). ? Falta de aire. ? Hemorragia nasal. ? Sensacin de malestar en el estmago (nuseas). ? Vmitos. ? Cambios en la forma de ver. ? Dolor muy intenso en el pecho. ? Convulsiones. Cmo se trata?  Esta afeccin se trata haciendo cambios saludables en el estilo de  vida, por ejemplo: ? Consumir alimentos saludables. ? Hacer ms ejercicio. ? Beber menos alcohol.  El mdico puede recetarle medicamentos si los cambios en el estilo de vida no son suficientes para lograr controlar la presin arterial y si: ? El nmero de arriba est por encima de 130. ? El nmero de abajo est por encima de 80.  Su presin arterial personal ideal puede variar. Siga estas instrucciones en su casa: Comida y bebida   Si se lo dicen, siga el plan de alimentacin de DASH (Dietary Approaches to Stop Hypertension, Maneras de alimentarse para detener la hipertensin). Para seguir este plan: ? Llene la mitad del plato de cada comida con frutas y verduras. ? Llene un cuarto del plato de cada comida con cereales integrales. Los cereales integrales incluyen pasta integral, arroz integral y pan integral. ? Coma y beba productos lcteos con bajo contenido de grasa, como leche descremada o yogur bajo en grasas. ? Llene un cuarto del plato de cada comida con protenas bajas en grasa (magras). Las protenas bajas en grasa incluyen pescado, pollo sin piel, huevos, frijoles y tofu. ? Evite consumir carne grasa, carne curada y procesada, o pollo con piel. ? Evite consumir alimentos prehechos o procesados.  Consuma menos de 1500 mg de sal por da.  No beba alcohol si: ? El mdico le indica que no lo haga. ? Est embarazada, puede estar embarazada o est tratando de quedar embarazada.  Si bebe alcohol: ? Limite la cantidad que bebe a lo siguiente:  De 0 a 1 medida por da para las mujeres.  De 0 a 2 medidas por da para los hombres. ? Est atento a   la cantidad de alcohol que hay en las bebidas que toma. En los [place], una medida equivale a una botella de cerveza de 12oz (355ml), un vaso de vino de 5oz (148ml) o un vaso de una bebida alcohlica de alta graduacin de 1oz (44ml). Estilo de vida   Trabaje con su mdico para mantenerse en un peso saludable o para perder  peso. Pregntele a su mdico cul es el peso recomendable para usted.  Haga al menos 30minutos de ejercicio la mayora de los das de la semana. Estos pueden incluir caminar, nadar o andar en bicicleta.  Realice al menos 30 minutos de ejercicio que fortalezca sus msculos (ejercicios de resistencia) al menos 3 das a la semana. Estos pueden incluir levantar pesas o hacer Pilates.  No consuma ningn producto que contenga nicotina o tabaco, como cigarrillos, cigarrillos electrnicos y tabaco de mascar. Si necesita ayuda para dejar de fumar, consulte al mdico.  Controle su presin arterial en su casa tal como le indic el mdico.  Concurra a todas las visitas de seguimiento como se lo haya indicado el mdico. Esto es importante. Medicamentos  Tome los medicamentos de venta libre y los recetados solamente como se lo haya indicado el mdico. Siga cuidadosamente las indicaciones.  No omita las dosis de medicamentos para la presin arterial. Los medicamentos pierden eficacia si omite dosis. El hecho de omitir las dosis tambin aumenta el riesgo de otros problemas.  Pregntele a su mdico a qu efectos secundarios o reacciones a los medicamentos debe prestar atencin. Comunquese con un mdico si:  Piensa que tiene una reaccin a los medicamentos que est tomando.  Tiene dolores de cabeza frecuentes (recurrentes).  Se siente mareado.  Tiene hinchazn en los tobillos.  Tiene problemas de visin. Solicite ayuda inmediatamente si:  Siente un dolor de cabeza muy intenso.  Empieza a sentirse desorientado (confundido).  Se siente dbil o adormecido.  Siente que va a desmayarse.  Tiene un dolor muy intenso en las siguientes zonas: ? Pecho. ? Vientre (abdomen).  Vomita ms de una vez.  Tiene dificultad para respirar. Resumen  El trmino hipertensin es otra forma de denominar a la presin arterial elevada.  La presin arterial elevada fuerza al corazn a trabajar ms para bombear  la sangre.  Para la mayora de las personas, una presin arterial normal es menor que 120/80.  Las decisiones saludables pueden ayudarle a disminuir su presin arterial. Si no puede bajar su presin arterial mediante decisiones saludables, es posible que deba tomar medicamentos. Esta informacin no tiene como fin reemplazar el consejo del mdico. Asegrese de hacerle al mdico cualquier pregunta que tenga. Document Released: 05/14/2010 Document Revised: 09/09/2018 Document Reviewed: 09/09/2018 Elsevier Patient Education  2020 Elsevier Inc.  

## 2019-10-05 LAB — CMP14+EGFR
ALT: 160 IU/L — ABNORMAL HIGH (ref 0–32)
AST: 165 IU/L — ABNORMAL HIGH (ref 0–40)
Albumin/Globulin Ratio: 1.3 (ref 1.2–2.2)
Albumin: 4.4 g/dL (ref 3.8–4.8)
Alkaline Phosphatase: 126 IU/L — ABNORMAL HIGH (ref 39–117)
BUN/Creatinine Ratio: 11 (ref 9–23)
BUN: 7 mg/dL (ref 6–24)
Bilirubin Total: <0.2 mg/dL (ref 0.0–1.2)
CO2: 23 mmol/L (ref 20–29)
Calcium: 9.2 mg/dL (ref 8.7–10.2)
Chloride: 100 mmol/L (ref 96–106)
Creatinine, Ser: 0.64 mg/dL (ref 0.57–1.00)
GFR calc Af Amer: 126 mL/min/{1.73_m2} (ref >59)
GFR calc non Af Amer: 109 mL/min/{1.73_m2} (ref >59)
Globulin, Total: 3.4 g/dL (ref 1.5–4.5)
Glucose: 345 mg/dL — ABNORMAL HIGH (ref 65–99)
Potassium: 4.3 mmol/L (ref 3.5–5.2)
Sodium: 134 mmol/L (ref 134–144)
Total Protein: 7.8 g/dL (ref 6.0–8.5)

## 2019-10-05 LAB — LIPID PANEL
Chol/HDL Ratio: 6.4 ratio — ABNORMAL HIGH (ref 0.0–4.4)
Cholesterol, Total: 206 mg/dL — ABNORMAL HIGH (ref 100–199)
HDL: 32 mg/dL — ABNORMAL LOW (ref >39)
LDL Chol Calc (NIH): 135 mg/dL — ABNORMAL HIGH (ref 0–99)
Triglycerides: 215 mg/dL — ABNORMAL HIGH (ref 0–149)
VLDL Cholesterol Cal: 39 mg/dL (ref 5–40)

## 2019-10-05 LAB — CBC WITH DIFFERENTIAL/PLATELET
Basophils Absolute: 0.1 10*3/uL (ref 0.0–0.2)
Basos: 1 %
EOS (ABSOLUTE): 0.1 10*3/uL (ref 0.0–0.4)
Eos: 1 %
Hematocrit: 39.7 % (ref 34.0–46.6)
Hemoglobin: 13 g/dL (ref 11.1–15.9)
Immature Grans (Abs): 0 10*3/uL (ref 0.0–0.1)
Immature Granulocytes: 0 %
Lymphocytes Absolute: 4.5 10*3/uL — ABNORMAL HIGH (ref 0.7–3.1)
Lymphs: 38 %
MCH: 27.4 pg (ref 26.6–33.0)
MCHC: 32.7 g/dL (ref 31.5–35.7)
MCV: 84 fL (ref 79–97)
Monocytes Absolute: 0.8 10*3/uL (ref 0.1–0.9)
Monocytes: 6 %
Neutrophils Absolute: 6.4 10*3/uL (ref 1.4–7.0)
Neutrophils: 54 %
Platelets: 217 10*3/uL (ref 150–450)
RBC: 4.74 x10E6/uL (ref 3.77–5.28)
RDW: 13.1 % (ref 11.7–15.4)
WBC: 11.8 10*3/uL — ABNORMAL HIGH (ref 3.4–10.8)

## 2019-10-05 LAB — HIV ANTIBODY (ROUTINE TESTING W REFLEX): HIV Screen 4th Generation wRfx: NONREACTIVE

## 2019-10-11 LAB — CERVICOVAGINAL ANCILLARY ONLY
Bacterial Vaginitis (gardnerella): POSITIVE — AB
Candida Glabrata: NEGATIVE
Candida Vaginitis: POSITIVE — AB
Chlamydia: NEGATIVE
Comment: NEGATIVE
Comment: NEGATIVE
Comment: NEGATIVE
Comment: NEGATIVE
Comment: NEGATIVE
Comment: NORMAL
Neisseria Gonorrhea: NEGATIVE
Trichomonas: NEGATIVE

## 2019-10-12 ENCOUNTER — Other Ambulatory Visit (INDEPENDENT_AMBULATORY_CARE_PROVIDER_SITE_OTHER): Payer: Self-pay | Admitting: Primary Care

## 2019-10-12 MED ORDER — METRONIDAZOLE 500 MG PO TABS: 500.0000 mg | ORAL_TABLET | Freq: Two times a day (BID) | ORAL | 0 refills | Status: DC

## 2019-10-12 MED ORDER — FLUCONAZOLE 150 MG PO TABS: 150.0000 mg | ORAL_TABLET | Freq: Once | ORAL | 1 refills | Status: AC

## 2019-10-14 ENCOUNTER — Telehealth (INDEPENDENT_AMBULATORY_CARE_PROVIDER_SITE_OTHER): Payer: Self-pay

## 2019-10-14 NOTE — Telephone Encounter (Signed)
-----   Message from Kerin Perna, NP sent at 10/12/2019 10:15 AM EST ----- A prescription for metronidazole. You take this medication twice a day for 7 days. Be sure that you do not drink alcohol when you take this medication because the combination can give you severe nausea and vomiting.  It can sometimes give people a metallic taste in their mouth while they are taking it. When you take antibiotics, it can wipe out your gut flora.  This can cause problems like diarrhea.  It would be helpful to restore your gut flora and potentially decrease the side effects if you were to take either an over-the-counter probiotic such as [employer], Radiographer, therapeutic or [employer].  I would recommend taking this on a daily basis for at least 3-4 weeks. Candida - yeast infection sent in diflucan

## 2019-10-14 NOTE — Telephone Encounter (Signed)
Call placed using pacific interpreter [phone]) left voicemail informing patient that she has BV. A prescription for metronidazole sent to pharmacy. Take to completion. Diflucan also sent. If you begin to develop symptoms of yeast infection after completing metronidazole take the Diflucan. All other labs will be discussed at [place] on 10/17/2019. Please return call to [facility] at [phone] should you have any questions or concerns. Nat Christen, CMA

## 2019-10-17 ENCOUNTER — Ambulatory Visit (INDEPENDENT_AMBULATORY_CARE_PROVIDER_SITE_OTHER): Payer: Self-pay | Admitting: Primary Care

## 2019-10-17 ENCOUNTER — Encounter (INDEPENDENT_AMBULATORY_CARE_PROVIDER_SITE_OTHER): Payer: Self-pay | Admitting: Primary Care

## 2019-10-17 ENCOUNTER — Other Ambulatory Visit: Payer: Self-pay

## 2019-10-17 VITALS — BP 145/82 | HR 79 | Temp 97.3°F | Ht 59.0 in | Wt 172.6 lb

## 2019-10-17 DIAGNOSIS — I10 Essential (primary) hypertension: Secondary | ICD-10-CM

## 2019-10-17 DIAGNOSIS — R739 Hyperglycemia, unspecified: Secondary | ICD-10-CM

## 2019-10-17 DIAGNOSIS — E119 Type 2 diabetes mellitus without complications: Secondary | ICD-10-CM

## 2019-10-17 LAB — GLUCOSE, POCT (MANUAL RESULT ENTRY): POC Glucose: 185 mg/dl — AB (ref 70–99)

## 2019-10-17 NOTE — Patient Instructions (Addendum)
cb Diabetes mellitus y [place] fsica Diabetes Mellitus and Exercise Hacer actividad fsica habitualmente es importante para el estado de salud general, en especial si tiene diabetes (diabetes mellitus). La actividad fsica no solo se reduce a Pharmacist, hospital. Aporta muchos beneficios para la salud, como aumento de la fuerza muscular y la densidad sea, y reduccin de las grasas corporales y Dealer. Esto mejora el estado fsico, la flexibilidad y la resistencia, y todo ello redunda en un mejor estado de salud general. La actividad fsica tiene beneficios adicionales para los diabticos, entre ellos:  Disminuye el apetito.  Ayuda a bajar y Consulting civil engineer glucemia bajo control.  Baja la presin arterial.  Ayuda a controlar las cantidades de sustancias grasas (lpidos) en la [place], como el colesterol y los triglicridos.  Mejora la respuesta del cuerpo a la insulina (optimizacin de la sensibilidad a la insulina).  Reduce la cantidad de insulina que el cuerpo necesita.  Reduce el riesgo de sufrir cardiopata coronaria de la siguiente forma: ? [employer] de colesterol y triglicridos. ? Aumenta los niveles de colesterol bueno. ? Disminuye la glucemia. Cul es mi plan de Kandace Blitz? El mdico o un educador para la diabetes certificado pueden ayudarlo a Engineer, petroleum del tipo y de la frecuencia de actividad fsica (plan de actividades) adecuado para usted. Asegrese de lo siguiente:  Haga por lo menos 190minutos semanales de ejercicios de intensidad moderada o vigorosa. Estos podran ser caminatas dinmicas, ciclismo o [place]. ? Haga ejercicios de elongacin y de fortalecimiento, como yoga o levantamiento de pesas, por lo menos 2veces por semana. ? Reparta la actividad en al menos 3das de la semana.  Haga algn tipo de actividad fsica [employer]. ? No deje pasar ms de 2das seguidos sin hacer algn tipo de actividad fsica. ? Evite permanecer inactivo  durante ms de 28minutos seguidos. Tmese descansos frecuentes para caminar o estirarse.  Elija un tipo de ejercicio o de actividad que disfrute y establezca objetivos realistas.  Comience lentamente y aumente de [place] gradual la intensidad del ejercicio con el correr del [place]. Qu debo saber acerca del control de la diabetes?   Contrlese la glucemia antes y despus de ejercitarse. ? Si la glucemia es de 240mg /dl (13,35mmol/l) o ms antes de comenzar a hacer actividad fsica, controle la orina para detectar la presencia de cetonas. Si tiene [employer] orina, no haga ejercicio hasta que la glucemia se normalice. ? Si la glucemia es de 100 mg/dl (5.6 mmol/l) o menos, tome una colacin que [employer] 15 y 20 gramos de carbohidratos. Controle la glucemia 15 minutos despus de la colacin para asegurarse de que el nivel est por encima de 100 mg/dl (5.6 mmol/l) antes de comenzar a hacer actividad fsica.  Conozca los sntomas de la glucemia baja (hipoglucemia) y aprenda cmo tratarla. El riesgo de tener hipoglucemia [place] durante y despus de hacer actividad fsica. Los sntomas frecuentes de hipoglucemia pueden incluir los siguientes: ? Hambre. ? Ansiedad. ? Sudoracin y [employer]. ? Confusin. ? Mareos o sensacin de desvanecimiento. ? Aumento de la frecuencia cardaca o palpitaciones. ? Visin borrosa. ? Hormigueo o adormecimiento alrededor [employer]xon [employer]il Corporation, los labios o la [place]. ? Estremecimientos y temblores. ? Irritabilidad.  Tenga una colacin de carbohidratos de accin rpida disponible antes, durante y despus de ejercitarse, a fin de evitar o tratar la hipoglucemia.  Evite inyectarse insulina en las zonas del cuerpo que ejercitar. Por ejemplo, evite inyectarse insulina en: ? Los  brazos, si juega al tenis. ? Las piernas, si corre.  Lleve registros de sus hbitos de actividad fsica. Esto puede ayudarlos a usted y al mdico a Retail banker de control de la diabetes  segn sea necesario. Escriba los siguientes datos: ? Los alimentos que consume antes y despus de Radio producer actividad fsica. ? Los niveles de glucosa en la sangre antes y despus de hacer ejericios. ? El tipo y cantidad de [place] fsica que Biomedical engineer. ? Cuando se prev que la insulina alcance su valor mximo, si [place] insulina. No haga actividad fsica en los momentos en que insulina alcanza su valor mximo.  Cuando comience un ejercicio o una actividad nuevos, trabaje con el mdico para asegurarse de que la actividad sea segura para usted y para Academic librarian la [place], los medicamentos o la ingesta de alimentos segn sea necesario.  Beba gran cantidad de agua mientras hace ejercicio para evitar la deshidratacin o los golpes de Airline pilot. Beba suficiente lquido como para mantener la orina clara o de color amarillo plido. Resumen  Hacer actividad fsica habitualmente es importante para el estado de salud general, en especial si tiene diabetes (diabetes mellitus).  La actividad fsica aporta muchos beneficios para la salud, como aumentar la fuerza muscular y la densidad sea, y reducir las grasas corporales y Development worker, community.  El mdico o un educador para la diabetes certificado pueden ayudarlo a Teacher, English as a foreign language del tipo y de la frecuencia de actividad fsica (plan de actividades) adecuado para usted.  Cuando comience un ejercicio o una actividad nuevos, trabaje con el mdico para asegurarse de que la actividad sea segura para usted y para Academic librarian la [place], los medicamentos o la ingesta de alimentos segn sea necesario. Esta informacin no tiene Theme park manager el consejo del mdico. Asegrese de hacerle al mdico cualquier pregunta que tenga. Document Released: 12/14/2007 Document Revised: 09/21/2017 Document Reviewed: 05/05/2016 Elsevier Patient Education  2020 [employer].

## 2019-10-17 NOTE — Progress Notes (Signed)
Acute Office Visit  Subjective:    Patient ID: Susan [name], female    DOB: 1976/08/29, 43 y.o.   MRN: [id number]  No chief complaint on file.   HPI Patient is in today for follow up on diabetes. Previous visit A1C was 10.8 and will recheck in 2 months. She denies any concerns or problems.  Past Medical History:  Diagnosis Date  . Hypertension     No past surgical history on file.  Family History  Problem Relation Age of Onset  . Diabetes Mother   . Diabetes Father   . Hypertension Father     Social History   Socioeconomic History  . Marital status: Single    Spouse name: Not on file  . Number of children: Not on file  . Years of education: Not on file  . Highest education level: Not on file  Occupational History  . Not on file  Social Needs  . Financial resource strain: Not on file  . Food insecurity    Worry: Not on file    Inability: Not on file  . Transportation needs    Medical: Not on file    Non-medical: Not on file  Tobacco Use  . Smoking status: Never Smoker  . Smokeless tobacco: Never Used  Substance and Sexual Activity  . Alcohol use: No  . Drug use: No  . Sexual activity: Yes    Birth control/protection: Condom  Lifestyle  . Physical activity    Days per week: Not on file    Minutes per session: Not on file  . Stress: Not on file  Relationships  . Social Musician on phone: Not on file    Gets together: Not on file    Attends religious service: Not on file    Active member of club or organization: Not on file    Attends meetings of clubs or organizations: Not on file    Relationship status: Not on file  . Intimate partner violence    Fear of current or ex partner: Not on file    Emotionally abused: Not on file    Physically abused: Not on file    Forced sexual activity: Not on file  Other Topics Concern  . Not on file  Social History Narrative  . Not on file    Outpatient Medications Prior to Visit  Medication  Sig Dispense Refill  . atorvastatin (LIPITOR) 20 MG tablet Take 1 tablet (20 mg total) by mouth daily. 90 tablet 3  . glipiZIDE (GLUCOTROL) 10 MG tablet Take 1 tablet (10 mg total) by mouth 2 (two) times daily before a meal. 60 tablet 3  . hydrochlorothiazide (HYDRODIURIL) 25 MG tablet Take 1 tablet (25 mg total) by mouth daily. 30 tablet 3  . lisinopril (ZESTRIL) 10 MG tablet Take 1 tablet (10 mg total) by mouth daily. 30 tablet 3  . metFORMIN (GLUCOPHAGE) 1000 MG tablet Take 1 tablet (1,000 mg total) by mouth 2 (two) times daily with a meal. 60 tablet 3  . metroNIDAZOLE (FLAGYL) 500 MG tablet Take 1 tablet (500 mg total) by mouth 2 (two) times daily. 14 tablet 0   No facility-administered medications prior to visit.     No Known Allergies  Review of Systems  Endo/Heme/Allergies: Positive for polydipsia.  All other systems reviewed and are negative.      Objective:    Physical Exam  Constitutional: She is oriented to person, place, and time. She appears well-developed and  well-nourished.  Neck: Neck supple.  Cardiovascular: Normal rate and regular rhythm.  Pulmonary/Chest: Effort normal and breath sounds normal.  Abdominal: Soft. Bowel sounds are normal. She exhibits distension.  Neurological: She is oriented to person, place, and time.  Psychiatric: She has a normal mood and affect. Her behavior is normal. Thought content normal.    BP (!) 145/82 (BP Location: Right Arm, Patient Position: Sitting, Cuff Size: Normal)   Pulse 79   Temp (!) 97.3 F (36.3 C) (Temporal)   Ht 4\' 11"  (1.499 m)   Wt 172 lb 9.6 oz (78.3 kg)   LMP 10/15/2019 (Exact Date)   SpO2 100%   BMI 34.86 kg/m  Wt Readings from Last 3 Encounters:  10/17/19 172 lb 9.6 oz (78.3 kg)  10/04/19 176 lb 6.4 oz (80 kg)  10/08/17 177 lb 6.4 oz (80.5 kg)    There are no preventive care reminders to display for this patient.  There are no preventive care reminders to display for this patient.   No results found  for: TSH Lab Results  Component Value Date   WBC 11.8 (H) 10/04/2019   HGB 13.0 10/04/2019   HCT 39.7 10/04/2019   MCV 84 10/04/2019   PLT 217 10/04/2019   Lab Results  Component Value Date   NA 134 10/04/2019   K 4.3 10/04/2019   CO2 23 10/04/2019   GLUCOSE 345 (H) 10/04/2019   BUN 7 10/04/2019   CREATININE 0.64 10/04/2019   BILITOT <0.2 10/04/2019   ALKPHOS 126 (H) 10/04/2019   AST 165 (H) 10/04/2019   ALT 160 (H) 10/04/2019   PROT 7.8 10/04/2019   ALBUMIN 4.4 10/04/2019   CALCIUM 9.2 10/04/2019   ANIONGAP 7 11/07/2015   Lab Results  Component Value Date   CHOL 206 (H) 10/04/2019   Lab Results  Component Value Date   HDL 32 (L) 10/04/2019   Lab Results  Component Value Date   LDLCALC 135 (H) 10/04/2019   Lab Results  Component Value Date   TRIG 215 (H) 10/04/2019   Lab Results  Component Value Date   CHOLHDL 6.4 (H) 10/04/2019   Lab Results  Component Value Date   HGBA1C 10.8 (A) 10/04/2019   Diagnoses and all orders for this visit:  Type 2 diabetes mellitus without complication, without long-term current use of insulin (HCC) Uncontrolled last A1C 10.8 and medication adjusted metformin 1000mg  twice daily and glucotrol 10mg  twice daily, diet modifcation and excercing. On follow up if A1C has not had significant improvement we will start insulin. Patient is not at all please with this option.  Hyperglycemia -     Glucose (CBG)    Essential hypertension Well controlled on Lisinopril 10mg  and Hctz 25mg  daily.    No orders of the defined types were placed in this encounter.    Kerin Perna, NP

## 2019-11-10 ENCOUNTER — Encounter (HOSPITAL_COMMUNITY): Payer: Self-pay

## 2019-11-10 ENCOUNTER — Other Ambulatory Visit: Payer: Self-pay

## 2019-11-10 ENCOUNTER — Ambulatory Visit (HOSPITAL_COMMUNITY)
Admission: RE | Admit: 2019-11-10 | Discharge: 2019-11-10 | Disposition: A | Discharge status: Home / Self Care | Payer: Self-pay | Source: Ambulatory Visit | Attending: Obstetrics and Gynecology | Admitting: Obstetrics and Gynecology

## 2019-11-10 DIAGNOSIS — Z1239 Encounter for other screening for malignant neoplasm of breast: Secondary | ICD-10-CM | POA: Insufficient documentation

## 2019-11-10 HISTORY — DX: Other specified diabetes mellitus without complications: E13.9

## 2019-11-10 NOTE — Patient Instructions (Signed)
Explained breast self awareness with Paulette Blanch. Patient did not need a Pap smear today due to last Pap smear and HPV typing was 10/08/2017. Let her know B[facility]CP will cover Pap smears and HPV typing every 5 years unless has a history of abnormal Pap smears. Referred patient to the [facility] for a screening mammogram. Appointment scheduled for Tuesday, November 15, 2019 at 0900. Patient aware of appointment and will be there. Let [facility] know [facility] will follow up with her within the next couple weeks with results of mammogram by letter or phone. Paulette Blanch verbalized understanding.  Brannock, Arvil Chaco, RN 3:34 PM

## 2019-11-10 NOTE — Progress Notes (Signed)
No complaints today.   Pap Smear: Pap smear not completed today. Last Pap smear was 10/08/2017 at [facility] and normal with negative HPV. Per patient has no history of an abnormal Pap smear. Last Pap smear result is in Epic.  Physical exam: Breasts Breasts symmetrical. No skin abnormalities bilateral breasts. No nipple retraction bilateral breasts. No nipple discharge bilateral breasts. No lymphadenopathy. No lumps palpated bilateral breasts. No complaints of pain or tenderness on exam. Referred patient to the [facility] for a screening mammogram. Appointment scheduled for Tuesday, November 15, 2019 at 0900.        Pelvic/Bimanual No Pap smear completed today since last Pap smear and HPV typing was 10/08/2017. Pap smear not indicated per BCCCP guidelines.   Smoking History: Patient has never smoked.  Patient Navigation: Patient education provided. Access to services provided for patient through [facility] program. Spanish interpreter provided.   Breast and Cervical Cancer Risk Assessment: Patient has no family history of breast cancer, known genetic mutations, or radiation treatment to the chest before age 87. Patient has no history of cervical dysplasia, immunocompromised, or DES exposure in-utero.  Risk Assessment    Risk Scores      11/10/2019   Last edited by: Loletta Parish, RN   5-year risk: 0.4 %   Lifetime risk: 5 %         Used Spanish interpreter Rudene Anda from [place].

## 2019-11-15 ENCOUNTER — Encounter (HOSPITAL_COMMUNITY): Payer: Self-pay

## 2019-11-15 ENCOUNTER — Ambulatory Visit
Admission: RE | Admit: 2019-11-15 | Discharge: 2019-11-15 | Disposition: A | Discharge status: Home / Self Care | Payer: No Typology Code available for payment source | Source: Ambulatory Visit | Attending: Obstetrics and Gynecology | Admitting: Obstetrics and Gynecology

## 2019-11-15 ENCOUNTER — Other Ambulatory Visit: Payer: Self-pay

## 2019-11-15 DIAGNOSIS — Z1231 Encounter for screening mammogram for malignant neoplasm of breast: Secondary | ICD-10-CM

## 2019-12-06 ENCOUNTER — Ambulatory Visit (INDEPENDENT_AMBULATORY_CARE_PROVIDER_SITE_OTHER): Payer: Self-pay | Admitting: Primary Care

## 2019-12-08 ENCOUNTER — Telehealth (INDEPENDENT_AMBULATORY_CARE_PROVIDER_SITE_OTHER): Payer: Self-pay | Admitting: Primary Care

## 2019-12-08 NOTE — Telephone Encounter (Signed)
1) Medication(s) Requested (by name): -lisinopril (ZESTRIL) 10 MG tablet   2) Pharmacy of Choice: -[facility] ([place]), [place] - [address]

## 2019-12-12 ENCOUNTER — Other Ambulatory Visit (INDEPENDENT_AMBULATORY_CARE_PROVIDER_SITE_OTHER): Payer: Self-pay | Admitting: Primary Care

## 2019-12-12 MED ORDER — LISINOPRIL 10 MG PO TABS: 10.0000 mg | ORAL_TABLET | Freq: Every day | ORAL | 1 refills | Status: DC

## 2019-12-15 ENCOUNTER — Ambulatory Visit: Payer: Self-pay | Attending: Family Medicine

## 2019-12-15 ENCOUNTER — Other Ambulatory Visit: Payer: Self-pay

## 2019-12-21 ENCOUNTER — Encounter (INDEPENDENT_AMBULATORY_CARE_PROVIDER_SITE_OTHER): Payer: Self-pay | Admitting: Primary Care

## 2019-12-21 ENCOUNTER — Other Ambulatory Visit: Payer: Self-pay

## 2019-12-21 ENCOUNTER — Ambulatory Visit (INDEPENDENT_AMBULATORY_CARE_PROVIDER_SITE_OTHER): Payer: Self-pay | Admitting: Primary Care

## 2019-12-21 VITALS — BP 147/79 | HR 85 | Temp 97.3°F | Ht 59.0 in | Wt 169.8 lb

## 2019-12-21 DIAGNOSIS — I10 Essential (primary) hypertension: Secondary | ICD-10-CM

## 2019-12-21 DIAGNOSIS — E119 Type 2 diabetes mellitus without complications: Secondary | ICD-10-CM

## 2019-12-21 DIAGNOSIS — E782 Mixed hyperlipidemia: Secondary | ICD-10-CM

## 2019-12-21 LAB — GLUCOSE, POCT (MANUAL RESULT ENTRY): POC Glucose: 199 mg/dl — AB (ref 70–99)

## 2019-12-21 LAB — POCT GLYCOSYLATED HEMOGLOBIN (HGB A1C): Hemoglobin A1C: 5.9 % — AB (ref 4.0–5.6)

## 2019-12-21 MED ORDER — HYDROCHLOROTHIAZIDE 25 MG PO TABS: 25.0000 mg | ORAL_TABLET | Freq: Every day | ORAL | 1 refills | Status: DC

## 2019-12-21 MED ORDER — GLIPIZIDE 10 MG PO TABS: 10.0000 mg | ORAL_TABLET | Freq: Two times a day (BID) | ORAL | 1 refills | Status: DC

## 2019-12-21 MED ORDER — METFORMIN HCL 500 MG PO TABS: 1000.0000 mg | ORAL_TABLET | Freq: Two times a day (BID) | ORAL | 1 refills | Status: DC

## 2019-12-21 MED ORDER — ATORVASTATIN CALCIUM 20 MG PO TABS: 20.0000 mg | ORAL_TABLET | Freq: Every day | ORAL | 3 refills | Status: DC

## 2019-12-21 MED ORDER — LISINOPRIL 20 MG PO TABS: 20.0000 mg | ORAL_TABLET | Freq: Every day | ORAL | 1 refills | Status: DC

## 2019-12-21 NOTE — Patient Instructions (Signed)
Diabetes mellitus y nutricin, en adultos Diabetes Mellitus and Nutrition, Adult Si sufre de diabetes (diabetes mellitus), es muy importante tener hbitos alimenticios saludables debido a que sus niveles de azcar en la sangre (glucosa) se ven afectados en gran medida por lo que come y bebe. Comer alimentos saludables en las cantidades adecuadas, aproximadamente a la misma hora todos los das, lo ayudar a:  Controlar la glucemia.  Disminuir el riesgo de sufrir una enfermedad cardaca.  Mejorar la presin arterial.  Alcanzar o mantener un peso saludable. Todas las personas que sufren de diabetes son diferentes y cada una tiene necesidades diferentes en cuanto a un plan de alimentacin. El mdico puede recomendarle que trabaje con un especialista en dietas y nutricin (nutricionista) para elaborar el mejor plan para usted. Su plan de alimentacin puede variar segn factores como:  Las caloras que necesita.  Los medicamentos que toma.  Su peso.  Sus niveles de glucemia, presin arterial y colesterol.  Su nivel de actividad.  Otras afecciones que tenga, como enfermedades cardacas o renales. Cmo me afectan los carbohidratos? Los carbohidratos, o hidratos de carbono, afectan su nivel de glucemia ms que cualquier otro tipo de alimento. La ingesta de carbohidratos naturalmente aumenta la cantidad de glucosa en la sangre. El recuento de carbohidratos es un mtodo destinado a llevar un registro de la cantidad de carbohidratos que se consumen. El recuento de carbohidratos es importante para mantener la glucemia a un nivel saludable, especialmente si utiliza insulina o toma determinados medicamentos por va oral para la diabetes. Es importante conocer la cantidad de carbohidratos que se pueden ingerir en cada comida sin correr ningn riesgo. Esto es diferente en cada persona. Su nutricionista puede ayudarlo a calcular la cantidad de carbohidratos que debe ingerir en cada comida y en cada  refrigerio. Entre los alimentos que contienen carbohidratos, se incluyen:  Pan, cereal, arroz, pastas y galletas.  Papas y maz.  Guisantes, frijoles y lentejas.  Leche y yogur.  Frutas y jugo.  Postres, como pasteles, galletas, helado y caramelos. Cmo me afecta el alcohol? El alcohol puede provocar disminuciones sbitas de la glucemia (hipoglucemia), especialmente si utiliza insulina o toma determinados medicamentos por va oral para la diabetes. La hipoglucemia es una afeccin potencialmente mortal. Los sntomas de la hipoglucemia (somnolencia, mareos y confusin) son similares a los sntomas de haber consumido demasiado alcohol. Si el mdico afirma que el alcohol es seguro para usted, siga estas pautas:  Limite el consumo de alcohol a no ms de 1medida por da si es mujer y no est embarazada, y a 2medidas si es hombre. Una medida equivale a 12oz (355ml) de cerveza, 5oz (148ml) de vino o 1oz (44ml) de bebidas alcohlicas de alta graduacin.  No beba con el estmago vaco.  Mantngase hidratado bebiendo agua, refrescos dietticos o t helado sin azcar.  Tenga en cuenta que los refrescos comunes, los jugos y otras bebida para mezclar pueden contener mucha azcar y se deben contar como carbohidratos. Cules son algunos consejos para seguir este plan?  Leer las etiquetas de los alimentos  Comience por leer el tamao de la porcin en la "Informacin nutricional" en las etiquetas de los alimentos envasados y las bebidas. La cantidad de caloras, carbohidratos, grasas y otros nutrientes mencionados en la etiqueta se basan en una porcin del alimento. Muchos alimentos contienen ms de una porcin por envase.  Verifique la cantidad total de gramos (g) de carbohidratos totales en una porcin. Puede calcular la cantidad de porciones de carbohidratos al dividir el   total de carbohidratos por 15. Por ejemplo, si un alimento tiene un total de 30g de carbohidratos, equivale a 2  porciones de carbohidratos.  Verifique la cantidad de gramos (g) de grasas saturadas y grasas trans en una porcin. Escoja alimentos que no contengan grasa o que tengan un bajo contenido.  Verifique la cantidad de miligramos (mg) de sal (sodio) en una porcin. La mayora de las personas deben limitar la ingesta de sodio total a menos de 2300mg por da.  Siempre consulte la informacin nutricional de los alimentos etiquetados como "con bajo contenido de grasa" o "sin grasa". Estos alimentos pueden tener un mayor contenido de azcar agregada o carbohidratos refinados, y deben evitarse.  Hable con su nutricionista para identificar sus objetivos diarios en cuanto a los nutrientes mencionados en la etiqueta. Al ir de compras  Evite comprar alimentos procesados, enlatados o precocinados. Estos alimentos tienden a tener una mayor cantidad de grasa, sodio y azcar agregada.  Compre en la zona exterior de la tienda de comestibles. Esta zona incluye frutas y verduras frescas, granos a granel, carnes frescas y productos lcteos frescos. Al cocinar  Utilice mtodos de coccin a baja temperatura, como hornear, en lugar de mtodos de coccin a alta temperatura, como frer en abundante aceite.  Cocine con aceites saludables, como el aceite de oliva, canola o girasol.  Evite cocinar con manteca, crema o carnes con alto contenido de grasa. Planificacin de las comidas  Coma las comidas y los refrigerios regularmente, preferentemente a la misma hora todos los das. Evite pasar largos perodos de tiempo sin comer.  Consuma alimentos ricos en fibra, como frutas frescas, verduras, frijoles y cereales integrales. Consulte a su nutricionista sobre cuntas porciones de carbohidratos puede consumir en cada comida.  Consuma entre 4 y 6 onzas (oz) de protenas magras por da, como carnes magras, pollo, pescado, huevos o tofu. Una onza de protena magra equivale a: ? 1 onza de carne, pollo o  pescado. ? 1huevo. ?  taza de tofu.  Coma algunos alimentos por da que contengan grasas saludables, como aguacates, frutos secos, semillas y pescado. Estilo de vida  Controle su nivel de glucemia con regularidad.  Haga actividad fsica habitualmente como se lo haya indicado el mdico. Esto puede incluir lo siguiente: ? 150minutos semanales de ejercicio de intensidad moderada o alta. Esto podra incluir caminatas dinmicas, ciclismo o gimnasia acutica. ? Realizar ejercicios de elongacin y de fortalecimiento, como yoga o levantamiento de pesas, por lo menos 2veces por semana.  Tome los medicamentos como se lo haya indicado el mdico.  No consuma ningn producto que contenga nicotina o tabaco, como cigarrillos y cigarrillos electrnicos. Si necesita ayuda para dejar de fumar, consulte al mdico.  Trabaje con un asesor o instructor en diabetes para identificar estrategias para controlar el estrs y cualquier desafo emocional y social. Preguntas para hacerle al mdico  Es necesario que consulte a un instructor en el cuidado de la diabetes?  Es necesario que me rena con un nutricionista?  A qu nmero puedo llamar si tengo preguntas?  Cules son los mejores momentos para controlar la glucemia? Dnde encontrar ms informacin:  Asociacin Estadounidense de la Diabetes (American Diabetes Association): diabetes.org  Academia de Nutricin y Diettica (Academy of Nutrition and Dietetics): www.eatright.org  Instituto Nacional de la Diabetes y las Enfermedades Digestivas y Renales (National Institute of Diabetes and Digestive and Kidney Diseases, NIH): www.niddk.nih.gov Resumen  Un plan de alimentacin saludable lo ayudar a controlar la glucemia y mantener un estilo de vida saludable.    Trabajar con un especialista en dietas y nutricin (nutricionista) puede ayudarlo a elaborar el mejor plan de alimentacin para usted.  Tenga en cuenta que los carbohidratos (hidratos de  carbono) y el alcohol tienen efectos inmediatos en sus niveles de glucemia. Es importante contar los carbohidratos que ingiere y consumir alcohol con prudencia. Esta informacin no tiene como fin reemplazar el consejo del mdico. Asegrese de hacerle al mdico cualquier pregunta que tenga. Document Revised: 08/04/2017 Document Reviewed: 03/16/2017 Elsevier Patient Education  2020 Elsevier Inc.  

## 2019-12-21 NOTE — Progress Notes (Signed)
Established Patient Office Visit  Subjective:  Patient ID: Susan [name], female    DOB: 12-Oct-1976  Age: 44 y.o. MRN: [id number]  CC:  Chief Complaint  Patient presents with  . Follow-up    DM    HPI Susan [name] presents for the management of diabetes. She endorses polyuria. Excited and very please A1C today 5.9 2 months ago A1C 10.8. Blood pressures is elevated taking medication as prescribes (this morning at 8:30) Denies shortness of breath, headaches, chest pain or lower extremity edema  Past Medical History:  Diagnosis Date  . Diabetes 1.5, managed as type 2 (HCC)   . Hypertension     History reviewed. No pertinent surgical history.  Family History  Problem Relation Age of Onset  . Diabetes Mother   . Diabetes Father   . Hypertension Father     Social History   Socioeconomic History  . Marital status: Single    Spouse name: Not on file  . Number of children: Not on file  . Years of education: Not on file  . Highest education level: 5th grade  Occupational History  . Not on file  Tobacco Use  . Smoking status: Never Smoker  . Smokeless tobacco: Never Used  Substance and Sexual Activity  . Alcohol use: No  . Drug use: No  . Sexual activity: Yes    Birth control/protection: Condom  Other Topics Concern  . Not on file  Social History Narrative  . Not on file   Social Determinants of Health   Financial Resource Strain:   . Difficulty of Paying Living Expenses: Not on file  Food Insecurity:   . Worried About Programme researcher, broadcasting/film/video in the Last Year: Not on file  . Ran Out of Food in the Last Year: Not on file  Transportation Needs: No Transportation Needs  . Lack of Transportation (Medical): No  . Lack of Transportation (Non-Medical): No  Physical Activity:   . Days of Exercise per Week: Not on file  . Minutes of Exercise per Session: Not on file  Stress:   . Feeling of Stress : Not on file  Social Connections:   . Frequency of  Communication with Friends and Family: Not on file  . Frequency of Social Gatherings with Friends and Family: Not on file  . Attends Religious Services: Not on file  . Active Member of Clubs or Organizations: Not on file  . Attends Banker Meetings: Not on file  . Marital Status: Not on file  Intimate Partner Violence:   . Fear of Current or Ex-Partner: Not on file  . Emotionally Abused: Not on file  . Physically Abused: Not on file  . Sexually Abused: Not on file    Outpatient Medications Prior to Visit  Medication Sig Dispense Refill  . atorvastatin (LIPITOR) 20 MG tablet Take 1 tablet (20 mg total) by mouth daily. 90 tablet 3  . glipiZIDE (GLUCOTROL) 10 MG tablet Take 1 tablet (10 mg total) by mouth 2 (two) times daily before a meal. 60 tablet 3  . hydrochlorothiazide (HYDRODIURIL) 25 MG tablet Take 1 tablet (25 mg total) by mouth daily. 30 tablet 3  . lisinopril (ZESTRIL) 10 MG tablet Take 1 tablet (10 mg total) by mouth daily. 90 tablet 1  . metFORMIN (GLUCOPHAGE) 1000 MG tablet Take 1 tablet (1,000 mg total) by mouth 2 (two) times daily with a meal. 60 tablet 3  . metroNIDAZOLE (FLAGYL) 500 MG tablet Take 1 tablet (  500 mg total) by mouth 2 (two) times daily. 14 tablet 0   No facility-administered medications prior to visit.    No Known Allergies  ROS Review of Systems  Endocrine: Positive for polydipsia.  All other systems reviewed and are negative.     Objective:    Physical Exam  Constitutional: She is oriented to person, place, and time. She appears well-developed and well-nourished.  obese  HENT:  Head: Normocephalic.  Cardiovascular: Normal rate and regular rhythm.  Pulmonary/Chest: Effort normal and breath sounds normal.  Abdominal: Bowel sounds are normal.  Musculoskeletal:     Cervical back: Normal range of motion and neck supple.  Neurological: She is oriented to person, place, and time.  Skin: Skin is warm and dry.  Psychiatric: She has a  normal mood and affect. Her behavior is normal. Judgment and thought content normal.    BP (!) 147/79 (BP Location: Right Arm, Patient Position: Sitting, Cuff Size: Normal)   Pulse 85   Temp (!) 97.3 F (36.3 C) (Temporal)   Ht 4\' 11"  (1.499 m)   Wt 169 lb 12.8 oz (77 kg)   LMP 12/11/2019 (Approximate)   SpO2 100%   BMI 34.30 kg/m  Wt Readings from Last 3 Encounters:  12/21/19 169 lb 12.8 oz (77 kg)  11/10/19 171 lb (77.6 kg)  10/17/19 172 lb 9.6 oz (78.3 kg)     There are no preventive care reminders to display for this patient.  There are no preventive care reminders to display for this patient.  No results found for: TSH Lab Results  Component Value Date   WBC 11.8 (H) 10/04/2019   HGB 13.0 10/04/2019   HCT 39.7 10/04/2019   MCV 84 10/04/2019   PLT 217 10/04/2019   Lab Results  Component Value Date   NA 134 10/04/2019   K 4.3 10/04/2019   CO2 23 10/04/2019   GLUCOSE 345 (H) 10/04/2019   BUN 7 10/04/2019   CREATININE 0.64 10/04/2019   BILITOT <0.2 10/04/2019   ALKPHOS 126 (H) 10/04/2019   AST 165 (H) 10/04/2019   ALT 160 (H) 10/04/2019   PROT 7.8 10/04/2019   ALBUMIN 4.4 10/04/2019   CALCIUM 9.2 10/04/2019   ANIONGAP 7 11/07/2015   Lab Results  Component Value Date   CHOL 206 (H) 10/04/2019   Lab Results  Component Value Date   HDL 32 (L) 10/04/2019   Lab Results  Component Value Date   LDLCALC 135 (H) 10/04/2019   Lab Results  Component Value Date   TRIG 215 (H) 10/04/2019   Lab Results  Component Value Date   CHOLHDL 6.4 (H) 10/04/2019   Lab Results  Component Value Date   HGBA1C 5.9 (A) 12/21/2019      Assessment & Plan:  Susan [name] was seen today for follow-up.  Diagnoses and all orders for this visit:  Type 2 diabetes mellitus without complication, without long-term current use of insulin (Susan [name]) Controlled at therapeutic goal for A1C. Metformin decreased from 1000 mg BID to 500 mg BID. Continue to monitor carbohydrate intake,  exercing. -     HgB A1c 5.9  -     Glucose (CBG) -     glipiZIDE (GLUCOTROL) 10 MG tablet; Take 1 tablet (10 mg total) by mouth 2 (two) times daily before a meal.  Essential hypertension Blood pressure goal of less than 130/80. Increased lisinopril from 10mg  to 20mg  and follow up on Bp in person in 2 months for effectiveness. Continue   low-sodium,  DASH diet, medication compliance, 150 minutes of moderate intensity exercise per week. Discussed medication compliance, adverse effects.  Mixed hyperlipidemia Last lipids revealed elevated  cholesterol that can lead to heart attack and stroke. Try to decrease your fatty foods, red meat, cheese, milk and increase fiber like whole grains and veggies. Continue to take atorvastatin 20mg  Qhs   Other orders -     lisinopril (ZESTRIL) 20 MG tablet; Take 1 tablet (20 mg total) by mouth daily. -     atorvastatin (LIPITOR) 20 MG tablet; Take 1 tablet (20 mg total) by mouth daily. -     hydrochlorothiazide (HYDRODIURIL) 25 MG tablet; Take 1 tablet (25 mg total) by mouth daily. -     metFORMIN (GLUCOPHAGE) 500 MG tablet; Take 2 tablets (1,000 mg total) by mouth 2 (two) times daily with a meal.    Meds ordered this encounter  Medications  . lisinopril (ZESTRIL) 20 MG tablet    Sig: Take 1 tablet (20 mg total) by mouth daily.    Dispense:  90 tablet    Refill:  1  . atorvastatin (LIPITOR) 20 MG tablet    Sig: Take 1 tablet (20 mg total) by mouth daily.    Dispense:  90 tablet    Refill:  3  . hydrochlorothiazide (HYDRODIURIL) 25 MG tablet    Sig: Take 1 tablet (25 mg total) by mouth daily.    Dispense:  90 tablet    Refill:  1  . metFORMIN (GLUCOPHAGE) 500 MG tablet    Sig: Take 2 tablets (1,000 mg total) by mouth 2 (two) times daily with a meal.    Dispense:  90 tablet    Refill:  1  . glipiZIDE (GLUCOTROL) 10 MG tablet    Sig: Take 1 tablet (10 mg total) by mouth 2 (two) times daily before a meal.    Dispense:  180 tablet    Refill:  1     Follow-up: Return in about 2 months (around 02/18/2020) for in person Bp check.    02/20/2020, NP

## 2020-01-09 ENCOUNTER — Ambulatory Visit (INDEPENDENT_AMBULATORY_CARE_PROVIDER_SITE_OTHER): Payer: Self-pay | Admitting: Primary Care

## 2020-01-09 ENCOUNTER — Other Ambulatory Visit: Payer: Self-pay

## 2020-01-09 ENCOUNTER — Encounter (INDEPENDENT_AMBULATORY_CARE_PROVIDER_SITE_OTHER): Payer: Self-pay | Admitting: Primary Care

## 2020-01-09 DIAGNOSIS — M25561 Pain in right knee: Secondary | ICD-10-CM

## 2020-01-09 DIAGNOSIS — I10 Essential (primary) hypertension: Secondary | ICD-10-CM

## 2020-01-09 DIAGNOSIS — E119 Type 2 diabetes mellitus without complications: Secondary | ICD-10-CM

## 2020-01-09 DIAGNOSIS — E782 Mixed hyperlipidemia: Secondary | ICD-10-CM

## 2020-01-09 DIAGNOSIS — M25562 Pain in left knee: Secondary | ICD-10-CM

## 2020-01-09 DIAGNOSIS — G8929 Other chronic pain: Secondary | ICD-10-CM

## 2020-01-09 MED ORDER — IBUPROFEN 800 MG PO TABS: 600.0000 mg | ORAL_TABLET | Freq: Three times a day (TID) | ORAL | 1 refills | Status: DC | PRN

## 2020-01-09 MED ORDER — ATORVASTATIN CALCIUM 20 MG PO TABS: 20.0000 mg | ORAL_TABLET | Freq: Every day | ORAL | 3 refills | Status: DC

## 2020-01-09 MED ORDER — GLIPIZIDE 10 MG PO TABS: 10.0000 mg | ORAL_TABLET | Freq: Two times a day (BID) | ORAL | 1 refills | Status: DC

## 2020-01-09 MED ORDER — LISINOPRIL 20 MG PO TABS: 20.0000 mg | ORAL_TABLET | Freq: Every day | ORAL | 1 refills | Status: DC

## 2020-01-09 MED ORDER — METFORMIN HCL 500 MG PO TABS: 1000.0000 mg | ORAL_TABLET | Freq: Two times a day (BID) | ORAL | 1 refills | Status: DC

## 2020-01-09 MED ORDER — HYDROCHLOROTHIAZIDE 25 MG PO TABS: 25.0000 mg | ORAL_TABLET | Freq: Every day | ORAL | 1 refills | Status: DC

## 2020-01-09 NOTE — Progress Notes (Signed)
Knee pain for more than 2 months, has been using cream for pain She can not bend her knees

## 2020-01-09 NOTE — Progress Notes (Signed)
Virtual Visit via Telephone Note  I connected with Susan [name] on 01/09/20 at  8:30 AM EST by telephone and verified that I am speaking with the correct person using two identifiers.   I discussed the limitations, risks, security and privacy concerns of performing an evaluation and management service by telephone and the availability of in person appointments. I also discussed with the patient that there may be a patient responsible charge related to this service. The patient expressed understanding and agreed to proceed.   History of Present Illness: [name] is having a tell visit for bilateral knee pain for over 2 months. She has been using a cream she saw a commercial for on tv. Unable to recall the name but did not work.  She states she is unable to been. She cleans contraction area- repetitive motion.     Observations/Objective: Review of Systems  Musculoskeletal: Positive for joint pain.  All other systems reviewed and are negative.   Assessment and Plan: [name] was seen today for knee pain.  Diagnoses and all orders for this visit:  Essential hypertension Unable to check  Bp at home stated she will purchase a Bp cuff ideal Bp is 130/80,/=. Record reading before medication and after alternating. Asked to bring the log in. -     lisinopril (ZESTRIL) 20 MG tablet; Take 1 tablet (20 mg total) by mouth daily. -     hydrochlorothiazide (HYDRODIURIL) 25 MG tablet; Take 1 tablet (25 mg total) by mouth daily.  Type 2 diabetes mellitus without complication, without long-term current use of insulin (HCC) Discussed low carb diet foods that are high in carbohydrates are the following rice, potatoes, breads, sugars, and pastas.  Reduction in the intake (eating) will assist in lowering your blood sugars. -     metFORMIN (GLUCOPHAGE) 500 MG tablet; Take 2 tablets (1,000 mg total) by mouth 2 (two) times daily with a meal. -     glipiZIDE (GLUCOTROL) 10 MG tablet; Take 1  tablet (10 mg total) by mouth 2 (two) times daily before a meal.  Mixed hyperlipidemia Continue to  decrease your fatty foods, red meat, cheese, milk and increase fiber like whole grains and veggies. You can also add a fiber supplement like Metamucil or Benefiber.  -     atorvastatin (LIPITOR) 20 MG tablet; Take 1 tablet (20 mg total) by mouth daily.  Chronic pain of both knees Work on losing weight to help reduce joint pain. May alternate with heat and ice application for pain relief. May also alternate with acetaminophen and Ibuprofen as prescribed pain relief. Other alternatives include massage, acupuncture and water aerobics.  You must stay active and avoid a sedentary lifestyle.   Follow Up Instructions:    I discussed the assessment and treatment plan with the patient. The patient was provided an opportunity to ask questions and all were answered. The patient agreed with the plan and demonstrated an understanding of the instructions.   The patient was advised to call back or seek an in-person evaluation if the symptoms worsen or if the condition fails to improve as anticipated.  I provided 12 minutes of non-face-to-face time during this encounter.   Grayce Sessions, NP

## 2020-02-17 ENCOUNTER — Ambulatory Visit (INDEPENDENT_AMBULATORY_CARE_PROVIDER_SITE_OTHER): Payer: Self-pay | Admitting: Primary Care

## 2020-02-17 ENCOUNTER — Other Ambulatory Visit: Payer: Self-pay

## 2020-02-17 DIAGNOSIS — M25561 Pain in right knee: Secondary | ICD-10-CM

## 2020-02-17 DIAGNOSIS — Z0130 Encounter for examination of blood pressure without abnormal findings: Secondary | ICD-10-CM

## 2020-02-17 DIAGNOSIS — E119 Type 2 diabetes mellitus without complications: Secondary | ICD-10-CM

## 2020-02-17 DIAGNOSIS — M25562 Pain in left knee: Secondary | ICD-10-CM

## 2020-02-17 DIAGNOSIS — G8929 Other chronic pain: Secondary | ICD-10-CM

## 2020-02-17 MED ORDER — IBUPROFEN 800 MG PO TABS: 600.0000 mg | ORAL_TABLET | Freq: Three times a day (TID) | ORAL | 1 refills | Status: DC | PRN

## 2020-02-17 NOTE — Progress Notes (Signed)
Patient has been called and DOB has been verified. Patient has been screened and transferred to PCP to start phone visit.     

## 2020-02-17 NOTE — Progress Notes (Signed)
Virtual Visit via Telephone Note  I connected with Susan [name] on 02/17/20 at  8:30 AM EST by telephone and verified that I am speaking with the correct person using two identifiers.   I discussed the limitations, risks, security and privacy concerns of performing an evaluation and management service by telephone and the availability of in person appointments. I also discussed with the patient that there may be a patient responsible charge related to this service. The patient expressed understanding and agreed to proceed.   History of Present Illness: Susan [name] is having a tele visit for blood pressure follow up. However, she has not purchase monitor . Complains of bilateral knee pain the pain is worst in the the left than the right and ibuprofen is not helping much. 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 Past Medical History:  Diagnosis Date  . Diabetes 1.5, managed as type 2 (HCC)   . Hypertension   Bilateral knee pain  Current Outpatient Medications on File Prior to Visit  Medication Sig Dispense Refill  . atorvastatin (LIPITOR) 20 MG tablet Take 1 tablet (20 mg total) by mouth daily. 90 tablet 3  . glipiZIDE (GLUCOTROL) 10 MG tablet Take 1 tablet (10 mg total) by mouth 2 (two) times daily before a meal. 180 tablet 1  . hydrochlorothiazide (HYDRODIURIL) 25 MG tablet Take 1 tablet (25 mg total) by mouth daily. 90 tablet 1  . ibuprofen (ADVIL) 800 MG tablet Take 1 tablet (800 mg total) by mouth every 8 (eight) hours as needed for moderate pain. 90 tablet 1  . lisinopril (ZESTRIL) 20 MG tablet Take 1 tablet (20 mg total) by mouth daily. 90 tablet 1  . metFORMIN (GLUCOPHAGE) 500 MG tablet Take 2 tablets (1,000 mg total) by mouth 2 (two) times daily with a meal. 90 tablet 1   No current facility-administered medications on file prior to visit.     Observations/Objective: Review of Systems  Musculoskeletal: Positive for joint pain.       Bilateral  knees  All other systems reviewed and are negative.  Assessment and Plan: Susan [name] was seen today for hypertension.  Diagnoses and all orders for this visit:  Blood pressure check Continue all antihypertensives as prescribed.  Remember to bring in your blood pressure log with you for your follow up appointment.  DASH/Mediterranean Diets are healthier choices for HTN.    Bilateral chronic knee pain  Bilateral knee pain the pain is worst in the the left than the right and ibuprofen is not helping much. Work on losing weight to help reduce joint pain. May alternate with heat and ice application for pain relief. May also alternate with acetaminophen and Ibuprofen as prescribed pain relief. Other alternatives include massage, acupuncture and water aerobics.  You must stay active and avoid a sedentary lifestyle.  Type 2 diabetes mellitus without complication, without long-term current use of insulin (HCC) Patient concerned blood sugars in the morning ranges in the 70's than after she eats breakfast blood sugars range 110-150- explain that is good her medication is working no changes at this time continue to monitor. Hold medication if blood sugars remain below 130 after meals. Blood sugar ranges fasting 90-130.  Other orders -     ibuprofen (ADVIL) 800 MG tablet; Take 1 tablet (800 mg total) by mouth every 8 (eight) hours as needed for moderate pain.    Follow Up Instructions: Call office to schedule Bp tele appointment after purchasing monitor -  4 weeks out.   I discussed the assessment and treatment plan with the patient. The patient was provided an opportunity to ask questions and all were answered. The patient agreed with the plan and demonstrated an understanding of the instructions.   The patient was advised to call back or seek an in-person evaluation if the symptoms worsen or if the condition fails to improve as anticipated.  I provided 12 minutes of non-face-to-face time during  this encounter.   Kerin Perna, NP

## 2020-03-02 ENCOUNTER — Telehealth (INDEPENDENT_AMBULATORY_CARE_PROVIDER_SITE_OTHER): Payer: Self-pay

## 2020-03-02 NOTE — Telephone Encounter (Signed)
Sent to PCP ?

## 2020-03-02 NOTE — Telephone Encounter (Signed)
Patient called to inform she had purchased her blood pressure monitor right after her appointment on March the 12. Patient states that yesterday she felt shaking and dizzy. Patient states she would try to speak but it seem like her jaw would lock. Patient states this happen to her as well during the night and she was able to check her blood sugar which was 58 and her blood pressure was 97/58 or 60. Patient states she took a soda to help bring up her blood sugars last night. Patient states this morning she rechecked her blood glucose and it was 150 and her blood pressure was 137/80.   Please advice [phone]

## 2020-03-05 NOTE — Telephone Encounter (Signed)
Patient needs to drink more water and hold diabetes medication if fasting CBG is lower than 100 . Hold blood pressure medication if less than 100/60

## 2020-03-06 NOTE — Telephone Encounter (Signed)
Call placed to patient using pacific interpreter [phone]) left voicemail notifying patient to drink more water. If sugar without eating is less than 100 do not take diabetic medications. If blood pressure is less than 100/60 do not take blood pressure medication.return call to [phone] with any questions or concerns. Maryjean Morn, CMA

## 2020-03-16 ENCOUNTER — Encounter (INDEPENDENT_AMBULATORY_CARE_PROVIDER_SITE_OTHER): Payer: Self-pay | Admitting: Primary Care

## 2020-03-16 ENCOUNTER — Other Ambulatory Visit: Payer: Self-pay

## 2020-03-16 ENCOUNTER — Telehealth (INDEPENDENT_AMBULATORY_CARE_PROVIDER_SITE_OTHER): Payer: Self-pay | Admitting: Primary Care

## 2020-03-16 DIAGNOSIS — Z0130 Encounter for examination of blood pressure without abnormal findings: Secondary | ICD-10-CM

## 2020-03-16 NOTE — Progress Notes (Signed)
Virtual Visit via Telephone Note  I connected with Susan [name] on 03/16/20 at 11:10 AM EDT by telephone and verified that I am speaking with the correct person using two identifiers.   I discussed the limitations, risks, security and privacy concerns of performing an evaluation and management service by telephone and the availability of in person appointments. I also discussed with the patient that there may be a patient responsible charge related to this service. The patient expressed understanding and agreed to proceed.  ID [id number] Susan [name] History of Present Illness: Susan [name] is having tele visit for blood pressure follow up. She does not take her blood pressure on a regular bases nor does she keep a log. Bp reading today is 126/80 single reading is well controlled. She denies shortness of breath, headaches, chest pain or lower extremity edema or cough. Past Medical History:  Diagnosis Date  . Diabetes 1.5, managed as type 2 (HCC)   . Hypertension    Current Outpatient Medications on File Prior to Visit  Medication Sig Dispense Refill  . atorvastatin (LIPITOR) 20 MG tablet Take 1 tablet (20 mg total) by mouth daily. 90 tablet 3  . glipiZIDE (GLUCOTROL) 10 MG tablet Take 1 tablet (10 mg total) by mouth 2 (two) times daily before a meal. 180 tablet 1  . hydrochlorothiazide (HYDRODIURIL) 25 MG tablet Take 1 tablet (25 mg total) by mouth daily. 90 tablet 1  . ibuprofen (ADVIL) 800 MG tablet Take 1 tablet (800 mg total) by mouth every 8 (eight) hours as needed for moderate pain. 90 tablet 1  . lisinopril (ZESTRIL) 20 MG tablet Take 1 tablet (20 mg total) by mouth daily. 90 tablet 1  . metFORMIN (GLUCOPHAGE) 500 MG tablet Take 2 tablets (1,000 mg total) by mouth 2 (two) times daily with a meal. 90 tablet 1   No current facility-administered medications on file prior to visit.    Observations/Objective: Review of Systems  All other systems reviewed and are  negative.  Assessment and Plan: Blood pressure check We have discussed target BP range and blood pressure goal. 130/80 . I have advised patient to check BP regularly and to call us back or report to clinic if the numbers are consistently higher than 160/90's. We discussed the importance of compliance with medical therapy and DASH diet recommended, consequences of uncontrolled hypertension discussed.  - continue  HCTZ 25mg  daily and Lisinopril 20mg  daily . Currently has refill on all her medications  Follow Up Instructions:    I discussed the assessment and treatment plan with the patient. The patient was provided an opportunity to ask questions and all were answered. The patient agreed with the plan and demonstrated an understanding of the instructions.   The patient was advised to call back or seek an in-person evaluation if the symptoms worsen or if the condition fails to improve as anticipated.  I provided 10 minutes of non-face-to-face time during this encounter.   , NP

## 2020-03-16 NOTE — Progress Notes (Signed)
Bp this morning on medication was 126/80

## 2020-05-04 ENCOUNTER — Telehealth (INDEPENDENT_AMBULATORY_CARE_PROVIDER_SITE_OTHER): Payer: Self-pay

## 2020-05-04 NOTE — Telephone Encounter (Signed)
Patient called to request a refill on all of her medications.   Patient uses [facility] paharmacy  Please advice [phone]

## 2020-05-04 NOTE — Telephone Encounter (Signed)
Please have patient contact pharmacy for refills. Prescriptions where written for 90 days at last fill and each prescription had refills.

## 2020-06-08 ENCOUNTER — Other Ambulatory Visit (INDEPENDENT_AMBULATORY_CARE_PROVIDER_SITE_OTHER): Payer: Self-pay | Admitting: Primary Care

## 2020-06-15 ENCOUNTER — Ambulatory Visit: Payer: Self-pay

## 2020-06-15 ENCOUNTER — Other Ambulatory Visit: Payer: Self-pay

## 2020-07-05 ENCOUNTER — Other Ambulatory Visit (INDEPENDENT_AMBULATORY_CARE_PROVIDER_SITE_OTHER): Payer: Self-pay | Admitting: Primary Care

## 2020-07-16 ENCOUNTER — Encounter (INDEPENDENT_AMBULATORY_CARE_PROVIDER_SITE_OTHER): Payer: Self-pay | Admitting: Primary Care

## 2020-07-16 ENCOUNTER — Ambulatory Visit (INDEPENDENT_AMBULATORY_CARE_PROVIDER_SITE_OTHER): Payer: Self-pay | Admitting: Primary Care

## 2020-07-16 ENCOUNTER — Other Ambulatory Visit: Payer: Self-pay | Admitting: Primary Care

## 2020-07-16 ENCOUNTER — Other Ambulatory Visit: Payer: Self-pay

## 2020-07-16 VITALS — BP 146/77 | HR 94 | Temp 98.5°F | Ht 59.0 in | Wt 173.0 lb

## 2020-07-16 DIAGNOSIS — M25562 Pain in left knee: Secondary | ICD-10-CM

## 2020-07-16 DIAGNOSIS — E119 Type 2 diabetes mellitus without complications: Secondary | ICD-10-CM

## 2020-07-16 DIAGNOSIS — M25561 Pain in right knee: Secondary | ICD-10-CM

## 2020-07-16 DIAGNOSIS — I10 Essential (primary) hypertension: Secondary | ICD-10-CM

## 2020-07-16 DIAGNOSIS — E782 Mixed hyperlipidemia: Secondary | ICD-10-CM

## 2020-07-16 MED ORDER — METFORMIN HCL 500 MG PO TABS: 1000.0000 mg | ORAL_TABLET | Freq: Two times a day (BID) | ORAL | 1 refills | Status: DC

## 2020-07-16 MED ORDER — ATORVASTATIN CALCIUM 20 MG PO TABS: 20.0000 mg | ORAL_TABLET | Freq: Every day | ORAL | 3 refills | Status: DC

## 2020-07-16 MED ORDER — HYDROCHLOROTHIAZIDE 25 MG PO TABS: 25.0000 mg | ORAL_TABLET | Freq: Every day | ORAL | 1 refills | Status: DC

## 2020-07-16 MED ORDER — LISINOPRIL 20 MG PO TABS: 20.0000 mg | ORAL_TABLET | Freq: Every day | ORAL | 1 refills | Status: DC

## 2020-07-16 MED ORDER — GLIPIZIDE 10 MG PO TABS: 10.0000 mg | ORAL_TABLET | Freq: Two times a day (BID) | ORAL | 1 refills | Status: DC

## 2020-07-16 NOTE — Patient Instructions (Signed)
Cmo controlar su hipertensin Managing Your Hypertension La hipertensin se denomina usualmente presin arterial alta. Ocurre cuando la sangre presiona contra las paredes de las arterias con demasiada fuerza. Las arterias son los vasos sanguneos que transportan la sangre desde el corazn hacia todas las partes del cuerpo. La hipertensin hace que el corazn haga ms esfuerzo para bombear sangre y puede provocar que las arterias se estrechen o endurezcan. La hipertensin no tratada o no controlada puede causar infarto de miocardio, accidente cerebrovascular, enfermedad renal y otros problemas. Qu son las lecturas de presin arterial? Una lectura de la presin arterial consiste de un nmero ms alto sobre un nmero ms bajo. En condiciones ideales, la presin arterial debe estar por debajo de 120/80. El primer nmero ("superior") es la presin sistlica. Es la medida de la presin de las arterias cuando el corazn late. El segundo nmero ("inferior") es la presin diastlica. Es la medida de la presin en las arterias cuando el corazn se relaja. Qu significa mi lectura de presin arterial? La presin arterial se clasifica en cuatro etapas. Sobre la base de la lectura de su presin arterial, el mdico puede usar las siguientes etapas para determinar si necesita tratamiento y de qu tipo. La presin sistlica y la presin diastlica se miden en una unidad llamada mm Hg. Normal  Presin sistlica: por debajo de 120.  Presin diastlica: por debajo de 80. Elevada  Presin sistlica: 120-129.  Presin diastlica: por debajo de 80. Etapa 1 de hipertensin  Presin sistlica: 130-139.  Presin diastlica: 80-89. Etapa 2 de hipertensin  Presin sistlica: 140 o ms.  Presin diastlica: 90 o ms. Cules son los riesgos para la salud asociados con la hipertensin? Controlar la hipertensin es una responsabilidad importante. La hipertensin no controlada puede causar:  Infarto de  miocardio.  Accidente cerebrovascular.  Debilitamiento de los vasos sanguneos (aneurisma).  Insuficiencia cardaca.  Dao renal.  Dao ocular.  Sndrome metablico.  Problemas de memoria y concentracin. Qu cambios puedo hacer para controlar mi hipertensin? La hipertensin se puede controlar haciendo cambios en el estilo de vida y, posiblemente, tomando medicamentos. Su mdico le ayudar a crear un plan para bajar la presin arterial al rango normal. Comida y bebida   Siga una dieta con alto contenido de fibras y potasio, y con bajo contenido de sal (sodio), azcar agregada y grasas. Un ejemplo de plan alimenticio es la dieta DASH (Dietary Approaches to Stop Hypertension, Mtodos alimenticios para detener la hipertensin). Para alimentarse de esta manera: ? Coma mucha fruta y verdura fresca. Trate de que la mitad del plato de cada comida sea de frutas y verduras. ? Coma cereales integrales, como pasta integral, arroz integral y pan integral. Llene aproximadamente un cuarto del plato con cereales integrales. ? Consuma productos lcteos con bajo contenido de grasa. ? Evite la ingesta de cortes de carne grasa, carne procesada o curada, y carne de ave con piel. Llene aproximadamente un cuarto del plato con protenas magras, como pescado, pollo sin piel, frijoles, huevos y tofu. ? Evite ingerir alimentos prehechos o procesados. En general, estos tienen mayor cantidad de sodio, azcar agregada y grasa.  Reduzca su ingesta diaria de sodio. La mayora de las personas que tienen hipertensin deben comer menos de 1500 mg de sodio por da.  Limite el consumo de alcohol a no ms de 1 medida por da si es mujer y no est embarazada y a 2 medidas por da si es hombre. Una medida equivale a 12onzas de cerveza, 5onzas de vino   o 1onzas de bebidas alcohlicas de alta graduacin. Estilo de vida  Trabaje con su mdico para mantener un peso saludable o perder peso. Pregntele cual es su peso  recomendado.  Realice al menos 30 minutos de ejercicio que haga que se acelere su corazn (ejercicio aerbico) la mayora de los das de la semana. Estas actividades pueden incluir caminar, nadar o andar en bicicleta.  Incluya ejercicios para fortalecer sus msculos (ejercicios de resistencia), como levantamiento de pesas, como parte de su rutina semanal de ejercicios. Intente realizar 30minutos de este tipo de ejercicios al menos tres das a la semana.  No consuma ningn producto que contenga nicotina o tabaco, como cigarrillos y cigarrillos electrnicos. Si necesita ayuda para dejar de fumar, consulte al mdico.  Controle las enfermedades a largo plazo (crnicas), como el colesterol alto o la diabetes. Control  Contrlese la presin arterial en su casa segn las indicaciones del mdico. La presin arterial deseada puede variar en funcin de las enfermedades, la edad y otros factores personales.  Contrlese la presin arterial de manera regular, en la frecuencia indicada por su mdico. Trabaje con su mdico  Revise con su mdico todos los medicamentos que toma ya que puede haber efectos secundarios o interacciones.  Hable con su mdico acerca de la dieta, hbitos de ejercicio y otros factores del estilo de vida que pueden contribuir a la hipertensin.  Consulte a su mdico regularmente. Su mdico puede ayudarle a crear y ajustar su plan para controlar la hipertensin. Debo tomar un medicamento para controlar mi presin arterial? El mdico puede recetarle medicamentos si los cambios en el estilo de vida no son suficientes para lograr controlar la presin arterial y si:  Su presin arterial sistlica es de 130 o ms.  Su presin arterial diastlica es de 80 o ms. Tome los medicamentos solamente como se lo haya indicado el mdico. Siga cuidadosamente las indicaciones. Los medicamentos para la presin arterial deben tomarse segn las indicaciones. Los medicamentos pierden eficacia al  omitir las dosis. El hecho de omitir las dosis tambin aumenta el riesgo de otros problemas. Comunquese con un mdico si:  Piensa que tiene una reaccin alrgica a los medicamentos que ha tomado.  Tiene dolores de cabeza frecuentes (recurrentes).  Siente mareos.  Tiene hinchazn en los tobillos.  Tiene problemas de visin. Solicite ayuda de inmediato si:  Siente un dolor de cabeza intenso o confusin.  Siente debilidad inusual, adormecimiento o que se desmayar.  Siente un dolor intenso en el pecho o el abdomen.  Vomita repetidas veces.  Tiene dificultad para respirar. Resumen  La hipertensin se produce cuando la sangre bombea en las arterias con mucha fuerza. Si esta afeccin no se controla, podra correr riesgo de tener complicaciones graves.  La presin arterial deseada puede variar en funcin de las enfermedades, la edad y otros factores personales. Para la mayora de las personas, una presin arterial normal es menor que 120/80.  La hipertensin se puede controlar mediante cambios en el estilo de vida, tomando medicamentos, o ambas cosas. Los cambios en el estilo de vida incluyen prdida de peso, ingerir alimentos sanos, seguir una dieta baja en sodio, hacer ms ejercicio y limitar el consumo de alcohol. Esta informacin no tiene como fin reemplazar el consejo del mdico. Asegrese de hacerle al mdico cualquier pregunta que tenga. Document Revised: 02/02/2017 Document Reviewed: 11/05/2016 [employer]vier Patient Education  2020 [employer]  

## 2020-07-16 NOTE — Progress Notes (Signed)
Acute Office Visit  Subjective:    Patient ID: Susan [name], female    DOB: 1976/05/03, 44 y.o.   MRN: [id number]  Chief Complaint  Patient presents with  . Blood Pressure Check    HPI Ms. Giara Mcgaughey is a 44 year old Hispanic female Spanish speaking only. [facility] [id number]).she is in for the management of HTN.  Denies shortness of breath, headaches, chest pain or lower extremity edema.  Past Medical History:  Diagnosis Date  . Diabetes 1.5, managed as type 2 (HCC)   . Hypertension     No past surgical history on file.  Family History  Problem Relation Age of Onset  . Diabetes Mother   . Diabetes Father   . Hypertension Father     Social History   Socioeconomic History  . Marital status: Single    Spouse name: Not on file  . Number of children: Not on file  . Years of education: Not on file  . Highest education level: 5th grade  Occupational History  . Not on file  Tobacco Use  . Smoking status: Never Smoker  . Smokeless tobacco: Never Used  Vaping Use  . Vaping Use: Never used  Substance and Sexual Activity  . Alcohol use: No  . Drug use: No  . Sexual activity: Yes    Birth control/protection: Condom  Other Topics Concern  . Not on file  Social History Narrative  . Not on file   Social Determinants of Health   Financial Resource Strain:   . Difficulty of Paying Living Expenses:   Food Insecurity:   . Worried About Programme researcher, broadcasting/film/video in the Last Year:   . Barista in the Last Year:   Transportation Needs: No Transportation Needs  . Lack of Transportation (Medical): No  . Lack of Transportation (Non-Medical): No  Physical Activity:   . Days of Exercise per Week:   . Minutes of Exercise per Session:   Stress:   . Feeling of Stress :   Social Connections:   . Frequency of Communication with Friends and Family:   . Frequency of Social Gatherings with Friends and Family:   . Attends Religious Services:   . Active  Member of Clubs or Organizations:   . Attends Banker Meetings:   Marland Kitchen Marital Status:   Intimate Partner Violence:   . Fear of Current or Ex-Partner:   . Emotionally Abused:   Marland Kitchen Physically Abused:   . Sexually Abused:     Outpatient Medications Prior to Visit  Medication Sig Dispense Refill  . atorvastatin (LIPITOR) 20 MG tablet Take 1 tablet (20 mg total) by mouth daily. 90 tablet 3  . glipiZIDE (GLUCOTROL) 10 MG tablet Take 1 tablet (10 mg total) by mouth 2 (two) times daily before a meal. 180 tablet 1  . hydrochlorothiazide (HYDRODIURIL) 25 MG tablet Take 1 tablet (25 mg total) by mouth daily. 90 tablet 1  . ibuprofen (ADVIL) 800 MG tablet Take 1 tablet (800 mg total) by mouth every 8 (eight) hours as needed for moderate pain. 90 tablet 1  . lisinopril (ZESTRIL) 20 MG tablet Take 1 tablet (20 mg total) by mouth daily. 90 tablet 1  . metFORMIN (GLUCOPHAGE) 500 MG tablet Take 2 tablets (1,000 mg total) by mouth 2 (two) times daily with a meal. 90 tablet 1   No facility-administered medications prior to visit.    No Known Allergies  Review of Systems  All  other systems reviewed and are negative.      Objective:    Physical Exam Vitals reviewed.  Constitutional:      Appearance: She is obese.  Eyes:     Extraocular Movements: Extraocular movements intact.  Pulmonary:     Effort: Pulmonary effort is normal.     Breath sounds: Normal breath sounds.  Abdominal:     General: Bowel sounds are normal.  Musculoskeletal:        General: Normal range of motion.     Cervical back: Normal range of motion.  Skin:    General: Skin is warm and dry.  Neurological:     Mental Status: She is alert and oriented to person, place, and time.  Psychiatric:        Mood and Affect: Mood normal.        Behavior: Behavior normal.        Thought Content: Thought content normal.        Judgment: Judgment normal.     BP (!) 146/77 (BP Location: Right Arm, Patient Position:  Sitting, Cuff Size: Normal)   Pulse 94   Temp 98.5 F (36.9 C) (Oral)   Ht 4\' 11"  (1.499 m)   Wt 173 lb (78.5 kg)   LMP 06/28/2020 (Exact Date)   SpO2 100%   BMI 34.94 kg/m  Wt Readings from Last 3 Encounters:  07/16/20 173 lb (78.5 kg)  12/21/19 169 lb 12.8 oz (77 kg)  11/10/19 171 lb (77.6 kg)    Health Maintenance Due  Topic Date Due  . Hepatitis C Screening  Never done  . COVID-19 Vaccine (1) Never done  . INFLUENZA VACCINE  07/08/2020    There are no preventive care reminders to display for this patient.   No results found for: TSH Lab Results  Component Value Date   WBC 11.8 (H) 10/04/2019   HGB 13.0 10/04/2019   HCT 39.7 10/04/2019   MCV 84 10/04/2019   PLT 217 10/04/2019   Lab Results  Component Value Date   NA 134 10/04/2019   K 4.3 10/04/2019   CO2 23 10/04/2019   GLUCOSE 345 (H) 10/04/2019   BUN 7 10/04/2019   CREATININE 0.64 10/04/2019   BILITOT <0.2 10/04/2019   ALKPHOS 126 (H) 10/04/2019   AST 165 (H) 10/04/2019   ALT 160 (H) 10/04/2019   PROT 7.8 10/04/2019   ALBUMIN 4.4 10/04/2019   CALCIUM 9.2 10/04/2019   ANIONGAP 7 11/07/2015   Lab Results  Component Value Date   CHOL 206 (H) 10/04/2019   Lab Results  Component Value Date   HDL 32 (L) 10/04/2019   Lab Results  Component Value Date   LDLCALC 135 (H) 10/04/2019   Lab Results  Component Value Date   TRIG 215 (H) 10/04/2019   Lab Results  Component Value Date   CHOLHDL 6.4 (H) 10/04/2019   Lab Results  Component Value Date   HGBA1C 5.9 (A) 12/21/2019       Assessment & Plan:   Josie was seen today for blood pressure check.  Diagnoses and all orders for this visit:  Essential hypertension Blood pressure is slightly elevated systolic 144.  She is currently on lisinopril 20 mg and HCTZ 25 mg daily.  No change in medication goal is 130/80 which will be obtained by exercising and monitoring sodium intake.  Will reevaluate in 3 months  Type 2 diabetes mellitus without  complication, without long-term current use of insulin (HCC) : Less than 6.5  hemoglobin A1c.Continue low carbohydrates are the following rice, potatoes, breads, sugars, and pastas.  Reduction in the intake (eating) will assist in lowering your blood sugars.  Arthralgia of both knees Work on losing weight to help reduce joint pain. May alternate with heat and ice application for pain relief. May also alternate with acetaminophen and Ibuprofen as prescribed pain relief. Other alternatives include massage, acupuncture and water aerobics.  You must stay active and avoid a sedentary lifestyle.   Grayce Sessions, NP

## 2020-09-10 ENCOUNTER — Other Ambulatory Visit (INDEPENDENT_AMBULATORY_CARE_PROVIDER_SITE_OTHER): Payer: Self-pay | Admitting: Primary Care

## 2020-09-10 DIAGNOSIS — E119 Type 2 diabetes mellitus without complications: Secondary | ICD-10-CM

## 2020-09-12 ENCOUNTER — Other Ambulatory Visit: Payer: Self-pay

## 2020-09-12 ENCOUNTER — Ambulatory Visit (HOSPITAL_COMMUNITY)
Admission: EM | Admit: 2020-09-12 | Discharge: 2020-09-12 | Disposition: A | Discharge status: Home / Self Care | Payer: No Typology Code available for payment source | Attending: Internal Medicine | Admitting: Internal Medicine

## 2020-09-12 ENCOUNTER — Encounter (HOSPITAL_COMMUNITY): Payer: Self-pay

## 2020-09-12 DIAGNOSIS — M1712 Unilateral primary osteoarthritis, left knee: Secondary | ICD-10-CM

## 2020-09-12 NOTE — ED Triage Notes (Signed)
Pt present left knee pain. Symptoms started 6 months ago. Pt is limping on her knee because of  The pain.

## 2020-09-14 NOTE — ED Provider Notes (Signed)
[facility]    CSN: [id number] Arrival date & time: 09/12/20  1857      History   Chief Complaint Chief Complaint  Patient presents with  . Knee Pain    left knee     HPI Susan [name] is a 44 y.o. female with a history of left knee pain of several months duration comes to the urgent care to be evaluated for left knee pain.  Pain is of moderate severity, aggravated by movement, over-the-counter ibuprofen has not helped relieve the pain completely and she currently walks with a limp.  She denies any fall or trauma to the knee.  No joint swelling.  Patient is joint is particularly stiff when she wakes up in the morning and takes about 45 minutes for it to ease up.  No redness around the knee.   HPI  Past Medical History:  Diagnosis Date  . Diabetes 1.5, managed as type 2 (HCC)   . Hypertension     Patient Active Problem List   Diagnosis Date Noted  . Screening breast examination 11/10/2019    History reviewed. No pertinent surgical history.  OB History    Gravida  5   Para  4   Term  4   Preterm      AB  1   Living  4     SAB      TAB  1   Ectopic      Multiple      Live Births               Home Medications    Prior to Admission medications   Medication Sig Start Date End Date Taking? Authorizing Provider  atorvastatin (LIPITOR) 20 MG tablet Take 1 tablet (20 mg total) by mouth daily. 07/16/20   Grayce Sessions, NP  glipiZIDE (GLUCOTROL) 10 MG tablet Take 1 tablet (10 mg total) by mouth 2 (two) times daily before a meal. 07/16/20   Grayce Sessions, NP  hydrochlorothiazide (HYDRODIURIL) 25 MG tablet Take 1 tablet (25 mg total) by mouth daily. 07/16/20   Grayce Sessions, NP  ibuprofen (ADVIL) 800 MG tablet Take 1 tablet (800 mg total) by mouth every 8 (eight) hours as needed for moderate pain. 02/17/20   Grayce Sessions, NP  lisinopril (ZESTRIL) 20 MG tablet Take 1 tablet (20 mg total) by mouth daily. 07/16/20   Grayce Sessions, NP  metFORMIN (GLUCOPHAGE) 500 MG tablet TAKE 2 TABLETS (1,000 MG TOTAL) BY MOUTH 2 (TWO) TIMES DAILY WITH A MEAL. 09/10/20   Grayce Sessions, NP    Family History Family History  Problem Relation Age of Onset  . Diabetes Mother   . Diabetes Father   . Hypertension Father     Social History Social History   Tobacco Use  . Smoking status: Never Smoker  . Smokeless tobacco: Never Used  Vaping Use  . Vaping Use: Never used  Substance Use Topics  . Alcohol use: No  . Drug use: No     Allergies   Patient has no known allergies.   Review of Systems Review of Systems  Gastrointestinal: Negative.   Musculoskeletal: Positive for arthralgias and joint swelling. Negative for back pain.  Neurological: Negative.      Physical Exam Triage Vital Signs ED Triage Vitals  Enc Vitals Group     BP 09/12/20 2032 (!) 143/74     Pulse Rate 09/12/20 2032 82     Resp  09/12/20 2032 18     Temp 09/12/20 2032 98.9 F (37.2 C)     Temp Source 09/12/20 2032 Oral     SpO2 09/12/20 2032 100 %     Weight --      Height --      Head Circumference --      Peak Flow --      Pain Score 09/12/20 2031 7     Pain Loc --      Pain Edu? --      Excl. in GC? --    No data found.  Updated Vital Signs BP (!) 143/74 (BP Location: Right Arm)   Pulse 82   Temp 98.9 F (37.2 C) (Oral)   Resp 18   SpO2 100%   Visual Acuity Right Eye Distance:   Left Eye Distance:   Bilateral Distance:    Right Eye Near:   Left Eye Near:    Bilateral Near:     Physical Exam Vitals and nursing note reviewed.  Constitutional:      Appearance: She is obese.  Pulmonary:     Effort: Pulmonary effort is normal.     Breath sounds: Normal breath sounds.  Musculoskeletal:        General: Tenderness present. No deformity or signs of injury. Normal range of motion.     Comments: Crepitus with full range of motion of the left knee  Skin:    General: Skin is warm.  Neurological:     General:  No focal deficit present.     Mental Status: She is alert and oriented to person, place, and time.      UC Treatments / Results  Labs (all labs ordered are listed, but only abnormal results are displayed) Labs Reviewed - No data to display  EKG   Radiology No results found.  Procedures Procedures (including critical care time)  Medications Ordered in UC Medications - No data to display  Initial Impression / Assessment and Plan / UC Course  I have reviewed the triage vital signs and the nursing notes.  Pertinent labs & imaging results that were available during my care of the patient were reviewed by me and considered in my medical decision making (see chart for details).     1.  Osteoarthritis of the left knee: Patient has had no relief by using over-the-counter medications She is advised to follow-up with orthopedic surgery for further management She will need imaging and hopefully steroid injections to help with any.  Gentle range of motion exercises encouraged. Final Clinical Impressions(s) / UC Diagnoses   Final diagnoses:  Primary osteoarthritis of left knee   Discharge Instructions   None    ED Prescriptions    None     PDMP not reviewed this encounter.   Merrilee Jansky, MD 09/14/20 2235

## 2020-10-16 ENCOUNTER — Encounter (INDEPENDENT_AMBULATORY_CARE_PROVIDER_SITE_OTHER): Payer: Self-pay | Admitting: Primary Care

## 2020-10-16 ENCOUNTER — Ambulatory Visit (INDEPENDENT_AMBULATORY_CARE_PROVIDER_SITE_OTHER): Payer: Self-pay | Admitting: Primary Care

## 2020-10-16 ENCOUNTER — Other Ambulatory Visit: Payer: Self-pay | Admitting: Primary Care

## 2020-10-16 ENCOUNTER — Other Ambulatory Visit: Payer: Self-pay

## 2020-10-16 VITALS — BP 133/80 | HR 84 | Temp 97.3°F | Ht 59.0 in | Wt 170.2 lb

## 2020-10-16 DIAGNOSIS — I10 Essential (primary) hypertension: Secondary | ICD-10-CM

## 2020-10-16 DIAGNOSIS — E119 Type 2 diabetes mellitus without complications: Secondary | ICD-10-CM

## 2020-10-16 DIAGNOSIS — Z6835 Body mass index (BMI) 35.0-35.9, adult: Secondary | ICD-10-CM

## 2020-10-16 DIAGNOSIS — E782 Mixed hyperlipidemia: Secondary | ICD-10-CM

## 2020-10-16 LAB — POCT GLYCOSYLATED HEMOGLOBIN (HGB A1C): Hemoglobin A1C: 5.9 % — AB (ref 4.0–5.6)

## 2020-10-16 MED ORDER — METFORMIN HCL 500 MG PO TABS: 500.0000 mg | ORAL_TABLET | Freq: Two times a day (BID) | ORAL | 1 refills | Status: DC

## 2020-10-16 NOTE — Patient Instructions (Signed)
Metformin 500 mg DOS VECES AL DIA  Prediabetes La prediabetes es la afeccin de tener un nivel de azcar en la sangre (glucemia ms alto de lo normal, aunque no lo suficientemente alto para recibir un diagnstico de diabetes tipo2. El hecho de ser prediabtico lo pone en riesgo de desarrollar diabetes tipo 2 (diabetes mellitus tipo2). La prediabetes puede denominarse intolerancia a la glucosa o alteracin de la glucosa en ayunas. Generalmente, la prediabetes no causa sntomas. El mdico puede diagnosticar esta afeccin por los anlisis de [place]. Es posible que le realicen un anlisis para determinar si tiene prediabetes si tiene sobrepeso y al menos algn otro factor de riesgo de prediabetes. Qu es la glucemia y cmo se mide? La glucemia es la cantidad de glucosa presente en el torrente sanguneo. La glucosa proviene de los alimentos que ingiere que contienen azcares y almidones (hidratos de carbono), que el cuerpo descompone en glucosa. El nivel de glucemia se puede medir en mg/dl (miligramos por decilitro) o mmol/l (milimoles por litro). La glucemia puede comprobarse con uno o ms de los siguientes anlisis de sangre:  Prueba de la glucemia en ayunas. No se le permitir comer (tendr que hacer ayuno) durante 8horas o ms antes de que se le tome una [place]. ? El rango normal de glucemia en ayunas es de 70 a 100mg /dl (3,9 a ).  Una prueba de sangre de A1c (hemoglobina A1c). Esta prueba proporciona informacin sobre el control de la glucemia durante los ltimos 2 o 4,[id number].  Prueba de tolerancia a la glucosa oral (PTGO). Esta prueba mide la glucemia en dos momentos: ? Despus de ayunar. Este es el valor inicial. ? Dos horas despus de tomar una bebida que contiene glucosa. Pueden diagnosticarle prediabetes en los siguientes casos:  Si su glucemia en ayunas es de 100 a 125 mg/dl (5,6 a 6,9 mmol/l).  Si su nivel de A1c es de 5,7 a 6,4%.  Si su resultado de la PTGO es  de 140 a 199mg /dl (7,8 a ). Estas pruebas de sangre se pueden repetir para . Cmo puede afectarme esta enfermedad? El pncreas produce una hormona (insulina) que ayuda a 63OVFI/E glucosa del torrente sanguneo al interior de las clulas. Cuando las clulas del cuerpo no responden adecuadamente a la insulina que el cuerpo produce (resistencia a la insulina), se acumula exceso de glucosa en la sangre en lugar de entrar en las clulas. Como [place], se puede producir un nivel alto de glucemia (hiperglucemia), lo cual puede causar muchas complicaciones. La hiperglucemia es un sntoma de prediabetes. Tener la glucemia alta durante mucho tiempo es peligroso. Demasiada glucosa en la sangre puede daar los nervios y los vasos sanguneos. El dao prolongado puede derivar en complicaciones de la diabetes, que pueden incluir:  Enfermedad cardaca.  Accidente cerebrovascular.  Ceguera.  Enfermedad renal.  Depresin.  Circulacin deficiente en los pies y las piernas, lo cual podra ocasionar una extirpacin quirrgica (amputacin) en casos graves. Qu puede aumentar el riesgo? Algunos de los factores de riesgo de prediabetes son los siguientes:  Camuns un familiar con diabetes tipo2.  Tener exceso de [place] u obesidad.  Ser mayor de 44aos de edad.  Ser descendiente de indgenas norteamericanos, afroamericanos, hispanos o latinos, o asiticos o isleos del Pac[employer].  Tener un estilo de vida inactivo (sedentario).  Tener antecedentes de enfermedades cardacas.  En las mujeres, tener antecedentes de diabetes gestacional o sndrome del ovario poliqustico (SOP).  Tener niveles bajos del colesterol bueno (HDL-C) o niveles  altos de [employer] (triglicridos).  Tener presin arterial alta. Qu puedo hacer para prevenir la diabetes?      Haga actividad fsica. ? Haga actividad fsica de intensidad moderada durante o ms 5das por  semana o con la frecuencia que le indique su mdico. Esto podra incluir caminatas dinmicas, ciclismo o gimnasia acutica. ? Pregntele al mdico qu actividades son seguras para usted. Una combinacin de actividades puede ser la mejor opcin, por ejemplo, caminar, practicar natacin, andar en bicicleta y hacer entrenamiento de fuerza.  Baje de [employer] se lo haya indicado el mdico. ? Bajar entre el 5% y el 7% del peso corporal puede revertir la resistencia a la insulina. ? El mdico puede determinar cunto peso tiene que perder y Restaurant manager, fast food a que adelgace de Wellsite geologist segura.  Siga un plan de alimentacin saludable. Este incluye consumir protenas magras, hidratos de carbono complejos, frutas y verduras frescas, productos lcteos con bajo contenido de grasa y grasas saludables. ? Siga las indicaciones del mdico respecto de las restricciones de comidas o bebidas. ? Programe una cita con un especialista en alimentacin y nutricin (nutricionista certificado) para que lo ayude a Doctor, hospital plan de alimentacin saludable adecuado para usted.  No fume ni consuma ningn producto que contenga tabaco, lo que incluye cigarrillos, tabaco de Theatre manager y Administrator, Civil Service. Si necesita ayuda para dejar de fumar, consulte al [employer].  [employer] recetados y de venta libre como se lo haya indicado el mdico. Posiblemente le receten medicamentos para ayudarlo a reducir el riesgo de tener diabetes tipo2.  Concurra a todas las visitas de [address] se lo haya indicado el mdico. Esto es importante. Resumen  La prediabetes es la afeccin de tener un nivel de azcar en la sangre (glucemia ms alto de lo normal, aunque no lo suficientemente alto para recibir un diagnstico de diabetes tipo2.  El hecho de ser prediabtico lo pone en riesgo de desarrollar diabetes tipo 2 (diabetes mellitus tipo2).  Para ayudar a prevenir la diabetes tipo2, haga cambios en el estilo de vida, como realizar  actividad fsica y comer alimentos saludables. Baje de [employer] se lo haya indicado el mdico. Esta informacin no tiene Theme park manager el consejo del mdico. Asegrese de hacerle al mdico cualquier pregunta que tenga. Document Revised: 02/22/2018 Document Reviewed: 01/15/2016 Elsevier Patient Education  2020 [employer].

## 2020-10-16 NOTE — Progress Notes (Signed)
Established Patient Office Visit  Subjective:  Patient ID: Susan [name], female    DOB: Aug 27, 1976  Age: 44 y.o. MRN: [id number]  CC:  Chief Complaint  Patient presents with  . Hypertension    HPI Ms Velvia Mehrer is a 44 year old old Hispanic female interpreter used Rowe Pavy [phone] she does speak and understand some English presents for follow-up on blood pressure and management of hypertension.  She denies shortness of breath, headaches, chest pain or lower extremity edema.  Past Medical History:  Diagnosis Date  . Diabetes 1.5, managed as type 2 (HCC)   . Hypertension     No past surgical history on file.  Family History  Problem Relation Age of Onset  . Diabetes Mother   . Diabetes Father   . Hypertension Father     Social History   Socioeconomic History  . Marital status: Single    Spouse name: Not on file  . Number of children: Not on file  . Years of education: Not on file  . Highest education level: 5th grade  Occupational History  . Not on file  Tobacco Use  . Smoking status: Never Smoker  . Smokeless tobacco: Never Used  Vaping Use  . Vaping Use: Never used  Substance and Sexual Activity  . Alcohol use: No  . Drug use: No  . Sexual activity: Yes    Birth control/protection: Condom  Other Topics Concern  . Not on file  Social History Narrative  . Not on file   Social Determinants of Health   Financial Resource Strain:   . Difficulty of Paying Living Expenses: Not on file  Food Insecurity:   . Worried About Programme researcher, broadcasting/film/video in the Last Year: Not on file  . Ran Out of Food in the Last Year: Not on file  Transportation Needs: No Transportation Needs  . Lack of Transportation (Medical): No  . Lack of Transportation (Non-Medical): No  Physical Activity:   . Days of Exercise per Week: Not on file  . Minutes of Exercise per Session: Not on file  Stress:   . Feeling of Stress : Not on file  Social Connections:   . Frequency of  Communication with Friends and Family: Not on file  . Frequency of Social Gatherings with Friends and Family: Not on file  . Attends Religious Services: Not on file  . Active Member of Clubs or Organizations: Not on file  . Attends Banker Meetings: Not on file  . Marital Status: Not on file  Intimate Partner Violence:   . Fear of Current or Ex-Partner: Not on file  . Emotionally Abused: Not on file  . Physically Abused: Not on file  . Sexually Abused: Not on file    Outpatient Medications Prior to Visit  Medication Sig Dispense Refill  . atorvastatin (LIPITOR) 20 MG tablet Take 1 tablet (20 mg total) by mouth daily. 90 tablet 3  . glipiZIDE (GLUCOTROL) 10 MG tablet Take 1 tablet (10 mg total) by mouth 2 (two) times daily before a meal. 180 tablet 1  . hydrochlorothiazide (HYDRODIURIL) 25 MG tablet Take 1 tablet (25 mg total) by mouth daily. 90 tablet 1  . ibuprofen (ADVIL) 800 MG tablet Take 1 tablet (800 mg total) by mouth every 8 (eight) hours as needed for moderate pain. 90 tablet 1  . lisinopril (ZESTRIL) 20 MG tablet Take 1 tablet (20 mg total) by mouth daily. 90 tablet 1  . metFORMIN (GLUCOPHAGE) 500 MG  tablet TAKE 2 TABLETS (1,000 MG TOTAL) BY MOUTH 2 (TWO) TIMES DAILY WITH A MEAL. 90 tablet 1   No facility-administered medications prior to visit.    No Known Allergies  ROS Review of Systems  Neurological: Positive for numbness.       Tingling      Objective:     Vitals:   10/16/20 0929  BP: 133/80  Pulse: 84  Temp: (!) 97.3 F (36.3 C)  TempSrc: Temporal  SpO2: 100%  Weight: 170 lb 3.2 oz (77.2 kg)  Height: 4\' 11"  (1.499 m)   Physical Exam General: Vital signs reviewed.  Patient is well-developed and well-nourished, I obese female in no acute distress and cooperative with exam.  Head: Normocephalic and atraumatic. Eyes: EOMI, conjunctivae normal, no scleral icterus.  Neck: Supple, trachea midline, normal ROM, no JVD, masses, thyromegaly, or  carotid bruit present.  Cardiovascular: RRR, S1 normal, S2 normal, no murmurs, gallops, or rubs. Pulmonary/Chest: Clear to auscultation bilaterally, no wheezes, rales, or rhonchi. Abdominal: Soft, non-tender, non-distended, BS +, no masses, organomegaly, or guarding present.  Musculoskeletal: No joint deformities, erythema, or stiffness, ROM full and nontender. Extremities: No lower extremity edema bilaterally,  pulses symmetric and intact bilaterally. No cyanosis or clubbing. Neurological: A&O x3, Strength is normal and symmetric bilaterally, cranial nerve II-XII are grossly intact, no focal motor deficit, sensory intact to light touch bilaterally.  Skin: Warm, dry and intact. No rashes or erythema. Psychiatric: Normal mood and affect. speech and behavior is normal. Cognition and memory are normal.    Health Maintenance Due  Topic Date Due  . COVID-19 Vaccine (1) Never done  . INFLUENZA VACCINE  07/08/2020  . PAP SMEAR-Modifier  10/08/2020    There are no preventive care reminders to display for this patient.  No results found for: TSH Lab Results  Component Value Date   WBC 11.8 (H) 10/04/2019   HGB 13.0 10/04/2019   HCT 39.7 10/04/2019   MCV 84 10/04/2019   PLT 217 10/04/2019   Lab Results  Component Value Date   NA 134 10/04/2019   K 4.3 10/04/2019   CO2 23 10/04/2019   GLUCOSE 345 (H) 10/04/2019   BUN 7 10/04/2019   CREATININE 0.64 10/04/2019   BILITOT <0.2 10/04/2019   ALKPHOS 126 (H) 10/04/2019   AST 165 (H) 10/04/2019   ALT 160 (H) 10/04/2019   PROT 7.8 10/04/2019   ALBUMIN 4.4 10/04/2019   CALCIUM 9.2 10/04/2019   ANIONGAP 7 11/07/2015   Lab Results  Component Value Date   CHOL 206 (H) 10/04/2019   Lab Results  Component Value Date   HDL 32 (L) 10/04/2019   Lab Results  Component Value Date   LDLCALC 135 (H) 10/04/2019   Lab Results  Component Value Date   TRIG 215 (H) 10/04/2019   Lab Results  Component Value Date   CHOLHDL 6.4 (H) 10/04/2019    Lab Results  Component Value Date   HGBA1C 5.9 (A) 12/21/2019      Assessment & Plan:  Jeanita was seen today for hypertension.  Diagnoses and all orders for this visit:  Type 2 diabetes mellitus without complication, without long-term current use of insulin (HCC) -     HgB A1c 5.9 no change stable per ADA guidelines prediabetes is controlled . Reduce metformin to 500mg  twice a day   Essential hypertension Counseled on blood pressure goal of less than 130/80, low-sodium, DASHrrently on atorvastatin 20mg   At bed time   BMI 35.0-35.9,adult diet,  medication compliance, 150 minutes of moderate intensity exercise per week. Discussed medication compliance, adverse effects.  Mixed hyperlipidemia Lipids done today cu Obesity is 30-39 indicating an excess in caloric intake or underlining conditions. This may lead to other co-morbidities. HTN/DM/Respiratory problems Lifestyle modifications of diet and exercise may reduce obesity.    Follow-up: Return for Pap and Flu shot.    Grayce Sessions, NP

## 2020-10-17 ENCOUNTER — Telehealth (INDEPENDENT_AMBULATORY_CARE_PROVIDER_SITE_OTHER): Payer: Self-pay

## 2020-10-17 LAB — CMP14+EGFR
ALT: 17 IU/L (ref 0–32)
AST: 16 IU/L (ref 0–40)
Albumin/Globulin Ratio: 1.5 (ref 1.2–2.2)
Albumin: 4.4 g/dL (ref 3.8–4.8)
Alkaline Phosphatase: 90 IU/L (ref 44–121)
BUN/Creatinine Ratio: 15 (ref 9–23)
BUN: 11 mg/dL (ref 6–24)
Bilirubin Total: <0.2 mg/dL (ref 0.0–1.2)
CO2: 24 mmol/L (ref 20–29)
Calcium: 9.1 mg/dL (ref 8.7–10.2)
Chloride: 100 mmol/L (ref 96–106)
Creatinine, Ser: 0.73 mg/dL (ref 0.57–1.00)
GFR calc Af Amer: 115 mL/min/{1.73_m2} (ref >59)
GFR calc non Af Amer: 100 mL/min/{1.73_m2} (ref >59)
Globulin, Total: 3 g/dL (ref 1.5–4.5)
Glucose: 213 mg/dL — ABNORMAL HIGH (ref 65–99)
Potassium: 4.1 mmol/L (ref 3.5–5.2)
Sodium: 136 mmol/L (ref 134–144)
Total Protein: 7.4 g/dL (ref 6.0–8.5)

## 2020-10-17 LAB — CBC WITH DIFFERENTIAL/PLATELET
Basophils Absolute: 0.1 10*3/uL (ref 0.0–0.2)
Basos: 1 %
EOS (ABSOLUTE): 0.1 10*3/uL (ref 0.0–0.4)
Eos: 1 %
Hematocrit: 36.1 % (ref 34.0–46.6)
Hemoglobin: 11.4 g/dL (ref 11.1–15.9)
Immature Grans (Abs): 0 10*3/uL (ref 0.0–0.1)
Immature Granulocytes: 0 %
Lymphocytes Absolute: 3.1 10*3/uL (ref 0.7–3.1)
Lymphs: 32 %
MCH: 25.6 pg — ABNORMAL LOW (ref 26.6–33.0)
MCHC: 31.6 g/dL (ref 31.5–35.7)
MCV: 81 fL (ref 79–97)
Monocytes Absolute: 0.7 10*3/uL (ref 0.1–0.9)
Monocytes: 8 %
Neutrophils Absolute: 5.6 10*3/uL (ref 1.4–7.0)
Neutrophils: 58 %
Platelets: 202 10*3/uL (ref 150–450)
RBC: 4.45 x10E6/uL (ref 3.77–5.28)
RDW: 14.2 % (ref 11.7–15.4)
WBC: 9.6 10*3/uL (ref 3.4–10.8)

## 2020-10-17 LAB — LIPID PANEL
Chol/HDL Ratio: 3.9 ratio (ref 0.0–4.4)
Cholesterol, Total: 116 mg/dL (ref 100–199)
HDL: 30 mg/dL — ABNORMAL LOW (ref >39)
LDL Chol Calc (NIH): 64 mg/dL (ref 0–99)
Triglycerides: 123 mg/dL (ref 0–149)
VLDL Cholesterol Cal: 22 mg/dL (ref 5–40)

## 2020-10-17 NOTE — Telephone Encounter (Signed)
Call placed to patient using pacific interpreter [phone]) patient verified date of birth. She is aware that labs are normal. Maryjean Morn, CMA

## 2020-10-17 NOTE — Telephone Encounter (Signed)
-----   Message from Grayce Sessions, NP sent at 10/17/2020  9:48 AM EST ----- Labs are normal . Glucose elevated due to dx DM

## 2020-11-09 ENCOUNTER — Other Ambulatory Visit: Payer: Self-pay | Admitting: Obstetrics and Gynecology

## 2020-11-09 DIAGNOSIS — Z1231 Encounter for screening mammogram for malignant neoplasm of breast: Secondary | ICD-10-CM

## 2020-11-12 ENCOUNTER — Other Ambulatory Visit: Payer: Self-pay

## 2020-11-12 ENCOUNTER — Ambulatory Visit (INDEPENDENT_AMBULATORY_CARE_PROVIDER_SITE_OTHER): Payer: Self-pay | Admitting: Primary Care

## 2020-11-12 ENCOUNTER — Encounter (INDEPENDENT_AMBULATORY_CARE_PROVIDER_SITE_OTHER): Payer: Self-pay | Admitting: Primary Care

## 2020-11-12 ENCOUNTER — Other Ambulatory Visit (HOSPITAL_COMMUNITY)
Admission: RE | Admit: 2020-11-12 | Discharge: 2020-11-12 | Disposition: A | Discharge status: Home / Self Care | Payer: Self-pay | Source: Ambulatory Visit | Attending: Primary Care | Admitting: Primary Care

## 2020-11-12 VITALS — BP 133/74 | HR 88 | Temp 97.7°F | Ht 59.0 in | Wt 173.0 lb

## 2020-11-12 DIAGNOSIS — Z1231 Encounter for screening mammogram for malignant neoplasm of breast: Secondary | ICD-10-CM

## 2020-11-12 DIAGNOSIS — Z23 Encounter for immunization: Secondary | ICD-10-CM

## 2020-11-12 DIAGNOSIS — Z113 Encounter for screening for infections with a predominantly sexual mode of transmission: Secondary | ICD-10-CM

## 2020-11-12 DIAGNOSIS — Z124 Encounter for screening for malignant neoplasm of cervix: Secondary | ICD-10-CM

## 2020-11-12 NOTE — Patient Instructions (Addendum)
Gripe en los adultos Influenza, Adult A la gripe tambin se la conoce como "influenza". Es una infeccin en los pulmones, la nariz y la garganta (vas respiratorias). La causa un virus. La gripe provoca sntomas que son similares a los de un resfro. Tambin causa fiebre alta y dolores corporales. Se transmite fcilmente de persona a persona (es contagiosa). La mejor manera de prevenir la gripe es aplicndose la vacuna contra la gripe todos los aos. Cules son las causas? La causa de esta afeccin es el virus de la influenza. Puede contraer el virus de las siguientes maneras:  Respirar las gotitas que estn en el aire y que provienen de la tos o el estornudo de una persona que tiene el virus.  Tocar algo que tiene el virus (est contaminado) y luego tocarse la boca, la nariz o los ojos. Qu incrementa el riesgo? Hay ciertas cosas que lo pueden hacer ms propenso a tener gripe. Estas incluyen lo siguiente:  No lavarse las manos con frecuencia.  Tener contacto cercano con muchas personas durante la temporada de resfro y gripe.  Tocarse la boca, los ojos o la nariz sin antes lavarse las manos.  No recibir la vacuna antigripal todos los aos. Puede correr un mayor riesgo de tener gripe, junto con problemas graves como una infeccin pulmonar (neumona), si:  Es mayor de 65 aos de edad.  Est embarazada.  Tiene debilitado el sistema que combate las defensas (sistema inmunitario) debido a una enfermedad o porque toma determinados medicamentos.  Tiene una enfermedad prolongada (crnica), por ejemplo: ? Enfermedad cardaca, renal o pulmonar. ? Diabetes. ? Asma.  Tiene un trastorno heptico.  Tiene mucho sobrepeso (obesidad mrbida).  Tiene anemia. Esta es una afeccin que afecta a los glbulos rojos. Cules son los signos o los sntomas? Los sntomas normalmente comienzan de repente y duran entre 4 y 14 das. Pueden incluir los siguientes:  Fiebre y escalofros.  Dolores de  cabeza, dolores en el cuerpo o dolores musculares.  Dolor de garganta.  Tos.  Secrecin o congestin nasal.  Malestar en el pecho.  No desear comer en las cantidades normales (prdida del apetito).  Debilidad o cansancio (fatiga).  Mareos.  Malestar estomacal (nuseas) o ganas de devolver (vmitos). Cmo se trata? Si la gripe se encuentra de forma temprana, se la puede tratar con medicamentos que pueden ayudar a reducir la gravedad de la enfermedad y reducir su duracin (medicamentos antivirales). Estos pueden administrarse por boca (va oral) o por va (catter) intravenosa. Cuidarse en su hogar puede ayudar a que mejoren los sntomas. El mdico puede sugerirle lo siguiente:  Tomar medicamentos de venta libre.  Beber mucho lquido. La gripe suele desaparecer sola. Si tiene sntomas muy graves u otros problemas, puede recibir tratamiento en un hospital. Siga estas indicaciones en su casa:     Actividad  Descanse todo lo que sea necesario. Duerma lo suficiente.  Qudese en su casa y no concurra al trabajo o a la escuela, como se lo haya indicado el mdico. ? No salga de su casa hasta que no haya tenido fiebre por 24horas sin tomar medicamentos. ? Salga de su casa solo para ir al mdico. Comida y bebida  Tome una SRO (solucin de rehidratacin oral). Es una bebida que se vende en farmacias y tiendas.  Beba suficiente lquido para mantener el pis (la orina) de color amarillo plido.  En la medida en que pueda, beba lquidos claros en pequeas cantidades. Los lquidos transparentes son, por ejemplo: ? Agua. ? Trocitos   de hielo. ? Jugo de frutas con agua agregada (jugo de frutas diluido). ? Bebidas deportivas de bajas caloras.  En la medida en que pueda, consuma alimentos blandos y fciles de digerir en pequeas cantidades. Estos alimentos incluyen: ? Bananas. ? Pur de [employer]. ? Arroz. ? BJ's. ? Tostadas. ? Galletas.  No coma ni beba lo  siguiente: ? Lquidos con alto contenido de azcar o cafena. ? Alcohol. ? Alimentos condimentados o con alto contenido de [place] Indicaciones generales  [employer] de venta libre y los recetados solamente como se lo haya indicado el mdico.  Use un humidificador de aire fro para que el aire de su casa est ms hmedo. Esto puede facilitar la respiracin.  Al toser o estornudar, cbrase la boca y la [place].  Lvese las manos con agua y jabn frecuentemente, en especial despus de toser o Engineering geologist. Use desinfectante para manos con alcohol si no dispone de [place] y [place].  Concurra a todas las visitas de control como se lo haya indicado el mdico. Esto es importante. Cmo se evita?   Colquese la vacuna antigripal todos los [place]. Puede colocarse la vacuna contra la gripe a fines de verano, en otoo o en invierno. Pregntele al mdico cundo debe aplicarse la vacuna contra la gripe.  Evite el contacto con personas que estn enfermas durante el otoo y el invierno (la temporada de resfro y gripe). Comunquese con un mdico si:  Tiene sntomas nuevos.  Tiene los siguientes sntomas: ? Journalist, newspaper. ? Materia fecal lquida (diarrea). ? Fiebre.  La tos empeora.  Empieza a tener ms mucosidad.  Tiene Programme researcher, broadcasting/film/video.  Vomita. Solicite ayuda inmediatamente si:  Le falta el aire.  Tiene dificultad para respirar.  La piel o las uas se ponen de un color azulado.  Presenta dolor muy intenso o rigidez en el cuello.  Tiene dolor de cabeza repentino.  Le duele la cara o el odo de forma repentina.  No puede comer ni beber sin vomitar. Resumen  La gripe es una infeccin en los pulmones, la nariz y Administrator. La causa un virus.  Tome los medicamentos de venta libre y los recetados solamente como se lo haya indicado el mdico.  Aplicarse la vacuna contra la gripe todos los aos es la mejor manera de evitar contagiarse la gripe. Esta informacin no tiene  Theme park manager el consejo del mdico. Asegrese de hacerle al mdico cualquier pregunta que tenga. Document Revised: 07/07/2018 Document Reviewed: 07/07/2018 Elsevier Patient Education  2020 Elsevier Inc.  [place] de Papanicolaou Pap Test Por qu me debo realizar esta prueba? La prueba de Papanicolaou, tambin denominada citologa vaginal, es una prueba de cribado para Engineer, manufacturing signos de:  Cncer de la vagina, del cuello uterino y del tero. El cuello uterino es la parte baja del tero que se abre hacia la vagina.  Infeccin.  Cambios que podran ser un signo de que se est desarrollando un cncer (cambios precancerosos). Las mujeres deben realizarse esta prueba con regularidad. En general, debe hacerse una prueba de Papanicolaou cada 3 aos hasta alcanzar la menopausia o hasta los 65 aos. Las [employer] 30 y 60 aos de edad pueden elegir realizarse la prueba de Papanicolaou al mismo tiempo que la prueba del VPH (virus del papiloma humano) cada 5 aos (en lugar de cada 3 aos). El mdico puede recomendarle que se realice pruebas de Papanicolaou con ms o menos frecuencia en funcin de sus afecciones mdicas y [employer] de la  prueba de Papanicolaou anterior. Qu tipo de [place] se toma?  El mdico recolectar una muestra de clulas de la superficie del cuello uterino. Lo har utilizando un pequeo hisopo de algodn, una esptula de plstico o un cepillo. Esta muestra se recolecta durante un examen plvico, mientras usted est recostada boca arriba sobre la mesa de examen con los pies en los descansos para pies (estribos). En algunos casos, tambin pueden recolectarse fluidos (secreciones) del cuello uterino y la vagina. Cmo debo prepararme para esta prueba?  Tenga en cuenta en qu etapa del ciclo menstrual se encuentra. Es posible que se le pida que vuelva a Charity fundraiser la prueba si est Magazine features editor en que debe Futures trader.  Si el da en que debe realizarse la prueba  tiene una infeccin vaginal aparente, deber volver a Nurse, learning disability prueba.  Siga las indicaciones del mdico acerca de lo siguiente: ? Cambiar o suspender los medicamentos que toma habitualmente. Algunos medicamentos pueden [employer] de la prueba, como los digitlicos y Regulatory affairs officer. ? Evite las duchas vaginales o los baos de inmersin el da de la prueba o Medical laboratory scientific officer anterior. Informe al mdico acerca de lo siguiente:  Cualquier alergia que tenga.  Todos los Walt Disney, incluidos vitaminas, hierbas, gotas oftlmicas, cremas y [address] de [address].  Cualquier enfermedad de la sangre que tenga.  Cirugas a las que se someti.  Cualquier afeccin mdica que tenga.  Si est embarazada o podra estarlo. Cmo se informan los resultados? Los [employer] de la prueba se informarn como anormales o normales. Puede producirse un resultado positivo falso. Este tipo de resultado es incorrecto porque indica que una enfermedad est presente cuando en realidad no lo est. Puede producirse un resultado negativo falso. Este tipo de resultado es incorrecto porque indica que una enfermedad no est presente cuando en realidad lo est. Qu significan los [place]? Un resultado normal en la prueba significa que no tiene signos de cncer de la vagina, del cuello uterino o del tero. Un resultado anormal puede significar que tiene:  Cncer. Una prueba de Papanicolaou por s sola no es suficiente para Consulting civil engineer. En este caso, se le realizarn ms pruebas.  Cambios precancerosos en la vagina, cuello uterino o tero.  Inflamacin del cuello uterino.  Enfermedades de transmisin sexual (ETS).  Infecciones por hongos.  Infecciones por parsitos. Hable con su mdico sobre lo que significan sus [place]. Preguntas para hacerle al mdico Consulte a su mdico o pregunte en el departamento donde se realiza la prueba acerca de lo siguiente:  Cundo  estarn disponibles mis resultados?  Cmo obtendr mis resultados?  Cules son mis opciones de tratamiento?  Qu otras pruebas necesito?  Cules son los prximos pasos que debo seguir? Resumen  En general, las mujeres deben hacerse una prueba de Papanicolaou cada 3 aos Engineer, maintenance la menopausia o [employer] 65 aos de [place].  El mdico recolectar una muestra de clulas de la superficie del cuello uterino. Lo har utilizando un pequeo hisopo de algodn, una esptula de plstico o un cepillo.  En algunos casos, tambin pueden recolectarse fluidos (secreciones) del cuello uterino y la vagina. Esta informacin no tiene Theme park manager el consejo del mdico. Asegrese de hacerle al mdico cualquier pregunta que tenga. Document Revised: 11/03/2017 Document Reviewed: 11/03/2017 Elsevier Patient Education  2020 [employer].

## 2020-11-15 LAB — CERVICOVAGINAL ANCILLARY ONLY
Bacterial Vaginitis (gardnerella): POSITIVE — AB
Chlamydia: NEGATIVE
Comment: NEGATIVE
Comment: NEGATIVE
Comment: NEGATIVE
Comment: NORMAL
Neisseria Gonorrhea: NEGATIVE
Trichomonas: NEGATIVE

## 2020-11-16 LAB — CYTOLOGY - PAP
Chlamydia: NEGATIVE
Comment: NEGATIVE
Comment: NEGATIVE
Comment: NORMAL
Diagnosis: NEGATIVE
HSV1: NEGATIVE
HSV2: NEGATIVE
Neisseria Gonorrhea: NEGATIVE

## 2020-11-18 NOTE — Progress Notes (Signed)
Established Patient Office Visit  Subjective:  Patient ID: Susan [name], female    DOB: 06/09/1976  Age: 44 y.o. MRN: [id number]  CC:  Chief Complaint  Patient presents with   Gynecologic Exam    HPI Susan [name] presents for gyn exam. Voices no complaints or concerns   Past Medical History:  Diagnosis Date   Diabetes 1.5, managed as type 2 (HCC)    Hypertension     No past surgical history on file.  Family History  Problem Relation Age of Onset   Diabetes Mother    Diabetes Father    Hypertension Father     Social History   Socioeconomic History   Marital status: Single    Spouse name: Not on file   Number of children: Not on file   Years of education: Not on file   Highest education level: 5th grade  Occupational History   Not on file  Tobacco Use   Smoking status: Never Smoker   Smokeless tobacco: Never Used  Vaping Use   Vaping Use: Never used  Substance and Sexual Activity   Alcohol use: No   Drug use: No   Sexual activity: Yes    Birth control/protection: Condom  Other Topics Concern   Not on file  Social History Narrative   Not on file   Social Determinants of Health   Financial Resource Strain: Not on file  Food Insecurity: Not on file  Transportation Needs: Not on file  Physical Activity: Not on file  Stress: Not on file  Social Connections: Not on file  Intimate Partner Violence: Not on file    Outpatient Medications Prior to Visit  Medication Sig Dispense Refill   atorvastatin (LIPITOR) 20 MG tablet Take 1 tablet (20 mg total) by mouth daily. 90 tablet 3   glipiZIDE (GLUCOTROL) 10 MG tablet Take 1 tablet (10 mg total) by mouth 2 (two) times daily before a meal. 180 tablet 1   hydrochlorothiazide (HYDRODIURIL) 25 MG tablet Take 1 tablet (25 mg total) by mouth daily. 90 tablet 1   ibuprofen (ADVIL) 800 MG tablet Take 1 tablet (800 mg total) by mouth every 8 (eight) hours as needed for moderate  pain. 90 tablet 1   lisinopril (ZESTRIL) 20 MG tablet Take 1 tablet (20 mg total) by mouth daily. 90 tablet 1   metFORMIN (GLUCOPHAGE) 500 MG tablet Take 1 tablet (500 mg total) by mouth 2 (two) times daily with a meal. 90 tablet 1   No facility-administered medications prior to visit.    No Known Allergies  ROS Review of Systems  All other systems reviewed and are negative.     Objective:    Physical Exam CONSTITUTIONAL: Well-developed, well-nourished, obese  female in no acute distress.  HENT:  Normocephalic, atraumatic, External right and left ear normal. EYES: Conjunctivae and EOM are normal. Pupils are equal, round, and reactive to light. No scleral icterus.  NECK: Normal range of motion, supple, no masses.  Normal thyroid.  SKIN: Skin is warm and dry. No rash noted. Not diaphoretic. No erythema. No pallor. NEUROLGIC: Alert and oriented to person, place, and time. Normal reflexes, muscle tone coordination. No cranial nerve deficit noted. PSYCHIATRIC: Normal mood and affect. Normal behavior. Normal judgment and thought content. CARDIOVASCULAR: Normal heart rate noted, regular rhythm RESPIRATORY: Clear to auscultation bilaterally. Effort and breath sounds normal, no problems with respiration noted. BREASTS: Taught BE ABDOMEN: Soft, normal bowel sounds, no distention noted.  No tenderness, rebound  or guarding.  PELVIC: Normal appearing external genitalia; normal appearing vaginal mucosa and cervix.  No abnormal discharge noted.  Pap smear obtained.  Normal uterine size, no other palpable masses, no uterine or adnexal tenderness. MUSCULOSKELETAL: Normal range of motion. No tenderness.  No cyanosis, clubbing, or edema.  2+ distal pulses. BP 133/74 (BP Location: Right Arm, Patient Position: Sitting, Cuff Size: Large)    Pulse 88    Temp 97.7 F (36.5 C) (Temporal)    Ht 4\' 11"  (1.499 m)    Wt 173 lb (78.5 kg)    LMP 10/15/2020 (Exact Date)    SpO2 100%    BMI 34.94 kg/m  Wt  Readings from Last 3 Encounters:  11/12/20 173 lb (78.5 kg)  10/16/20 170 lb 3.2 oz (77.2 kg)  07/16/20 173 lb (78.5 kg)     Health Maintenance Due  Topic Date Due   COVID-19 Vaccine (1) Never done    There are no preventive care reminders to display for this patient.  No results found for: TSH Lab Results  Component Value Date   WBC 9.6 10/16/2020   HGB 11.4 10/16/2020   HCT 36.1 10/16/2020   MCV 81 10/16/2020   PLT 202 10/16/2020   Lab Results  Component Value Date   NA 136 10/16/2020   K 4.1 10/16/2020   CO2 24 10/16/2020   GLUCOSE 213 (H) 10/16/2020   BUN 11 10/16/2020   CREATININE 0.73 10/16/2020   BILITOT <0.2 10/16/2020   ALKPHOS 90 10/16/2020   AST 16 10/16/2020   ALT 17 10/16/2020   PROT 7.4 10/16/2020   ALBUMIN 4.4 10/16/2020   CALCIUM 9.1 10/16/2020   ANIONGAP 7 11/07/2015   Lab Results  Component Value Date   CHOL 116 10/16/2020   Lab Results  Component Value Date   HDL 30 (L) 10/16/2020   Lab Results  Component Value Date   LDLCALC 64 10/16/2020   Lab Results  Component Value Date   TRIG 123 10/16/2020   Lab Results  Component Value Date   CHOLHDL 3.9 10/16/2020   Lab Results  Component Value Date   HGBA1C 5.9 (A) 10/16/2020      Assessment & Plan:  Susan [name] was seen today for gynecologic exam.  Diagnoses and all orders for this visit:  Cervical cancer screening -     Cytology - PAP([place])  Screening for STD (sexually transmitted disease) -     Cervicovaginal ancillary only  Need for immunization against influenza -     Flu Vaccine QUAD 36+ mos IM  Encounter for screening mammogram for malignant neoplasm of breast Patient completed application for BCCP while in clinic and application has and faxed to [facility]. Patient aware that [facility] will contact her directly to schedule appointment.   Follow-up: Return in about 6 months (around 05/13/2021).    07/13/2021, NP

## 2020-11-19 ENCOUNTER — Telehealth (INDEPENDENT_AMBULATORY_CARE_PROVIDER_SITE_OTHER): Payer: Self-pay

## 2020-11-19 ENCOUNTER — Other Ambulatory Visit (INDEPENDENT_AMBULATORY_CARE_PROVIDER_SITE_OTHER): Payer: Self-pay | Admitting: Primary Care

## 2020-11-19 ENCOUNTER — Other Ambulatory Visit: Payer: Self-pay | Admitting: Primary Care

## 2020-11-19 DIAGNOSIS — N760 Acute vaginitis: Secondary | ICD-10-CM

## 2020-11-19 MED ORDER — METRONIDAZOLE 500 MG PO TABS: 500.0000 mg | ORAL_TABLET | Freq: Two times a day (BID) | ORAL | 0 refills | Status: DC

## 2020-11-19 NOTE — Telephone Encounter (Signed)
Patient called in with pacific interpreter [phone]) she was aware that medication had been sent but unsure why. Explained all results to patient. Pap normal except BV. Explained to patient what BV was. She verbalized understanding. Maryjean Morn, CMA

## 2020-11-28 ENCOUNTER — Encounter (HOSPITAL_COMMUNITY): Payer: Self-pay | Admitting: Emergency Medicine

## 2020-11-28 ENCOUNTER — Emergency Department (HOSPITAL_COMMUNITY)
Admission: EM | Admit: 2020-11-28 | Discharge: 2020-11-29 | Disposition: A | Discharge status: Home / Self Care | Payer: Self-pay | Attending: Emergency Medicine | Admitting: Emergency Medicine

## 2020-11-28 ENCOUNTER — Other Ambulatory Visit: Payer: Self-pay

## 2020-11-28 DIAGNOSIS — R1084 Generalized abdominal pain: Secondary | ICD-10-CM | POA: Insufficient documentation

## 2020-11-28 DIAGNOSIS — E119 Type 2 diabetes mellitus without complications: Secondary | ICD-10-CM | POA: Insufficient documentation

## 2020-11-28 DIAGNOSIS — Z7984 Long term (current) use of oral hypoglycemic drugs: Secondary | ICD-10-CM | POA: Insufficient documentation

## 2020-11-28 DIAGNOSIS — U071 COVID-19: Secondary | ICD-10-CM | POA: Insufficient documentation

## 2020-11-28 DIAGNOSIS — I10 Essential (primary) hypertension: Secondary | ICD-10-CM | POA: Insufficient documentation

## 2020-11-28 DIAGNOSIS — Z79899 Other long term (current) drug therapy: Secondary | ICD-10-CM | POA: Insufficient documentation

## 2020-11-28 LAB — CBC
HCT: 32.5 % — ABNORMAL LOW (ref 36.0–46.0)
Hemoglobin: 10.4 g/dL — ABNORMAL LOW (ref 12.0–15.0)
MCH: 24.4 pg — ABNORMAL LOW (ref 26.0–34.0)
MCHC: 32 g/dL (ref 30.0–36.0)
MCV: 76.1 fL — ABNORMAL LOW (ref 80.0–100.0)
Platelets: 177 10*3/uL (ref 150–400)
RBC: 4.27 MIL/uL (ref 3.87–5.11)
RDW: 14.6 % (ref 11.5–15.5)
WBC: 7.6 10*3/uL (ref 4.0–10.5)
nRBC: 0 % (ref 0.0–0.2)

## 2020-11-28 LAB — COMPREHENSIVE METABOLIC PANEL
ALT: 95 U/L — ABNORMAL HIGH (ref 0–44)
AST: 72 U/L — ABNORMAL HIGH (ref 15–41)
Albumin: 3.3 g/dL — ABNORMAL LOW (ref 3.5–5.0)
Alkaline Phosphatase: 76 U/L (ref 38–126)
Anion gap: 13 (ref 5–15)
BUN: 7 mg/dL (ref 6–20)
CO2: 24 mmol/L (ref 22–32)
Calcium: 8.1 mg/dL — ABNORMAL LOW (ref 8.9–10.3)
Chloride: 87 mmol/L — ABNORMAL LOW (ref 98–111)
Creatinine, Ser: 0.66 mg/dL (ref 0.44–1.00)
GFR, Estimated: >60 mL/min (ref >60)
Glucose, Bld: 219 mg/dL — ABNORMAL HIGH (ref 70–99)
Potassium: 3.1 mmol/L — ABNORMAL LOW (ref 3.5–5.1)
Sodium: 124 mmol/L — ABNORMAL LOW (ref 135–145)
Total Bilirubin: 0.5 mg/dL (ref 0.3–1.2)
Total Protein: 7.3 g/dL (ref 6.5–8.1)

## 2020-11-28 LAB — I-STAT BETA HCG BLOOD, ED (MC, WL, AP ONLY): I-stat hCG, quantitative: <5 m[IU]/mL (ref <5)

## 2020-11-28 LAB — URINALYSIS, ROUTINE W REFLEX MICROSCOPIC
Bilirubin Urine: NEGATIVE
Glucose, UA: NEGATIVE mg/dL
Hgb urine dipstick: NEGATIVE
Ketones, ur: 20 mg/dL — AB
Nitrite: NEGATIVE
Protein, ur: 100 mg/dL — AB
Specific Gravity, Urine: 1.033 — ABNORMAL HIGH (ref 1.005–1.030)
pH: 5 (ref 5.0–8.0)

## 2020-11-28 LAB — LIPASE, BLOOD: Lipase: 25 U/L (ref 11–51)

## 2020-11-28 LAB — RESP PANEL BY RT-PCR (FLU A&B, COVID) ARPGX2
Influenza A by PCR: NEGATIVE
Influenza B by PCR: NEGATIVE
SARS Coronavirus 2 by RT PCR: POSITIVE — AB

## 2020-11-28 NOTE — ED Notes (Signed)
Patient moved to appropriate waiting area 

## 2020-11-28 NOTE — ED Triage Notes (Signed)
Pt c/o fever, abdominal pain and vomiting x 8 days. Unvaccinated for covid, states her sister is +covid. Spanish interpreter used.

## 2020-11-29 MED ORDER — ALBUTEROL SULFATE HFA 108 (90 BASE) MCG/ACT IN AERS: 2.0000 | INHALATION_SPRAY | Freq: Once | RESPIRATORY_TRACT | Status: DC | PRN

## 2020-11-29 MED ORDER — ONDANSETRON HCL 4 MG/2ML IJ SOLN
4.0000 mg | Freq: Once | INTRAMUSCULAR | Status: AC
  Administered 2020-11-29: 01:00:00 4 mg via INTRAVENOUS
  Filled 2020-11-29: qty 2

## 2020-11-29 MED ORDER — DIPHENHYDRAMINE HCL 50 MG/ML IJ SOLN: 50.0000 mg | Freq: Once | INTRAMUSCULAR | Status: DC | PRN

## 2020-11-29 MED ORDER — EPINEPHRINE 0.3 MG/0.3ML IJ SOAJ: 0.3000 mg | Freq: Once | INTRAMUSCULAR | Status: DC | PRN

## 2020-11-29 MED ORDER — SODIUM CHLORIDE 0.9 % IV SOLN: INTRAVENOUS | Status: DC | PRN

## 2020-11-29 MED ORDER — SODIUM CHLORIDE 0.9 % IV SOLN
Freq: Once | INTRAVENOUS | Status: AC
  Filled 2020-11-29: qty 20

## 2020-11-29 MED ORDER — LACTATED RINGERS IV BOLUS
1000.0000 mL | Freq: Once | INTRAVENOUS | Status: AC
  Administered 2020-11-29: 01:00:00 1000 mL via INTRAVENOUS

## 2020-11-29 MED ORDER — SODIUM CHLORIDE 0.9 % IV SOLN: 1200.0000 mg | Freq: Once | INTRAVENOUS | Status: DC

## 2020-11-29 MED ORDER — FAMOTIDINE IN NACL 20-0.9 MG/50ML-% IV SOLN: 20.0000 mg | Freq: Once | INTRAVENOUS | Status: DC | PRN

## 2020-11-29 MED ORDER — METHYLPREDNISOLONE SODIUM SUCC 125 MG IJ SOLR: 125.0000 mg | Freq: Once | INTRAMUSCULAR | Status: DC | PRN

## 2020-11-29 NOTE — ED Notes (Addendum)
Dr. Clayborne Dana informed  pt has desated as low as 5 sustains it for a couple of minutes but quickly comes back to 90%-92% on room air.

## 2020-11-29 NOTE — ED Notes (Signed)
Patient verbalizes understanding of discharge instructions. Opportunity for questioning and answers were provided. Armband removed by staff, pt discharged from ED and ambulated to lobby to return home with family.  

## 2020-11-29 NOTE — ED Notes (Signed)
Patient sustaining 87-88% on RA while ambulating in room.

## 2020-11-29 NOTE — ED Provider Notes (Signed)
[facility] EMERGENCY DEPARTMENT Provider Note   CSN: [id number] Arrival date & time: 11/28/20  2010     History Chief Complaint  Patient presents with  . Abdominal Pain    Jonai Weyland is a 44 y.o. female.  Headache and fever. covid contact. Unvaccinated.   H/o DM, HTN, obesity.  A language interpreter was used.  Abdominal Pain Pain location:  Generalized Pain quality: not aching   Pain radiates to:  Does not radiate Pain severity:  Mild Onset quality:  Gradual Duration:  2 days Timing:  Constant Progression:  Worsening Chronicity:  New Context: recent illness   Context: not alcohol use and not eating   Relieved by:  None tried Worsened by:  Nothing Ineffective treatments:  None tried Associated symptoms: chills, cough, fever, nausea and vomiting   Associated symptoms: no anorexia        Past Medical History:  Diagnosis Date  . Diabetes 1.5, managed as type 2 (HCC)   . Hypertension     Patient Active Problem List   Diagnosis Date Noted  . Screening breast examination 11/10/2019    History reviewed. No pertinent surgical history.   OB History    Gravida  5   Para  4   Term  4   Preterm      AB  1   Living  4     SAB      IAB  1   Ectopic      Multiple      Live Births              Family History  Problem Relation Age of Onset  . Diabetes Mother   . Diabetes Father   . Hypertension Father     Social History   Tobacco Use  . Smoking status: Never Smoker  . Smokeless tobacco: Never Used  Vaping Use  . Vaping Use: Never used  Substance Use Topics  . Alcohol use: No  . Drug use: No    Home Medications Prior to Admission medications   Medication Sig Start Date End Date Taking? Authorizing Provider  atorvastatin (LIPITOR) 20 MG tablet Take 1 tablet (20 mg total) by mouth daily. 07/16/20   Grayce Sessions, NP  glipiZIDE (GLUCOTROL) 10 MG tablet Take 1 tablet (10 mg total) by mouth 2 (two)  times daily before a meal. 07/16/20   Grayce Sessions, NP  hydrochlorothiazide (HYDRODIURIL) 25 MG tablet Take 1 tablet (25 mg total) by mouth daily. 07/16/20   Grayce Sessions, NP  ibuprofen (ADVIL) 800 MG tablet Take 1 tablet (800 mg total) by mouth every 8 (eight) hours as needed for moderate pain. 02/17/20   Grayce Sessions, NP  lisinopril (ZESTRIL) 20 MG tablet Take 1 tablet (20 mg total) by mouth daily. 07/16/20   Grayce Sessions, NP  metFORMIN (GLUCOPHAGE) 500 MG tablet Take 1 tablet (500 mg total) by mouth 2 (two) times daily with a meal. 10/16/20   Grayce Sessions, NP  metroNIDAZOLE (FLAGYL) 500 MG tablet Take 1 tablet (500 mg total) by mouth 2 (two) times daily. 11/19/20   Grayce Sessions, NP    Allergies    Patient has no known allergies.  Review of Systems   Review of Systems  Constitutional: Positive for chills and fever.  Respiratory: Positive for cough.   Gastrointestinal: Positive for abdominal pain, nausea and vomiting. Negative for anorexia.  All other systems reviewed and are negative.  Physical Exam Updated Vital Signs BP 111/63   Pulse 79   Temp 100.3 F (37.9 C) (Oral)   Resp 20   Ht 4\' 11"  (1.499 m)   Wt 77.1 kg   SpO2 92%   BMI 34.34 kg/m   Physical Exam Vitals and nursing note reviewed.  Constitutional:      Appearance: She is well-developed and well-nourished.  HENT:     Head: Normocephalic and atraumatic.     Nose: Nose normal.     Mouth/Throat:     Mouth: Mucous membranes are dry.     Pharynx: Oropharynx is clear.  Eyes:     Pupils: Pupils are equal, round, and reactive to light.  Cardiovascular:     Rate and Rhythm: Normal rate and regular rhythm.  Pulmonary:     Effort: No respiratory distress.     Breath sounds: No stridor.  Abdominal:     General: There is no distension.  Musculoskeletal:        General: No swelling or tenderness. Normal range of motion.     Cervical back: Normal range of motion.  Skin:     General: Skin is warm and dry.  Neurological:     General: No focal deficit present.     Mental Status: She is alert.     ED Results / Procedures / Treatments   Labs (all labs ordered are listed, but only abnormal results are displayed) Labs Reviewed  RESP PANEL BY RT-PCR (FLU A&B, COVID) ARPGX2 - Abnormal; Notable for the following components:      Result Value   SARS Coronavirus 2 by RT PCR POSITIVE (*)    All other components within normal limits  COMPREHENSIVE METABOLIC PANEL - Abnormal; Notable for the following components:   Sodium 124 (*)    Potassium 3.1 (*)    Chloride 87 (*)    Glucose, Bld 219 (*)    Calcium 8.1 (*)    Albumin 3.3 (*)    AST 72 (*)    ALT 95 (*)    All other components within normal limits  CBC - Abnormal; Notable for the following components:   Hemoglobin 10.4 (*)    HCT 32.5 (*)    MCV 76.1 (*)    MCH 24.4 (*)    All other components within normal limits  URINALYSIS, ROUTINE W REFLEX MICROSCOPIC - Abnormal; Notable for the following components:   APPearance CLOUDY (*)    Specific Gravity, Urine 1.033 (*)    Ketones, ur 20 (*)    Protein, ur 100 (*)    Leukocytes,Ua SMALL (*)    Bacteria, UA FEW (*)    All other components within normal limits  URINE CULTURE  LIPASE, BLOOD  I-STAT BETA HCG BLOOD, ED ([facility], [facility], [facility] ONLY)    EKG None  Radiology No results found.  Procedures Procedures (including critical care time)  Medications Ordered in ED Medications  ondansetron (ZOFRAN) injection 4 mg (4 mg Intravenous Given 11/29/20 0128)  lactated ringers bolus 1,000 mL (0 mLs Intravenous Stopped 11/29/20 0414)  bamlanivimab 700 mg, etesevimab 1,400 mg in sodium chloride 0.9 % 160 mL IVPB ( Intravenous Stopped 11/29/20 0247)    ED Course  I have reviewed the triage vital signs and the nursing notes.  Pertinent labs & imaging results that were available during my care of the patient were reviewed by me and considered in my medical decision  making (see chart for details).    MDM Rules/Calculators/A&P  Symptoms likely related to covid illness.  MAB infusion LR zofran Reassess.  Had some episodes of hypoxia. Also O2 reportedly dropped to 88-89% while ambulating but did not become tachypneic or distressed. Plan will be discharge with strict return precautions.   Final Clinical Impression(s) / ED Diagnoses Final diagnoses:  COVID-19    Rx / DC Orders ED Discharge Orders    None       Lourdes Kucharski, Barbara Cower, MD 11/30/20 [phone]

## 2020-12-24 ENCOUNTER — Ambulatory Visit: Payer: Self-pay

## 2020-12-31 ENCOUNTER — Other Ambulatory Visit: Payer: Self-pay

## 2020-12-31 ENCOUNTER — Ambulatory Visit: Payer: Self-pay | Attending: Primary Care

## 2021-01-03 ENCOUNTER — Ambulatory Visit: Payer: Self-pay | Admitting: *Deleted

## 2021-01-03 ENCOUNTER — Other Ambulatory Visit: Payer: Self-pay

## 2021-01-03 ENCOUNTER — Ambulatory Visit
Admission: RE | Admit: 2021-01-03 | Discharge: 2021-01-03 | Disposition: A | Discharge status: Home / Self Care | Payer: No Typology Code available for payment source | Source: Ambulatory Visit | Attending: Obstetrics and Gynecology | Admitting: Obstetrics and Gynecology

## 2021-01-03 VITALS — BP 130/82 | Wt 168.5 lb

## 2021-01-03 DIAGNOSIS — Z1239 Encounter for other screening for malignant neoplasm of breast: Secondary | ICD-10-CM

## 2021-01-03 DIAGNOSIS — Z1231 Encounter for screening mammogram for malignant neoplasm of breast: Secondary | ICD-10-CM

## 2021-01-03 NOTE — Patient Instructions (Signed)
Explained breast self awareness with Francella Solian. Patient did not need a Pap smear today due to last Pap smear and HPV typing was 11/12/2020. Let her know BCCCP will cover Pap smears every 3 years unless has a history of abnormal Pap smears. Referred patient to the [facility] of [facility] for a screening mammogram on the mobile unit. Appointment scheduled Thursday, January 03, 2021 at 1330. Patient escorted to the mobile unit following BCCCP appointment for her screening mammogram. Let patient know the Breast Center will follow up with her within the next couple weeks with results of mammogram by letter or phone. Heywood Iles Soto verbalized understanding.  Bryndle Corredor, Kathaleen Maser, RN 12:57 PM

## 2021-01-03 NOTE — Progress Notes (Signed)
Ms. Devynn Hessler is a 45 y.o. female who presents to [facility] clinic today with no complaints.    Pap Smear: Pap smear not completed today. Last Pap smear was 11/12/2020 at [facility] and was normal. Per patient has no history of an abnormal Pap smear. Last Pap smear result is available in Epic.   Physical exam: Breasts Breasts symmetrical. No skin abnormalities bilateral breasts. No nipple retraction bilateral breasts. No nipple discharge bilateral breasts. No lymphadenopathy. No lumps palpated bilateral breasts. No complaints of pain or tenderness on exam.       Pelvic/Bimanual Pap is not indicated today per BCCCP guidelines.   Smoking History: Patient has never smoked.   Patient Navigation: Patient education provided. Access to services provided for patient through [facility] program. Spanish interpreter Natale Lay from [facility] provided.    Breast and Cervical Cancer Risk Assessment: Patient does not have family history of breast cancer, known genetic mutations, or radiation treatment to the chest before age 79. Patient does not have history of cervical dysplasia, immunocompromised, or DES exposure in-utero.  Risk Assessment    Risk Scores      01/03/2021 11/10/2019   Last edited by: Narda Rutherford, LPN Leighann Amadon, Carlye Grippe, RN   5-year risk: 0.4 % 0.4 %   Lifetime risk: 4.9 % 5 %          A: BCCCP exam without pap smear No complaints.  P: Referred patient to the [facility] for a screening mammogram on the mobile unit. Appointment scheduled Thursday, January 03, 2021 at 1330.  Priscille Heidelberg, RN 01/03/2021 12:56 PM

## 2021-01-09 ENCOUNTER — Other Ambulatory Visit (INDEPENDENT_AMBULATORY_CARE_PROVIDER_SITE_OTHER): Payer: Self-pay | Admitting: Primary Care

## 2021-01-09 ENCOUNTER — Other Ambulatory Visit: Payer: Self-pay | Admitting: Primary Care

## 2021-01-09 DIAGNOSIS — I10 Essential (primary) hypertension: Secondary | ICD-10-CM

## 2021-01-09 DIAGNOSIS — E119 Type 2 diabetes mellitus without complications: Secondary | ICD-10-CM

## 2021-02-12 ENCOUNTER — Other Ambulatory Visit (INDEPENDENT_AMBULATORY_CARE_PROVIDER_SITE_OTHER): Payer: Self-pay | Admitting: Primary Care

## 2021-02-12 ENCOUNTER — Other Ambulatory Visit: Payer: Self-pay | Admitting: Primary Care

## 2021-02-12 DIAGNOSIS — E119 Type 2 diabetes mellitus without complications: Secondary | ICD-10-CM

## 2021-02-12 DIAGNOSIS — I10 Essential (primary) hypertension: Secondary | ICD-10-CM

## 2021-02-12 NOTE — Telephone Encounter (Signed)
Requested Prescriptions  Pending Prescriptions Disp Refills  . lisinopril (ZESTRIL) 20 MG tablet [Pharmacy Med Name: LISINOPRIL 20 MG TABLET 20 Tablet] 90 tablet 0    Sig: TAKE 1 TABLET (20 MG TOTAL) BY MOUTH DAILY.     Cardiovascular:  ACE Inhibitors Failed - 02/12/2021  9:07 AM      Failed - K in normal range and within 180 days    Potassium  Date Value Ref Range Status  11/28/2020 3.1 (L) 3.5 - 5.1 mmol/L Final         Passed - Cr in normal range and within 180 days    Creatinine, Ser  Date Value Ref Range Status  11/28/2020 0.66 0.44 - 1.00 mg/dL Final         Passed - Patient is not pregnant      Passed - Last BP in normal range    BP Readings from Last 1 Encounters:  01/03/21 130/82         Passed - Valid encounter within last 6 months    Recent Outpatient Visits          3 months ago Cervical cancer screening   [facility] Gwinda Passe P, NP   3 months ago Type 2 diabetes mellitus without complication, without long-term current use of insulin (HCC)   [facility] Grayce Sessions, NP   7 months ago Essential hypertension   [facility] Grayce Sessions, NP   11 months ago Blood pressure check   [facility] Grayce Sessions, NP   12 months ago Blood pressure check   [facility] Grayce Sessions, NP      Future Appointments            In 2 months Randa Evens, Kinnie Scales, NP [facility]

## 2021-02-12 NOTE — Telephone Encounter (Signed)
Requested Prescriptions  Pending Prescriptions Disp Refills  . glipiZIDE (GLUCOTROL) 10 MG tablet [Pharmacy Med Name: glipiZIDE 10 MG TABS 10 Tablet] 180 tablet 0    Sig: TAKE 1 TABLET (10 MG TOTAL) BY MOUTH 2 (TWO) TIMES DAILY BEFORE A MEAL.     Endocrinology:  Diabetes - Sulfonylureas Passed - 02/12/2021  9:07 AM      Passed - HBA1C is between 0 and 7.9 and within 180 days    Hemoglobin A1C  Date Value Ref Range Status  10/16/2020 5.9 (A) 4.0 - 5.6 % Final         Passed - Valid encounter within last 6 months    Recent Outpatient Visits          3 months ago Cervical cancer screening   [facility] Gwinda Passe P, NP   3 months ago Type 2 diabetes mellitus without complication, without long-term current use of insulin (HCC)   [facility] Grayce Sessions, NP   7 months ago Essential hypertension   [facility] Grayce Sessions, NP   11 months ago Blood pressure check   [facility] Grayce Sessions, NP   12 months ago Blood pressure check   [facility] Grayce Sessions, NP      Future Appointments            In 2 months Randa Evens, Kinnie Scales, NP [facility]

## 2021-03-14 ENCOUNTER — Other Ambulatory Visit: Payer: Self-pay

## 2021-03-14 ENCOUNTER — Telehealth (INDEPENDENT_AMBULATORY_CARE_PROVIDER_SITE_OTHER): Payer: Self-pay | Admitting: Primary Care

## 2021-03-14 MED FILL — Glipizide Tab 10 MG: ORAL | 30 days supply | Qty: 60 | Fill #0 | Status: AC

## 2021-03-14 MED FILL — Lisinopril Tab 20 MG: ORAL | 30 days supply | Qty: 30 | Fill #0 | Status: AC

## 2021-03-14 MED FILL — Metformin HCl Tab 500 MG: ORAL | 30 days supply | Qty: 120 | Fill #0 | Status: AC

## 2021-03-14 MED FILL — Atorvastatin Calcium Tab 20 MG (Base Equivalent): ORAL | 30 days supply | Qty: 30 | Fill #0 | Status: AC

## 2021-03-14 NOTE — Telephone Encounter (Signed)
Requested medications are due for refill today.  yes  Requested medications are on the active medications list.  yes  Last refill. 01/09/2021  Future visit scheduled.   no  Notes to clinic.  Pt has not seen any provider.

## 2021-03-15 ENCOUNTER — Other Ambulatory Visit: Payer: Self-pay

## 2021-03-19 ENCOUNTER — Other Ambulatory Visit: Payer: Self-pay

## 2021-03-22 NOTE — Telephone Encounter (Signed)
Pt called and is requesting to have this refilled. Pt states that she is completely out of this medication. She is requesting to have this refilled asap. Please advise.

## 2021-03-25 NOTE — Telephone Encounter (Signed)
Sent to PCP ?

## 2021-03-26 ENCOUNTER — Other Ambulatory Visit (INDEPENDENT_AMBULATORY_CARE_PROVIDER_SITE_OTHER): Payer: Self-pay | Admitting: Primary Care

## 2021-03-26 DIAGNOSIS — I10 Essential (primary) hypertension: Secondary | ICD-10-CM

## 2021-03-26 MED ORDER — HYDROCHLOROTHIAZIDE 25 MG PO TABS
25.0000 mg | ORAL_TABLET | Freq: Every day | ORAL | 0 refills | Status: DC
  Filled 2021-03-26: qty 30, 30d supply, fill #0

## 2021-03-26 NOTE — Telephone Encounter (Signed)
Patient requesting HCTZ not diabetic medications.

## 2021-03-27 ENCOUNTER — Other Ambulatory Visit: Payer: Self-pay

## 2021-04-02 ENCOUNTER — Other Ambulatory Visit: Payer: Self-pay

## 2021-04-15 ENCOUNTER — Other Ambulatory Visit: Payer: Self-pay

## 2021-04-15 ENCOUNTER — Encounter (INDEPENDENT_AMBULATORY_CARE_PROVIDER_SITE_OTHER): Payer: Self-pay | Admitting: Primary Care

## 2021-04-15 ENCOUNTER — Ambulatory Visit (INDEPENDENT_AMBULATORY_CARE_PROVIDER_SITE_OTHER): Payer: Self-pay | Admitting: Primary Care

## 2021-04-15 VITALS — BP 121/78 | HR 79 | Temp 98.1°F | Ht 59.0 in | Wt 168.2 lb

## 2021-04-15 DIAGNOSIS — E782 Mixed hyperlipidemia: Secondary | ICD-10-CM

## 2021-04-15 DIAGNOSIS — I10 Essential (primary) hypertension: Secondary | ICD-10-CM

## 2021-04-15 DIAGNOSIS — Z760 Encounter for issue of repeat prescription: Secondary | ICD-10-CM

## 2021-04-15 DIAGNOSIS — E119 Type 2 diabetes mellitus without complications: Secondary | ICD-10-CM

## 2021-04-15 DIAGNOSIS — Z6833 Body mass index (BMI) 33.0-33.9, adult: Secondary | ICD-10-CM

## 2021-04-15 LAB — POCT GLYCOSYLATED HEMOGLOBIN (HGB A1C): Hemoglobin A1C: 6 % — AB (ref 4.0–5.6)

## 2021-04-15 MED ORDER — GLIPIZIDE 10 MG PO TABS
ORAL_TABLET | Freq: Two times a day (BID) | ORAL | 1 refills | Status: DC
  Filled 2021-04-15: qty 60, 30d supply, fill #0
  Filled 2021-05-17: qty 60, 30d supply, fill #1
  Filled 2021-06-12: qty 60, 30d supply, fill #2

## 2021-04-15 MED ORDER — LISINOPRIL 20 MG PO TABS
ORAL_TABLET | Freq: Every day | ORAL | 0 refills | Status: DC
  Filled 2021-04-15: qty 30, 30d supply, fill #0
  Filled 2021-05-17: qty 30, 30d supply, fill #1
  Filled 2021-06-12: qty 30, 30d supply, fill #2

## 2021-04-15 MED ORDER — IBUPROFEN 800 MG PO TABS
800.0000 mg | ORAL_TABLET | Freq: Three times a day (TID) | ORAL | 1 refills | Status: DC | PRN
  Filled 2021-04-15: qty 90, 30d supply, fill #0
  Filled 2021-05-17: qty 90, 30d supply, fill #1

## 2021-04-15 MED ORDER — HYDROCHLOROTHIAZIDE 25 MG PO TABS
25.0000 mg | ORAL_TABLET | Freq: Every day | ORAL | 1 refills | Status: DC
  Filled 2021-04-15: qty 90, 90d supply, fill #0

## 2021-04-15 MED ORDER — ATORVASTATIN CALCIUM 20 MG PO TABS
ORAL_TABLET | Freq: Every day | ORAL | 3 refills | Status: DC
  Filled 2021-04-15: qty 30, 30d supply, fill #0
  Filled 2021-05-17: qty 30, 30d supply, fill #1
  Filled 2021-06-12: qty 30, 30d supply, fill #2

## 2021-04-15 MED ORDER — METFORMIN HCL 500 MG PO TABS
500.0000 mg | ORAL_TABLET | Freq: Two times a day (BID) | ORAL | 1 refills | Status: DC
  Filled 2021-04-15: qty 60, 30d supply, fill #0
  Filled 2021-05-17: qty 60, 30d supply, fill #1
  Filled 2021-06-12: qty 60, 30d supply, fill #2

## 2021-04-15 NOTE — Patient Instructions (Signed)
Contagem de calorias para perda de peso Calorie Counting for [employer] Loss Calorias so unidades de energia. Seu corpo precisa de um certo nmero de calorias dos alimentos para continuar funcionando ao longo do dia. Quando voc come ou USG Corporation calorias do que o seu corpo precisa, ele armazena as calorias extras principalmente sob a forma de gordura. Quando voc come ou [employer] calorias do que o seu corpo precisa, ele queima gordura para obter a Recruitment consultant de que precisa. Contar calorias significa acompanhar o nmero de calorias que voc come ou bebe por dia. A contagem de calorias pode ser til Sara Lee voc precise Johnson [employer]. Se voc consumir menos calorias do que seu corpo precisa, dever perder peso. Pergunte ao seu mdico qual seria um peso saudvel para voc. Para que a contagem de calorias funcione, voc precisar ingerir todos os dias o nmero de calorias correto para perder uma quantidade saudvel de peso por semana. Um nutricionista pode descobrir quantas calorias voc precisa em um dia e sugerir formas de Visual merchandiser sua meta de calorias.  Uma quantidade saudvel de peso a ser perdido a cada semana  de 1-2 lb (0,5-0,9 kg). Isso em geral significa que sua ingesto calrica diria dever ser reduzida em 500-750 calorias.  Ingerir 1.200-1.500 calorias por dia pode ajudar a [employer] a perder The PNC Financial.  Ingerir 1.500-1.800 calorias por dia pode ajudar a [employer] a perder The PNC Financial. O que preciso saber sobre a contagem de calorias? Converse com seu mdico ou nutricionista para determinar quantas calorias voc deve consumir diariamente. Para atingir sua meta diria da calorias, voc precisar:  Descobrir quantas calorias h em cada alimento que voc gostaria de consumir. Tente fazer isso antes de comer.  Decida a quantidade do alimento que voc planeja comer.  Mantenha um dirio Corporate treasurer. Faa isso anotando o que voc comeu e quantas calorias o alimento tinha. Para ter sucesso  na perda de peso,  importante equilibrar a contagem de calorias com um estilo de vida saudvel que inclua atividades regulares. Onde posso encontrar informaes sobre calorias? O nmero de calorias em um alimento pode ser encontrado no rtulo das informaes nutricionais. Se o alimento no tiver um rtulo com as informaes nutricionais, tente procurar as calorias online ou pea ajuda ao seu nutricionista. Lembre-se que as calorias so informadas por poro. Se voc optar por ingerir mais de uma poro de um alimento, Control and instrumentation engineer as calorias por poro pelo nmero de pores que planeja comer. Por exemplo: o rtulo de uma embalagem de po diz que o tamanho de uma poro  1 fatia e que h 90 calorias em uma poro. Se voc comer 1 fatia, ter ingerido 90 calorias. Se voc comer 2 fatias, ter ingerido 180 calorias.   Como se faz um dirio alimentar? Aps cada vez que voc comer, anote as seguintes informaes em seu dirio alimentar o quanto antes:  O que voc comeu. Lembre-se de incluir coberturas, molhos e outros itens adicionados aos alimentos.  A quantidade que voc comeu. A quantidade pode ser [address] onas ou nmero de unidades.  Quantas calorias havia em cada alimento e bebida.  O nmero total de calorias da refeio que voc fez. Mantenha seu dirio alimentar por perto, como em um caderno de bolso, ou use um aplicativo de Aeronautical engineer ou site. Alguns programas calculam as calorias para voc e mostram quantas calorias restam para voc alcanar sua meta diria. Existem dicas para o controle das pores?  Saiba quantas calorias existem em  uma poro. Isso ajudar voc a saber quantas pores de determinado alimento pode ingerir.  Use uma xcara de medida para medir o Morgan Stanley. Voc pode tentar tambm pesar as pores em uma balana de cozinha. Com o tempo, voc saber estimar os tamanhos das pores de alguns alimentos.  Separe um tempo para colocar pores de  diferentes alimentos em seus pratos favoritos ou tigelas e xcaras favoritos para que voc visualize a quantidade de uma poro.  Tente no comer diretamente da embalagem de um alimento, como no pacote ou caixa. Comer diretamente da embalagem torna difcil ver o quanto voc est comendo e pode Administrator, sports. Coloque a quantidade que voc gostaria de comer em uma xcara ou em um prato para garantir que voc esteja comendo a poro correta.  Use pratos, copos e tigelas menores para pores menores e para Science writer.  Tente no realizar diversas tarefas ao mesmo tempo. Por exemplo, evite assistir TV ou mexer no computador enquanto come. Se estiver na hora de comer, sente-se  mesa e desfrute de sua refeio. Isso ajudar voc a Designer, television/film set saciado. Isso tambm ajudar voc a ter mais conscincia do quanto voc est comendo. Quais so as dicas para seguir esse plano? Leia o rtulo dos alimentos  Verifique o nmero de calorias em comparao com o Musician poro. O tamanho da poro pode ser Lennar Corporation do que o que voc est acostumado a Advertising copywriter.  Verifique a Unisys Corporation. Procure escolher alimentos ricos em protena, fibras e vitaminas e pobres em gordura saturada, gordura trans e sdio. No mercado  Leia os rtulos nutricionais ao fazer compras. Isso ajudar voc a tomar decises saudveis sobre quais alimentos comprar.  Preste ateno em alertas "baixo teor de gordura" ou "gordura zero" nas embalagens dos alimentos. Esses alimentos s vezes tm o mesmo nmero de calorias ou mais calorias do que as verses com gordura integral. Eles tambm muitas vezes contm acar, amido ou sal adicionados para compensar o sabor removido com a gordura.  Faa uma lista de mercado com alimentos com menos calorias e limite-se a ela. Na cozinha  Tente cozinhar seus alimentos preferidos de United Auto. Tente, por exemplo, assar em vez de fritar.  Use produtos  lcteos com pouca gordura. Planejamento das refeies  Use mais frutas, verduras e legumes. Metade do seu prato deve ser composto por frutas, verduras e legumes.  Inclua protenas magras, como frango, [place] e peixe. Estilo de vida Lyda Kalata, tente fazer [facility] [phone] a seguir:  150 minutos de exerccio moderado, como caminhada.  75 minutos de exerccio vigoroso, como corrida. Informaes gerais  Saiba quantas calorias existem nos alimentos que voc consome mais frequentemente. Isso ajudar a contar as Environmental consultant.  Encontre uma maneira de monitorar as calorias que funcione para voc. Use sua criatividade. Tente diferentes aplicativos ou programas, se anotar as calorias no funcionar no seu caso. Quais alimentos devo comer?  Coma alimentos nutritivos.  melhor consumir um alimento nutritivo rico em calorias, como um abacate, do que um alimento com poucos nutrientes, como um saco de batata frita.  Use suas calorias em alimentos e bebidas que o deixaro satisfeito e sem fome pouco tempo depois de comer. ? Exemplos de alimentos que causam saciedade so nozes e manteigas de nozes, verduras e legumes, protenas magras e alimentos com elevado teor de fibras, como gros integrais. Alimentos ricos em fibras so alimentos com mais de 5 g de fibra por poro.  Preste ateno s Newmont Miningcalorias das bebidas. Bebidas com baixo teor calrico incluem gua e bebidas no adoadas. Os itens listados acima podem no ser Consolidated Edisonuma lista completa dos alimentos e bebidas que voc pode consumir. Converse com um nutricionista para obter mais informaes.   Quais alimentos devo moderar? Limite alimentos ou bebidas que no sejam boas fontes de vitaminas, minerais ou protenas ou que sejam ricos em gorduras no saudveis. Isso inclui:  Balas.  Outros doces.  Refrigerantes, bebidas  base de caf, lcool e suco. Os itens listados acima podem no ser Consolidated Edisonuma lista completa dos alimentos e bebidas que voc  deve evitar. Converse com um nutricionista para obter mais informaes. Como contar as Writercalorias ao comer fora de casa?  Preste ateno UAL Corporationao tamanho dos pratos. Muitas vezes, as pores so muito OGE Energymaiores quando se come fora. Experimente essas dicas para manter as pores menores: ? Considere compartilhar uma refeio em vez de pedir uma s para voc. ? Se pedir um prato s para voc, coma apenas metade. Antes de comear a comer, pea um recipiente e coloque metade da sua refeio. ? Quando disponvel, considere pedir pores menores no cardpio Crown Holdingsem vez das pores inteiras.  Preste ateno s suas opes de comida e bebida. Saber a Best boymaneira pela qual os alimentos so preparados e o que est includo na refeio pode ajud-lo a consumir menos calorias. ? Se o menu informar as calorias, escolha as opes com menos calorias. ? Escolha pratos que incluam verduras, legumes, frutas, gros integrais, laticnios desnatados e protena magra. ? Escolha itens que sejam cozidos, grelhados, assados ou preparados no vapor. Evite itens que levam manteiga, massas, alimentos fritos ou servidos com molho  base de creme de leite. Itens crocantes geralmente so fritos, a menos que indicado o contrrio. ? Escolha gua, leite desnatado, ch gelado sem acar ou outras bebidas sem acar. Caso queira uma bebida alcolica, escolha a opo de baixa caloria, como uma taa de vinho ou uma cerveja baixa em calorias. ? Pea molhos, temperos e caldas separados. Eles em geral contm muitas calorias, e voc deve limitar a quantidade consumida. ? Se quiser uma salada, escolha uma salada verde e pea carnes grelhadas. Evite acrscimos como bacon, queijo ou frituras. Pea o molho separado ou pea que o molho seja feito com azeite de oliva e vinagre ou limo.  Faa uma estimativa de quantas pores de alimentos lhe sero servidas. O conhecimento dos Lockheed Martintamanhos das pores o ajudar a saber quanta comida est ingerindo em restaurantes. Onde  conseguir The ServiceMaster Companymais informaes  Centers for Micron TechnologyDisease Control and Prevention (Centros de Controle e Preveno de Doenas): FootballExhibition.com.brwww.cdc.gov  U.S. Department of Agriculture (Departamento de Aon Corporationgricultura dos EUA): WrestlingReporter.dkmyplate.gov Resumo  Contar calorias significa acompanhar o nmero de calorias que voc come ou bebe por dia. Se voc consumir menos calorias do que seu corpo precisa, dever perder peso.  A quantidade saudvel de peso a ser perdido por semana  de 1-2 lb (0,5-0,9 kg). Isso em geral significa reduzir sua ingesto calrica diria em 500-750 calorias.  O nmero de calorias em um alimento pode ser encontrado no rtulo das informaes nutricionais. Se o alimento no tiver um rtulo com as informaes nutricionais, tente procurar as calorias online ou pea ajuda ao seu nutricionista.  Use pratos, copos e tigelas menores para pores menores e para Science writerevitar comer em excesso.  Use suas calorias em alimentos e bebidas que o deixaro satisfeito e sem fome pouco depois de uma refeio. Estas informaes no se destinam a substituir as recomendaes de  seu mdico. No deixe de discutir quaisquer dvidas com seu mdico. Document Revised: 02/29/2020 Document Reviewed: 02/29/2020 Elsevier Patient Education  2021 [employer].

## 2021-04-15 NOTE — Progress Notes (Signed)
.me   Established Patient Office Visit  Subjective:  Patient ID: Susan [name], female    DOB: March 22, 1976  Age: 45 y.o. MRN: [id number]  CC:  Chief Complaint  Patient presents with  . Blood Pressure Check  . Medication Refill    HPI Ms. Kendalyn Cranfield is a 45 year old obese Hispanic female Gerald Stabs interpreter [phone]) presents for management of hypertension unremarkable blood pressure reading today is 121/78 and compliant with medications. She denies shortness of breath, headaches, chest pain or lower extremity edema.  Requesting medications refills.  A1c taken today also management of type 2 diabetes A1c 6.06 months ago 5.9 no significant difference.  She denies polyuria, polyphagia, polydipsia and vision changes.  She is monitoring changing the amounts of food that she is eating to not cause her to have increase in hunger/polyphagia Past Medical History:  Diagnosis Date  . Diabetes 1.5, managed as type 2 ([place])   . Hypertension     History reviewed. No pertinent surgical history.  Family History  Problem Relation Age of Onset  . Diabetes Mother   . Diabetes Father   . Hypertension Father   . Diabetes Sister   . Diabetes Sister     Social History   Socioeconomic History  . Marital status: Single    Spouse name: Not on file  . Number of children: 4  . Years of education: Not on file  . Highest education level: 5th grade  Occupational History  . Not on file  Tobacco Use  . Smoking status: Never Smoker  . Smokeless tobacco: Never Used  Vaping Use  . Vaping Use: Never used  Substance and Sexual Activity  . Alcohol use: No  . Drug use: No  . Sexual activity: Not Currently    Birth control/protection: Condom  Other Topics Concern  . Not on file  Social History Narrative  . Not on file   Social Determinants of Health   Financial Resource Strain: Not on file  Food Insecurity: Not on file  Transportation Needs: No Transportation Needs  . Lack of  Transportation (Medical): No  . Lack of Transportation (Non-Medical): No  Physical Activity: Not on file  Stress: Not on file  Social Connections: Not on file  Intimate Partner Violence: Not on file    Outpatient Medications Prior to Visit  Medication Sig Dispense Refill  . atorvastatin (LIPITOR) 20 MG tablet TAKE 1 TABLET (20 MG TOTAL) BY MOUTH DAILY. 90 tablet 3  . glipiZIDE (GLUCOTROL) 10 MG tablet TAKE 1 TABLET (10 MG TOTAL) BY MOUTH 2 (TWO) TIMES DAILY BEFORE A MEAL. 180 tablet 0  . glipiZIDE (GLUCOTROL) 10 MG tablet TAKE 1 TABLET (10 MG TOTAL) BY MOUTH 2 (TWO) TIMES DAILY BEFORE A MEAL. 180 tablet 1  . hydrochlorothiazide (HYDRODIURIL) 25 MG tablet Take 1 tablet (25 mg total) by mouth daily. 30 tablet 0  . ibuprofen (ADVIL) 800 MG tablet Take 1 tablet (800 mg total) by mouth every 8 (eight) hours as needed for moderate pain. 90 tablet 1  . lisinopril (ZESTRIL) 20 MG tablet TAKE 1 TABLET (20 MG TOTAL) BY MOUTH DAILY. 90 tablet 0  . metFORMIN (GLUCOPHAGE) 500 MG tablet TAKE 1 TABLET (500 MG TOTAL) BY MOUTH 2 (TWO) TIMES DAILY WITH A MEAL. 90 tablet 1  . metFORMIN (GLUCOPHAGE) 500 MG tablet TAKE 1 TABLET (500 MG TOTAL) BY MOUTH 2 (TWO) TIMES DAILY WITH A MEAL. 90 tablet 1  . atorvastatin (LIPITOR) 20 MG tablet Take 1 tablet (  20 mg total) by mouth daily. 90 tablet 3  . glipiZIDE (GLUCOTROL) 10 MG tablet TAKE 1 TABLET (10 MG TOTAL) BY MOUTH 2 (TWO) TIMES DAILY BEFORE A MEAL. 180 tablet 0  . lisinopril (ZESTRIL) 20 MG tablet TAKE 1 TABLET (20 MG TOTAL) BY MOUTH DAILY. 90 tablet 0  . lisinopril (ZESTRIL) 20 MG tablet TAKE 1 TABLET (20 MG TOTAL) BY MOUTH DAILY. 90 tablet 1  . metFORMIN (GLUCOPHAGE) 500 MG tablet TAKE 1 TABLET (500 MG TOTAL) BY MOUTH 2 (TWO) TIMES DAILY WITH A MEAL. 90 tablet 1  . metroNIDAZOLE (FLAGYL) 500 MG tablet Take 1 tablet (500 mg total) by mouth 2 (two) times daily. 14 tablet 0  . metroNIDAZOLE (FLAGYL) 500 MG tablet TAKE 1 TABLET (500 MG TOTAL) BY MOUTH 2 (TWO) TIMES  DAILY. 14 tablet 0   No facility-administered medications prior to visit.    No Known Allergies  ROS Review of Systems  All other systems reviewed and are negative.     Objective:    Physical Exam Vitals reviewed.  Constitutional:      Appearance: She is obese.  HENT:     Right Ear: Tympanic membrane and external ear normal.     Left Ear: Tympanic membrane and external ear normal.     Nose: Nose normal.  Eyes:     Extraocular Movements: Extraocular movements intact.     Pupils: Pupils are equal, round, and reactive to light.  Cardiovascular:     Rate and Rhythm: Normal rate and regular rhythm.  Pulmonary:     Effort: Pulmonary effort is normal.     Breath sounds: Normal breath sounds.  Abdominal:     General: Bowel sounds are normal. There is distension.     Palpations: Abdomen is soft.  Musculoskeletal:        General: Normal range of motion.     Cervical back: Normal range of motion and neck supple.  Skin:    General: Skin is warm and dry.  Neurological:     Mental Status: She is alert and oriented to person, place, and time.  Psychiatric:        Mood and Affect: Mood normal.        Behavior: Behavior normal.        Thought Content: Thought content normal.        Judgment: Judgment normal.     BP 121/78 (BP Location: Right Arm, Patient Position: Sitting, Cuff Size: Large)   Pulse 79   Temp 98.1 F (36.7 C) (Temporal)   Ht $R'4\' 11"'au$  (1.499 m)   Wt 168 lb 3.2 oz (76.3 kg)   LMP 04/09/2021 (Approximate)   SpO2 100%   BMI 33.97 kg/m  Wt Readings from Last 3 Encounters:  04/15/21 168 lb 3.2 oz (76.3 kg)  01/03/21 168 lb 8 oz (76.4 kg)  11/29/20 170 lb (77.1 kg)     Health Maintenance Due  Topic Date Due  . COVID-19 Vaccine (1) Never done  . COLONOSCOPY (Pts 45-43yrs Insurance coverage will need to be confirmed)  Never done    There are no preventive care reminders to display for this patient.  No results found for: TSH Lab Results  Component Value  Date   WBC 7.6 11/28/2020   HGB 10.4 (L) 11/28/2020   HCT 32.5 (L) 11/28/2020   MCV 76.1 (L) 11/28/2020   PLT 177 11/28/2020   Lab Results  Component Value Date   NA 124 (L) 11/28/2020   K 3.1 (  L) 11/28/2020   CO2 24 11/28/2020   GLUCOSE 219 (H) 11/28/2020   BUN 7 11/28/2020   CREATININE 0.66 11/28/2020   BILITOT 0.5 11/28/2020   ALKPHOS 76 11/28/2020   AST 72 (H) 11/28/2020   ALT 95 (H) 11/28/2020   PROT 7.3 11/28/2020   ALBUMIN 3.3 (L) 11/28/2020   CALCIUM 8.1 (L) 11/28/2020   ANIONGAP 13 11/28/2020   Lab Results  Component Value Date   CHOL 116 10/16/2020   Lab Results  Component Value Date   HDL 30 (L) 10/16/2020   Lab Results  Component Value Date   LDLCALC 64 10/16/2020   Lab Results  Component Value Date   TRIG 123 10/16/2020   Lab Results  Component Value Date   CHOLHDL 3.9 10/16/2020   Lab Results  Component Value Date   HGBA1C 6.0 (A) 04/15/2021      Assessment & Plan:  Shakyra was seen today for blood pressure check and medication refill.  Diagnoses and all orders for this visit:  Type 2 diabetes mellitus without complication, without long-term current use of insulin (HCC) Well controlled ADA reach therapeutic goals for glycemic control related to A1c measurements: Goal of therapy: Less than 6.5 hemoglobin A1c. Continue monitoring  foods that are high in carbohydrates are the following rice, potatoes, breads, sugars, and pastas.  Reduction in the intake (eating) will assist in lowering your blood sugars. -     HgB A1c 6.0 -     metFORMIN (GLUCOPHAGE) 500 MG tablet; Take 1 tablet (500 mg total) by mouth 2 (two) times daily with a meal. -     CBC with Differential/Platelet; Future  Essential hypertension Counseled on blood pressure goal of less than 130/80, low-sodium, DASH diet, medication compliance, 150 minutes of moderate intensity exercise per week. Discussed medication compliance, adverse effects. -     lisinopril (ZESTRIL) 20 MG  tablet; TAKE 1 TABLET (20 MG TOTAL) BY MOUTH DAILY. -     hydrochlorothiazide (HYDRODIURIL) 25 MG tablet; Take 1 tablet (25 mg total) by mouth daily. -     CMP14+EGFR; Future  BMI 33.0-33.9,adult Obesity is 30-39 indicating an excess in caloric intake or underlining conditions. This may lead to other co-morbidities. Lifestyle modifications of diet and exercise may reduce obesity.   Medication refill -     atorvastatin (LIPITOR) 20 MG tablet; TAKE 1 TABLET (20 MG TOTAL) BY MOUTH DAILY. -     lisinopril (ZESTRIL) 20 MG tablet; TAKE 1 TABLET (20 MG TOTAL) BY MOUTH DAILY. -     ibuprofen (ADVIL) 800 MG tablet; Take 1 tablet (800 mg total) by mouth every 8 (eight) hours as needed for moderate pain. -     metFORMIN (GLUCOPHAGE) 500 MG tablet; Take 1 tablet (500 mg total) by mouth 2 (two) times daily with a meal. -     hydrochlorothiazide (HYDRODIURIL) 25 MG tablet; Take 1 tablet (25 mg total) by mouth daily. -     glipiZIDE (GLUCOTROL) 10 MG tablet; TAKE 1 TABLET (10 MG TOTAL) BY MOUTH 2 (TWO) TIMES DAILY BEFORE A MEAL.  Mixed hyperlipidemia -     atorvastatin (LIPITOR) 20 MG tablet; TAKE 1 TABLET (20 MG TOTAL) BY MOUTH DAILY. -     Lipid panel; Future    Meds ordered this encounter  Medications  . atorvastatin (LIPITOR) 20 MG tablet    Sig: TAKE 1 TABLET (20 MG TOTAL) BY MOUTH DAILY.    Dispense:  90 tablet    Refill:  3  .  lisinopril (ZESTRIL) 20 MG tablet    Sig: TAKE 1 TABLET (20 MG TOTAL) BY MOUTH DAILY.    Dispense:  90 tablet    Refill:  0  . ibuprofen (ADVIL) 800 MG tablet    Sig: Take 1 tablet (800 mg total) by mouth every 8 (eight) hours as needed for moderate pain.    Dispense:  90 tablet    Refill:  1  . metFORMIN (GLUCOPHAGE) 500 MG tablet    Sig: Take 1 tablet (500 mg total) by mouth 2 (two) times daily with a meal.    Dispense:  90 tablet    Refill:  1  . hydrochlorothiazide (HYDRODIURIL) 25 MG tablet    Sig: Take 1 tablet (25 mg total) by mouth daily.    Dispense:   90 tablet    Refill:  1  . glipiZIDE (GLUCOTROL) 10 MG tablet    Sig: TAKE 1 TABLET (10 MG TOTAL) BY MOUTH 2 (TWO) TIMES DAILY BEFORE A MEAL.    Dispense:  180 tablet    Refill:  1    Follow-up: Return in about 6 months (around 10/16/2021) for BP/DM.    Kerin Perna, NP

## 2021-04-16 ENCOUNTER — Other Ambulatory Visit: Payer: Self-pay

## 2021-04-17 ENCOUNTER — Other Ambulatory Visit: Payer: Self-pay

## 2021-05-17 ENCOUNTER — Other Ambulatory Visit: Payer: Self-pay

## 2021-06-12 ENCOUNTER — Other Ambulatory Visit: Payer: Self-pay

## 2021-06-13 ENCOUNTER — Other Ambulatory Visit: Payer: Self-pay

## 2021-07-01 ENCOUNTER — Ambulatory Visit: Payer: No Typology Code available for payment source | Attending: Primary Care

## 2021-07-01 ENCOUNTER — Other Ambulatory Visit: Payer: Self-pay

## 2021-07-12 ENCOUNTER — Other Ambulatory Visit (INDEPENDENT_AMBULATORY_CARE_PROVIDER_SITE_OTHER): Payer: Self-pay | Admitting: Primary Care

## 2021-07-12 ENCOUNTER — Other Ambulatory Visit: Payer: Self-pay

## 2021-07-12 DIAGNOSIS — E119 Type 2 diabetes mellitus without complications: Secondary | ICD-10-CM

## 2021-07-12 DIAGNOSIS — I10 Essential (primary) hypertension: Secondary | ICD-10-CM

## 2021-07-12 DIAGNOSIS — E782 Mixed hyperlipidemia: Secondary | ICD-10-CM

## 2021-07-12 DIAGNOSIS — Z760 Encounter for issue of repeat prescription: Secondary | ICD-10-CM

## 2021-07-12 MED ORDER — LISINOPRIL 20 MG PO TABS
ORAL_TABLET | Freq: Every day | ORAL | 0 refills | Status: DC
  Filled 2021-07-12: qty 30, 30d supply, fill #0
  Filled 2021-08-14: qty 30, 30d supply, fill #1
  Filled 2021-09-13: qty 30, 30d supply, fill #2

## 2021-07-12 MED ORDER — GLIPIZIDE 10 MG PO TABS
ORAL_TABLET | Freq: Two times a day (BID) | ORAL | 1 refills | Status: DC
Start: 2021-07-12 — End: 2021-10-21
  Filled 2021-07-12: qty 60, 30d supply, fill #0
  Filled 2021-08-14: qty 60, 30d supply, fill #1
  Filled 2021-09-13: qty 60, 30d supply, fill #2
  Filled 2021-10-17: qty 60, 30d supply, fill #3

## 2021-07-12 MED ORDER — METFORMIN HCL 500 MG PO TABS
500.0000 mg | ORAL_TABLET | Freq: Two times a day (BID) | ORAL | 1 refills | Status: DC
  Filled 2021-07-12: qty 60, 30d supply, fill #0
  Filled 2021-08-14: qty 60, 30d supply, fill #1
  Filled 2021-09-13: qty 60, 30d supply, fill #2

## 2021-07-12 MED ORDER — IBUPROFEN 800 MG PO TABS
800.0000 mg | ORAL_TABLET | Freq: Three times a day (TID) | ORAL | 1 refills | Status: DC | PRN
  Filled 2021-07-12: qty 90, 30d supply, fill #0
  Filled 2021-09-13: qty 90, 30d supply, fill #1

## 2021-07-12 MED ORDER — ATORVASTATIN CALCIUM 20 MG PO TABS
ORAL_TABLET | Freq: Every day | ORAL | 1 refills | Status: DC
  Filled 2021-07-12: qty 30, 30d supply, fill #0
  Filled 2021-08-14: qty 30, 30d supply, fill #1
  Filled 2021-09-13: qty 30, 30d supply, fill #2
  Filled 2021-10-17: qty 30, 30d supply, fill #3

## 2021-07-12 MED ORDER — HYDROCHLOROTHIAZIDE 25 MG PO TABS
25.0000 mg | ORAL_TABLET | Freq: Every day | ORAL | 1 refills | Status: DC
  Filled 2021-07-12: qty 30, 30d supply, fill #0
  Filled 2021-08-14: qty 30, 30d supply, fill #1
  Filled 2021-09-13: qty 30, 30d supply, fill #2
  Filled 2021-10-17: qty 30, 30d supply, fill #3

## 2021-07-12 NOTE — Telephone Encounter (Signed)
Copied from CRM [id number]. Topic: Quick Communication - Rx Refill/Question >> Jul 12, 2021  8:59 AM Gaetana Michaelis A wrote: Medication: Rx #: [id number]  atorvastatin (LIPITOR) 20 MG tablet  Rx #: [id number]  lisinopril (ZESTRIL) 20 MG tablet   Rx #: [id number]  ibuprofen (ADVIL) 800 MG tablet   Rx #: [id number]  metFORMIN (GLUCOPHAGE) 500 MG tablet   Rx #: [id number]  hydrochlorothiazide (HYDRODIURIL) 25 MG tablet   Rx #: [id number]  glipiZIDE (GLUCOTROL) 10 MG tablet    Has the patient contacted their pharmacy? Yes.   (Agent: If no, request that the patient contact the pharmacy for the refill.) (Agent: If yes, when and what did the pharmacy advise?)  Preferred Pharmacy (with phone number or street name): [facility]  Phone:  [phone] Fax:  [phone]  Agent: Please be advised that RX refills may take up to 3 business days. We ask that you follow-up with your pharmacy.

## 2021-07-17 ENCOUNTER — Other Ambulatory Visit: Payer: Self-pay

## 2021-08-14 ENCOUNTER — Other Ambulatory Visit: Payer: Self-pay

## 2021-08-16 ENCOUNTER — Other Ambulatory Visit: Payer: Self-pay

## 2021-09-13 ENCOUNTER — Other Ambulatory Visit: Payer: Self-pay

## 2021-09-16 ENCOUNTER — Other Ambulatory Visit: Payer: Self-pay

## 2021-09-23 ENCOUNTER — Telehealth: Payer: Self-pay | Admitting: Primary Care

## 2021-09-23 NOTE — Telephone Encounter (Signed)
Copied from CRM [id number]. Topic: Appointment Scheduling - Scheduling Inquiry for Clinic >> Sep 16, 2021 12:00 PM Marylen Ponto wrote: Reason for CRM: Pt would like to schedule appt for financial counseling to get the orange card. Cb# [phone]

## 2021-09-24 NOTE — Telephone Encounter (Signed)
I return Pt call, LVM to call me back 

## 2021-10-02 ENCOUNTER — Other Ambulatory Visit: Payer: Self-pay

## 2021-10-02 ENCOUNTER — Ambulatory Visit: Payer: Self-pay | Attending: Primary Care

## 2021-10-12 IMAGING — MG DIGITAL SCREENING BILAT W/ TOMO W/ CAD
6 of 10 series · 6 of 30 positions shown · non-contrast
Comparison: Previous exam(s).

CLINICAL DATA: Screening.

EXAM:
DIGITAL SCREENING BILATERAL MAMMOGRAM WITH TOMO AND CAD

[L CC synth-2D]
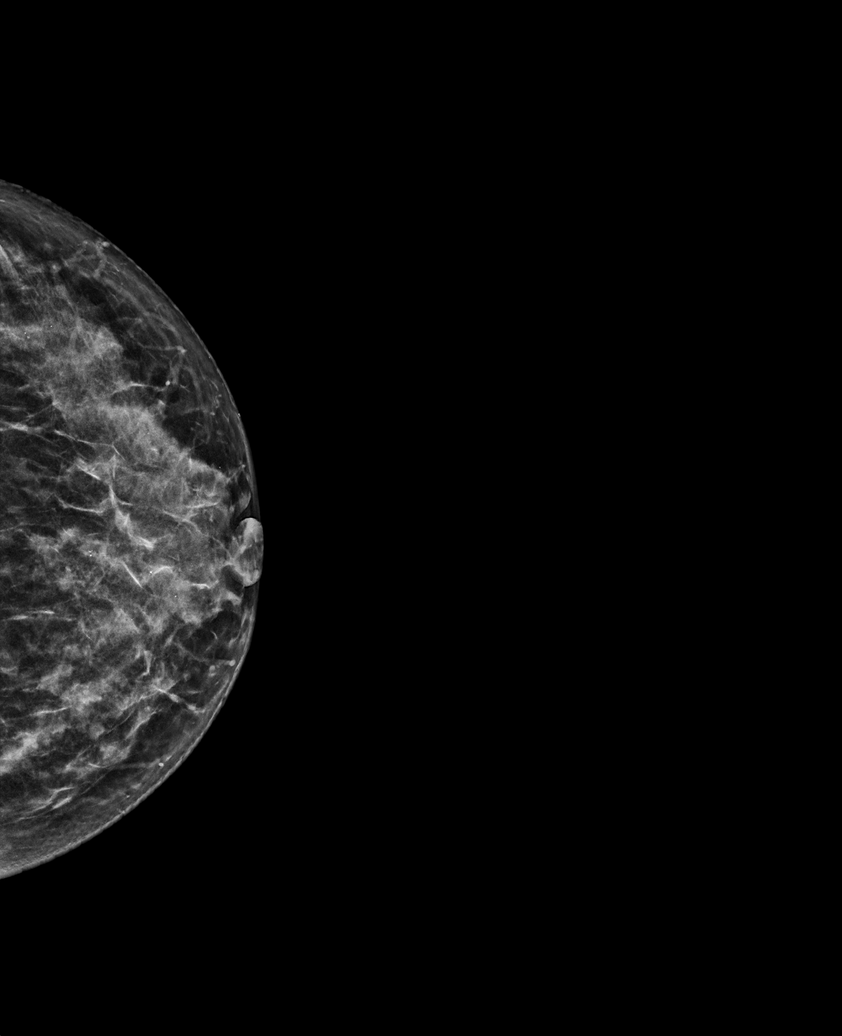

[L MLO synth-2D (1 of 2)]
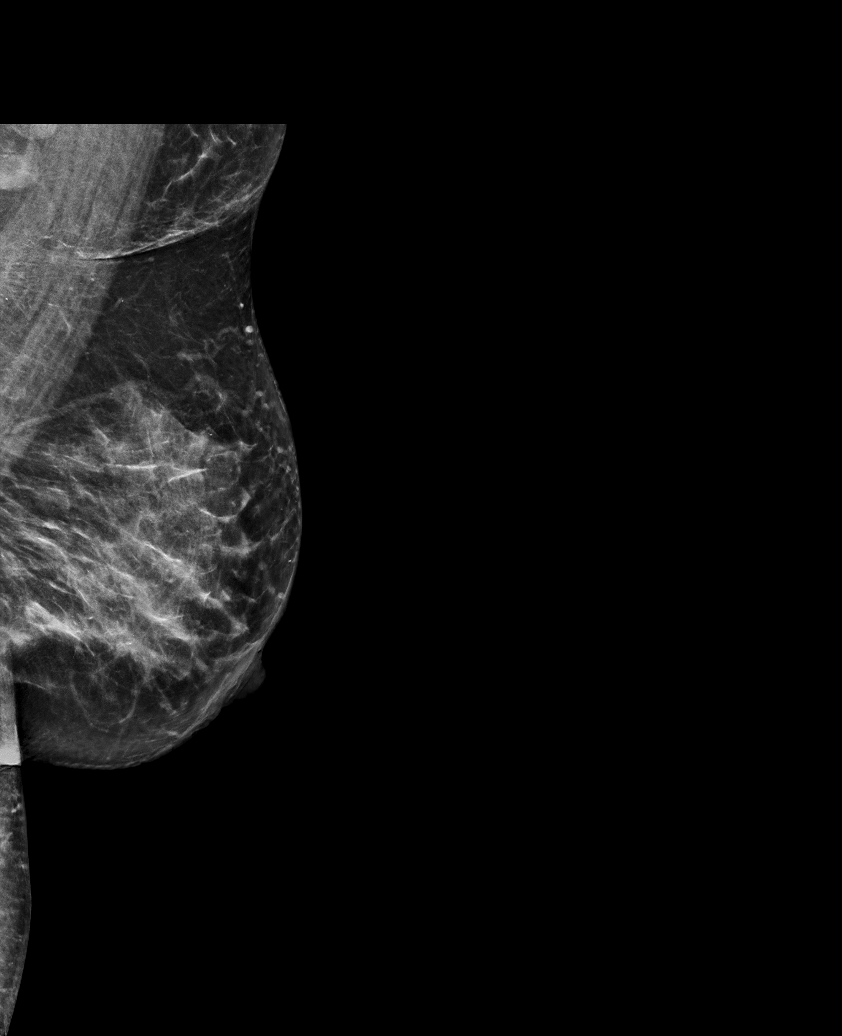

[L MLO synth-2D (2 of 2)]
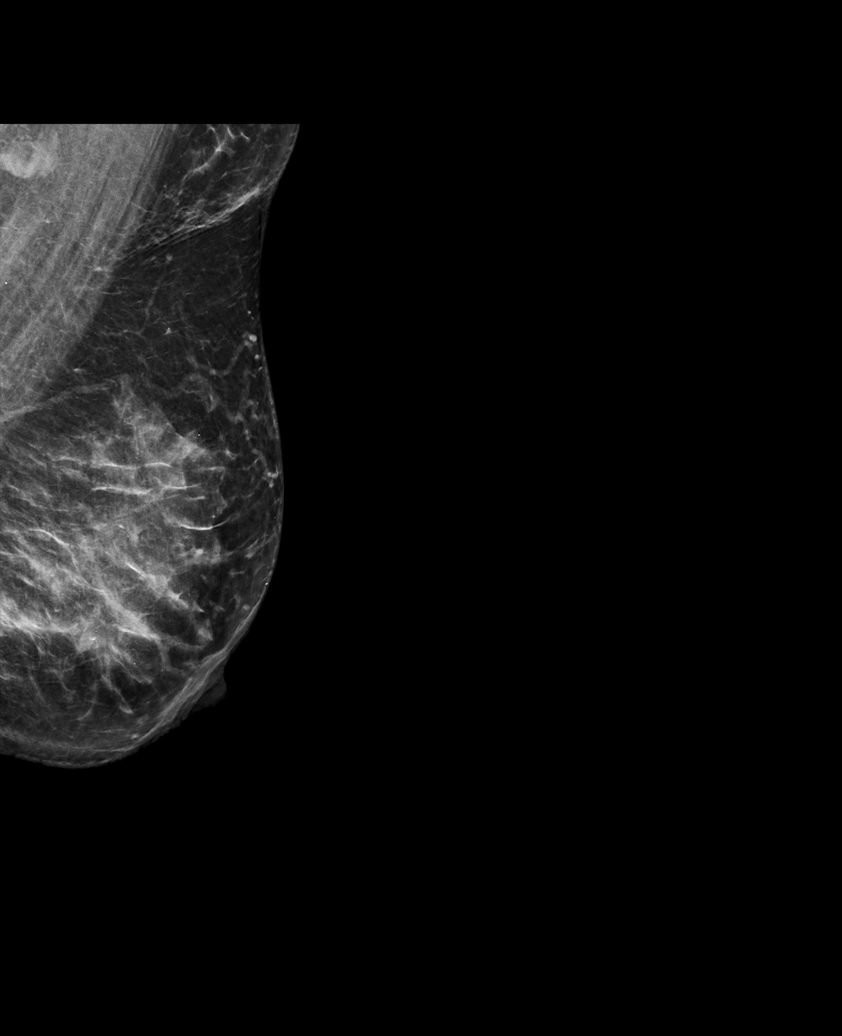

[R MLO synth-2D]
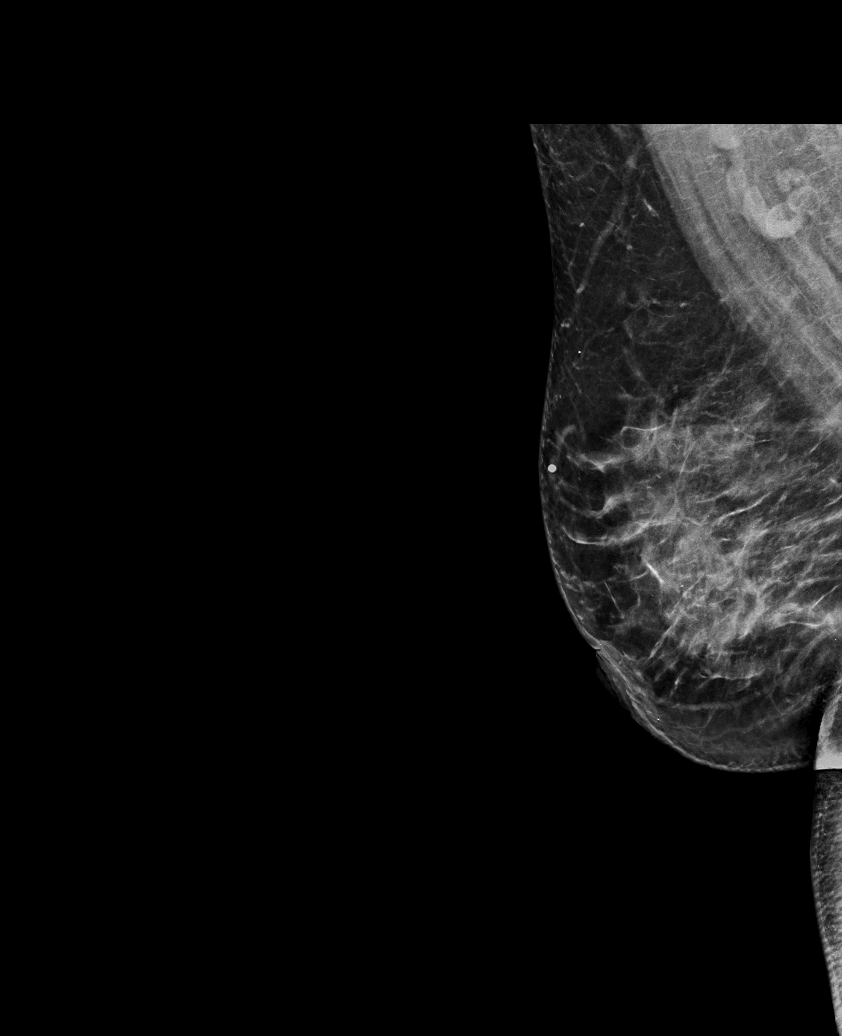

[R CC synth-2D]
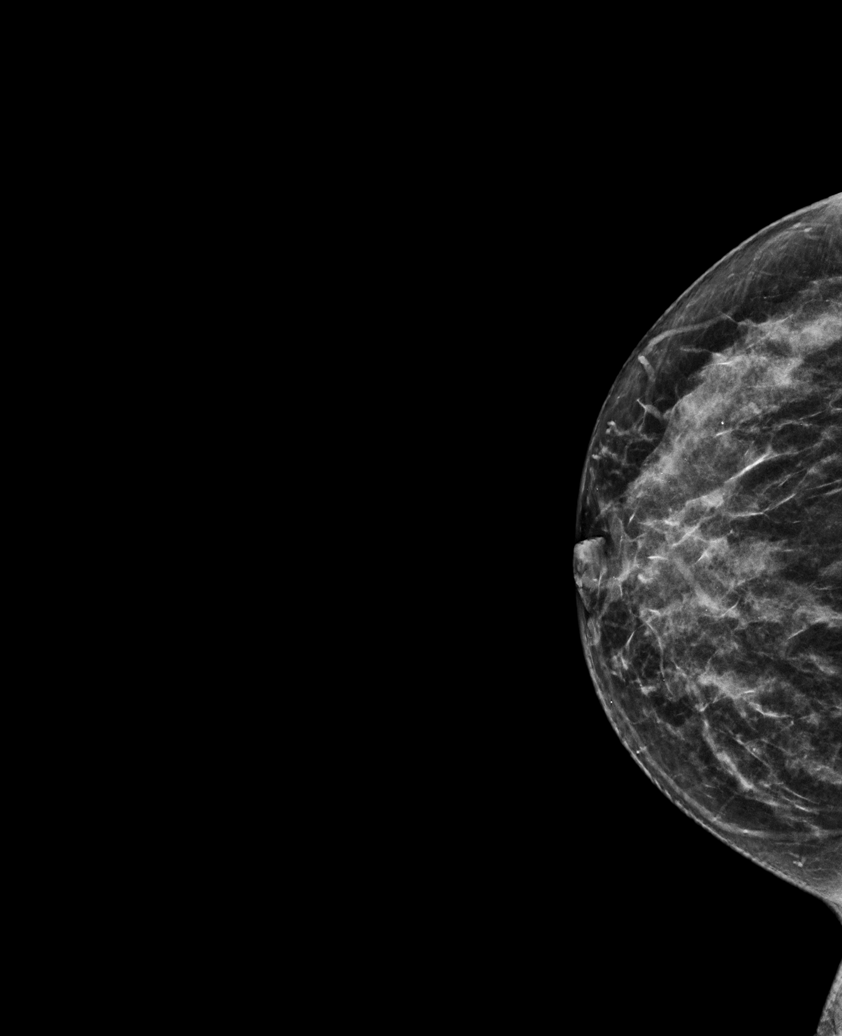

[L MLO tomo · tomo slice 37/74.0]
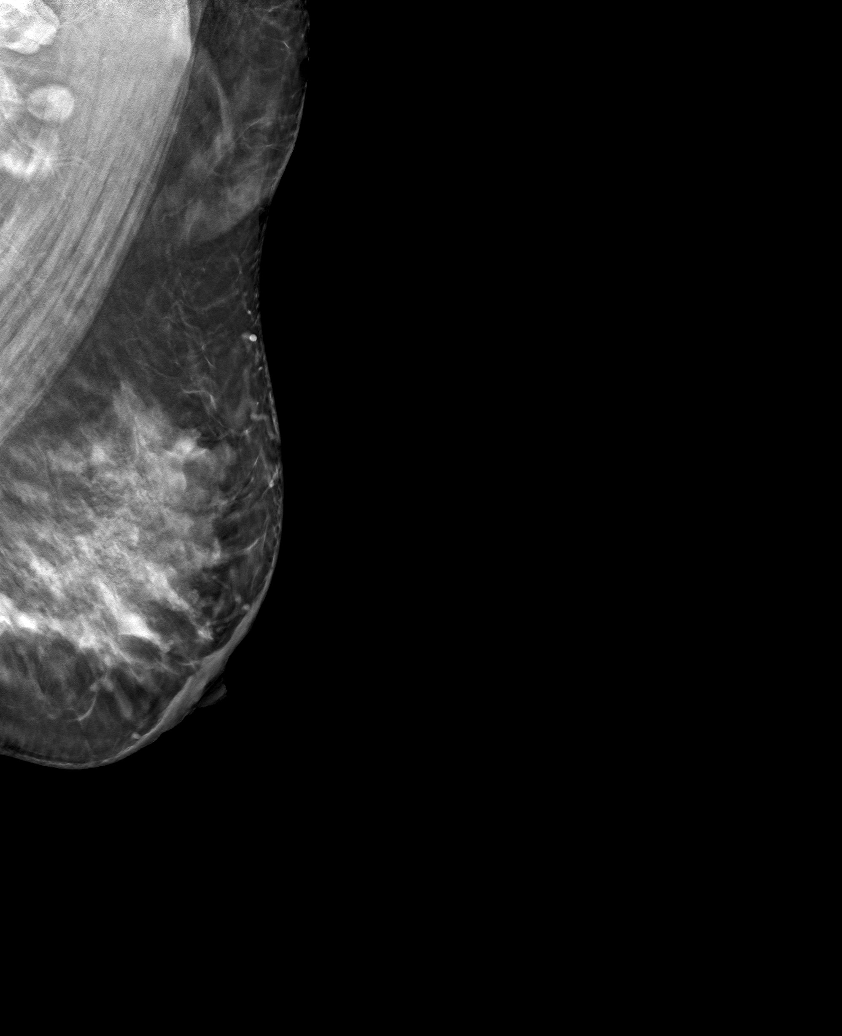

[6 of 30 positions shown; findings below may reference images not displayed]

ACR Breast Density Category c: The breast tissue is heterogeneously
dense, which may obscure small masses.
FINDINGS: There are no findings suspicious for malignancy. Images were
processed with CAD.
IMPRESSION: No mammographic evidence of malignancy. A result letter of this
screening mammogram will be mailed directly to the patient.

RECOMMENDATION:
Screening mammogram in one year. (Code:FT-U-LHB)

BI-RADS CATEGORY  1: Negative.

## 2021-10-17 ENCOUNTER — Other Ambulatory Visit (INDEPENDENT_AMBULATORY_CARE_PROVIDER_SITE_OTHER): Payer: Self-pay | Admitting: Primary Care

## 2021-10-17 ENCOUNTER — Other Ambulatory Visit: Payer: Self-pay

## 2021-10-17 DIAGNOSIS — I10 Essential (primary) hypertension: Secondary | ICD-10-CM

## 2021-10-17 DIAGNOSIS — E119 Type 2 diabetes mellitus without complications: Secondary | ICD-10-CM

## 2021-10-17 DIAGNOSIS — Z760 Encounter for issue of repeat prescription: Secondary | ICD-10-CM

## 2021-10-17 NOTE — Telephone Encounter (Signed)
Sent to PCP ?

## 2021-10-21 ENCOUNTER — Encounter (INDEPENDENT_AMBULATORY_CARE_PROVIDER_SITE_OTHER): Payer: Self-pay | Admitting: Primary Care

## 2021-10-21 ENCOUNTER — Ambulatory Visit (INDEPENDENT_AMBULATORY_CARE_PROVIDER_SITE_OTHER): Payer: Self-pay | Admitting: Primary Care

## 2021-10-21 ENCOUNTER — Other Ambulatory Visit: Payer: Self-pay

## 2021-10-21 VITALS — BP 134/83 | HR 81 | Temp 97.3°F | Ht 59.0 in | Wt 164.0 lb

## 2021-10-21 DIAGNOSIS — E119 Type 2 diabetes mellitus without complications: Secondary | ICD-10-CM

## 2021-10-21 DIAGNOSIS — R748 Abnormal levels of other serum enzymes: Secondary | ICD-10-CM

## 2021-10-21 DIAGNOSIS — Z1211 Encounter for screening for malignant neoplasm of colon: Secondary | ICD-10-CM

## 2021-10-21 DIAGNOSIS — I10 Essential (primary) hypertension: Secondary | ICD-10-CM

## 2021-10-21 DIAGNOSIS — E782 Mixed hyperlipidemia: Secondary | ICD-10-CM

## 2021-10-21 DIAGNOSIS — D508 Other iron deficiency anemias: Secondary | ICD-10-CM

## 2021-10-21 DIAGNOSIS — Z760 Encounter for issue of repeat prescription: Secondary | ICD-10-CM

## 2021-10-21 LAB — POCT GLYCOSYLATED HEMOGLOBIN (HGB A1C): Hemoglobin A1C: 5.9 % — AB (ref 4.0–5.6)

## 2021-10-21 MED ORDER — METFORMIN HCL ER 500 MG PO TB24
500.0000 mg | ORAL_TABLET | Freq: Every day | ORAL | 1 refills | Status: DC
  Filled 2021-10-21: qty 30, 30d supply, fill #0
  Filled 2021-11-22: qty 30, 30d supply, fill #1
  Filled 2021-12-24: qty 30, 30d supply, fill #0
  Filled 2022-01-23: qty 30, 30d supply, fill #1
  Filled 2022-02-24: qty 30, 30d supply, fill #2
  Filled 2022-03-20: qty 30, 30d supply, fill #3

## 2021-10-21 MED ORDER — LISINOPRIL 20 MG PO TABS
ORAL_TABLET | Freq: Every day | ORAL | 0 refills | Status: DC
  Filled 2021-10-21: qty 30, 30d supply, fill #0
  Filled 2021-11-22: qty 30, 30d supply, fill #1
  Filled 2021-12-24: qty 30, 30d supply, fill #0

## 2021-10-21 MED ORDER — HYDROCHLOROTHIAZIDE 25 MG PO TABS
25.0000 mg | ORAL_TABLET | Freq: Every day | ORAL | 1 refills | Status: DC
  Filled 2021-11-22 – 2021-12-24 (×2): qty 30, 30d supply, fill #0
  Filled 2022-01-23: qty 30, 30d supply, fill #1
  Filled 2022-02-24: qty 30, 30d supply, fill #2
  Filled 2022-03-20: qty 30, 30d supply, fill #3
  Filled 2022-04-23: qty 30, 30d supply, fill #4

## 2021-10-21 NOTE — Patient Instructions (Signed)
Recuento de caloras para bajar de peso Calorie Counting for Weight Loss Las caloras son unidades de energa. El cuerpo necesita una cierta cantidad de caloras de los alimentos para que lo ayuden a funcionar durante todo el da. Cuando se comen o beben ms caloras de las que el cuerpo necesita, este acumula las caloras adicionales mayormente como grasa. Cuando se comen o beben menos caloras de las que el cuerpo necesita, este quema grasa para obtener laenerga que necesita. El recuento de caloras es el registro de la cantidad de caloras que se comen y beben cada da. El recuento de caloras puede ser de ayuda si necesita perder peso. Si come menos caloras de las que el cuerpo necesita, debera bajar depeso. Pregntele al mdico cul es un peso sano para usted. Para que el recuento de caloras funcione, usted tendr que ingerir la cantidad de caloras adecuadas cada da, para bajar una cantidad de peso saludable por semana. Un nutricionista puede ayudar a determinar la cantidad de caloras que usted necesita por da y sugerirle formas de alcanzar su objetivo calrico. Una cantidad de peso saludable para bajar cada semana suele ser entre 1 y 2 libras (0.5 a 0.9 kg). Esto habitualmente significa que su ingesta diaria de caloras se debera reducir en unas 500 a 750 caloras. Ingerir de 1200 a 1500 caloras por da puede ayudar a la mayora de las mujeres a bajar de peso. Ingerir de 1500 a 1800 caloras por da puede ayudar a la mayora de los hombres a bajar de peso. Qu debo saber acerca del recuento de caloras? Trabaje con el mdico o el nutricionista para determinar cuntas caloras debe recibir cada da. A fin de alcanzar su objetivo diario de caloras, tendr que: Averiguar cuntas caloras hay en cada alimento que le gustara comer. Intente hacerlo antes de comer. Decidir la cantidad que puede comer del alimento. Llevar un registro de los alimentos. Para esto, anote lo que comi y cuntas  caloras tena. Para perder peso con xito, es importante equilibrar el recuento de calorascon un estilo de vida saludable que incluya actividad fsica de forma regular. Dnde encuentro informacin sobre las caloras?  Es posible encontrar la cantidad de caloras que contiene un alimento en la etiqueta de informacin nutricional. Si un alimento no tiene una etiqueta de informacin nutricional, intente buscar las caloras en Internet o pida ayudaal nutricionista. Recuerde que las caloras se calculan por porcin. Si opta por comer ms de una porcin de un alimento, tendr que multiplicar las caloras de una porcin por la cantidad de porciones que planea comer. Por ejemplo, la etiqueta de un envase de pan puede decir que el tamao de una porcin es 1 rodaja, y que una porcin tiene 90 caloras. Si come 1 rodaja, habr comido 90 caloras. Si come2 rodajas, habr comido 180 caloras. Cmo llevo un registro de comidas? Despus de cada vez que coma, anote lo siguiente en el registro de alimentos lo antes posible: Lo que comi. Asegrese de incluir los aderezos, las salsas y otros extras en los alimentos. La cantidad que comi. Esto se puede medir en tazas, onzas o cantidad de alimentos. Cuntas caloras haba en cada alimento y en cada bebida. La cantidad total de caloras en la comida que tom. Tenga a mano el registro de alimentos, por ejemplo, en un anotador de bolsillo o utilice una aplicacin o sitio web en el telfono mvil. Algunos programas calcularn las caloras por usted y le mostrarn la cantidad de caloras que lequedan para llegar   al objetivo diario. Cules son algunos consejos para controlar las porciones? Sepa cuntas caloras hay en una porcin. Esto lo ayudar a saber cuntas porciones de un alimento determinado puede comer. Use una taza medidora para medir los tamaos de las porciones. Tambin puede intentar pesar las porciones en una balanza de cocina. Con el tiempo, podr hacer un  clculo estimativo de los tamaos de las porciones de algunos alimentos. Dedique tiempo a poner porciones de diferentes alimentos en sus platos, tazones y tazas predilectos, a fin de saber cmo se ve una porcin. Intente no comer directamente de un envase de alimentos, por ejemplo, de una bolsa o una caja. Comer directamente del envase dificulta ver cunto est comiendo y puede conducir a comer en exceso. Ponga la cantidad que le gustara comer en una taza o un plato, a fin de asegurarse de que est comiendo la porcin correcta. Use platos, vasos y tazones ms pequeos para medir porciones ms pequeas y evitar no comer en exceso. Intente no realizar varias tareas al mismo tiempo. Por ejemplo, evite mirar televisin o usar la computadora mientras come. Si es la hora de comer, sintese a la mesa y disfrute de la comida. Esto lo ayudar a reconocer cundo est satisfecho. Tambin le permitir estar ms consciente de qu come y cunto come. Consejos para seguir este plan Al leer las etiquetas de los alimentos Controle el recuento de caloras en comparacin con el tamao de la porcin. El tamao de la porcin puede ser ms pequeo de lo que suele comer. Verifique la fuente de las caloras. Intente elegir alimentos ricos en protenas, fibras y vitaminas, y bajos en grasas saturadas, grasas trans y sodio. Al ir de compras Lea las etiquetas nutricionales cuando compre. Esto lo ayudar a tomar decisiones saludables sobre qu alimentos comprar. Preste atencin a las etiquetas nutricionales de alimentos bajos en grasas o sin grasas. Estos alimentos a veces tienen la misma cantidad de caloras o ms caloras que las versiones ricas en grasas. Con frecuencia, tambin tienen agregados de azcar, almidn o sal, para darles el sabor que fue eliminado con las grasas. Haga una lista de compras con los alimentos que tienen un menor contenido de caloras y resptela. Al cocinar Intente cocinar sus alimentos preferidos de  una manera ms saludable. Por ejemplo, pruebe hornear en vez de frer. Utilice productos lcteos descremados. Planificacin de las comidas Utilice ms frutas y verduras. La mitad de su plato debe ser de frutas y verduras. Incluya protenas magras, como pollo, pavo y pescado. Estilo de vida Cada semana, trate de hacer una de las siguientes cosas: 150 minutos de ejercicio moderado, como caminar. 75 minutos de ejercicio enrgico, como correr. Informacin general Sepa cuntas caloras tienen los alimentos que come con ms frecuencia. Esto le ayudar a contar las caloras ms rpidamente. Encuentre un mtodo para controlar las caloras que funcione para usted. Sea creativo. Pruebe aplicaciones o programas distintos, si llevar un registro de las caloras no funciona para usted. Qu alimentos debo consumir?  Consuma alimentos nutritivos. Es mejor comer un alimento nutritivo, de alto contenido calrico, como un aguacate, que uno con pocos nutrientes, como una bolsa de patatas fritas. Use sus caloras en alimentos y bebidas que lo sacien y no lo dejen con apetito apenas termina de comer. Ejemplos de alimentos que lo sacian son los frutos secos y mantequillas de frutos secos, verduras, protenas magras y alimentos con alto contenido de fibra como los cereales integrales. Los alimentos con alto contenido de fibra son aquellos que   tienen ms de 5 g de fibra por porcin. Preste atencin a las caloras en las bebidas. Las bebidas de bajas caloras incluyen agua y refrescos sin azcar. Es posible que los productos que se enumeran ms arriba no constituyan una lista completa de los alimentos y las bebidas que puede tomar. Consulte a un nutricionista para obtener ms informacin. Qu alimentos debo limitar? Limite el consumo de alimentos o bebidas que no sean buenas fuentes de vitaminas, minerales o protenas, o que tengan alto contenido de grasas no saludables. Estos incluyen: Caramelos. Otros  dulces. Refrescos, bebidas con caf especiales, alcohol y jugo. Es posible que los productos que se enumeran ms arriba no constituyan una lista completa de los alimentos y las bebidas que debe evitar. Consulte a un nutricionista para obtener ms informacin. Cmo puedo hacer el recuento de caloras cuando como afuera? Preste atencin a las porciones. A menudo, las porciones son mucho ms grandes al comer afuera. Pruebe con estos consejos para mantener las porciones ms pequeas: Considere la posibilidad de compartir una comida en lugar de tomarla toda usted solo. Si pide su propia comida, coma solo la mitad. Antes de empezar a comer, pida un recipiente y ponga la mitad de la comida en l. Cuando sea posible, considere la posibilidad de pedir porciones ms pequeas del men en lugar de porciones completas. Preste atencin a la eleccin de alimentos y bebidas. Saber la forma en que se cocinan los alimentos y lo que incluye la comida puede ayudarlo a ingerir menos caloras. Si se detallan las caloras en el men, elija las opciones que contengan la menor cantidad. Elija platos que incluyan verduras, frutas, cereales integrales, productos lcteos con bajo contenido de grasa y protenas magras. Opte por los alimentos hervidos, asados, cocidos a la parrilla o al vapor. Evite los alimentos a los que se les ponga mantequilla, que estn empanados o fritos, o que se sirvan con salsa a base de crema. Generalmente, los alimentos que se etiquetan como "crujientes" estn fritos, a menos que se indique lo contrario. Elija el agua, la leche descremada, el t helado sin azcar u otras bebidas que no contengan azcares agregados. Si desea una bebida alcohlica, escoja una opcin con menos caloras, como una copa de vino o una cerveza ligera. Ordene los aderezos, las salsas y los jarabes aparte. Estos son, con frecuencia, de alto contenido en caloras, por lo que debe limitar la cantidad que ingiere. Si desea una  ensalada, elija una de hortalizas y pida carnes a la parrilla. Evite las guarniciones adicionales como el tocino, el queso o los alimentos fritos. Ordene el aderezo aparte o pida aceite de oliva y vinagre o limn para aderezar. Haga un clculo estimativo de la cantidad de porciones que le sirven. Conocer el tamao de las porciones lo ayudar a estar atento a la cantidad de comida que come en los restaurantes. Dnde buscar ms informacin Centers for Disease Control and Prevention (Centros para el Control y la Prevencin de Enfermedades): www.cdc.gov U.[place]. Department of Agriculture (Departamento de Agricultura de los [place]): myplate.gov Resumen El recuento de caloras es el registro de la cantidad de caloras que se comen y beben cada da. Si come menos caloras de las que el cuerpo necesita, debera bajar de peso. Una cantidad de peso saludable para bajar por semana suele ser entre 1 y 2 libras (0.5 a 0.9 kg). Esto significa, con frecuencia, reducir su ingesta diaria de caloras unas 500 a 750 caloras. Es posible encontrar la cantidad de caloras que   contiene un alimento en la etiqueta de informacin nutricional. Si un alimento no tiene una etiqueta de informacin nutricional, intente buscar las caloras en Internet o pida ayuda al nutricionista. Use platos, vasos y tazones ms pequeos para medir porciones ms pequeas y evitar no comer en exceso. Use sus caloras en alimentos y bebidas que lo sacien y no lo dejen con apetito poco tiempo despus de haber comido. Esta informacin no tiene como fin reemplazar el consejo del mdico. Asegresede hacerle al mdico cualquier pregunta que tenga. Document Revised: 03/19/2020 Document Reviewed: 03/19/2020 Elsevier Patient Education  2022 [employer]  

## 2021-10-21 NOTE — Progress Notes (Signed)
[facility]  Subjective:  Patient ID: Susan [name], female    DOB: 08-11-1976  Age: 45 y.o. MRN: [id number]  CC: Hypertension, Diabetes, and Medication Refill   HPI [name] is a 45 year old Hispanic female ( interpreter Ignacia Bayley [phone]) presents for follow-up of diabetes. Patient does not check blood sugar at home. Unless not feeling well.   Compliant with meds - Yes Checking CBGs? No  Fasting avg -   Postprandial average -  Exercising regularly? - Yes Watching carbohydrate intake? - Yes Neuropathy ? 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 Taking them regularly without complication/adverse reaction being reported today.   History Susan [name] has a past medical history of Diabetes 1.5, managed as type 2 (HCC) and Hypertension.   She has no past surgical history on file.   Her family history includes Diabetes in her father, mother, sister, and sister; Hypertension in her father.She reports that she has never smoked. She has never used smokeless tobacco. She reports that she does not drink alcohol and does not use drugs.  Current Outpatient Medications on File Prior to Visit  Medication Sig Dispense Refill   atorvastatin (LIPITOR) 20 MG tablet TAKE 1 TABLET (20 MG TOTAL) BY MOUTH DAILY. 90 tablet 1   ibuprofen (ADVIL) 800 MG tablet Take 1 tablet (800 mg total) by mouth every 8 (eight) hours as needed for moderate pain. 90 tablet 1   No current facility-administered medications on file prior to visit.    ROS Comprehensive ROS noted in HPI  Objective:  BP 134/83 (BP Location: Right Arm, Patient Position: Sitting, Cuff Size: Normal)   Pulse 81   Temp (!) 97.3 F (36.3 C) (Temporal)   Ht  4\' 11"  (1.499 m)   Wt 164 lb (74.4 kg)   LMP 10/19/2021 (Exact Date)   SpO2 100%   BMI 33.12 kg/m   BP Readings from Last 3 Encounters:  10/21/21 134/83  04/15/21 121/78  01/03/21 130/82    Wt Readings from Last 3 Encounters:  10/21/21 164 lb (74.4 kg)  04/15/21 168 lb 3.2 oz (76.3 kg)  01/03/21 168 lb 8 oz (76.4 kg)   Physical exam: General: Vital signs reviewed.  Patient is well-developed and well-nourished, obese female  in no acute distress and cooperative with exam. Head: Normocephalic and atraumatic. Eyes: EOMI, conjunctivae normal, no scleral icterus. Neck: Supple, trachea midline, normal ROM, no JVD, masses, thyromegaly, or carotid bruit present. Cardiovascular: RRR, S1 normal, S2 normal, no murmurs, gallops, or rubs. Pulmonary/Chest: Clear to auscultation bilaterally, no wheezes, rales, or rhonchi. Abdominal: Soft, non-tender, non-distended, BS +, no masses, organomegaly, or guarding present. Musculoskeletal: No joint deformities, erythema, or stiffness, ROM full and nontender. Extremities: No lower extremity edema bilaterally,  pulses symmetric and intact bilaterally. No cyanosis or clubbing. Neurological: A&O x3, Strength is normal Skin: Warm, dry and intact. No rashes or erythema. Psychiatric: Normal mood and affect. speech and behavior is normal. Cognition and memory are normal.    Lab Results  Component Value Date   HGBA1C 5.9 (A) 10/21/2021   HGBA1C 6.0 (A) 04/15/2021   HGBA1C 5.9 (A) 10/16/2020    Lab Results  Component Value Date   WBC 7.6 11/28/2020  HGB 10.4 (L) 11/28/2020   HCT 32.5 (L) 11/28/2020   PLT 177 11/28/2020   GLUCOSE 219 (H) 11/28/2020   CHOL 116 10/16/2020   TRIG 123 10/16/2020   HDL 30 (L) 10/16/2020   LDLCALC 64 10/16/2020   ALT 95 (H) 11/28/2020   AST 72 (H) 11/28/2020   NA 124 (L) 11/28/2020   K 3.1 (L) 11/28/2020   CL 87 (L) 11/28/2020   CREATININE 0.66 11/28/2020   BUN 7 11/28/2020   CO2 24 11/28/2020   HGBA1C 5.9 (A)  10/21/2021     Assessment & Plan:  Susan [name] was seen today for hypertension, diabetes and medication refill.  Diagnoses and all orders for this visit:  Type 2 diabetes mellitus without complication, without long-term current use of insulin (HCC) -     HgB A1c 5.9  Well controlled- d/c glipizide and metformin reduce hypoglycemia . Prescribed metformin XR 500mg  daily . Continue to monitor foods that are high in carbohydrates are the following rice, potatoes, breads, sugars, tortillas and pastas.  Reduction in the intake (eating) will assist in lowering your blood sugars.   Elevated liver enzymes CMP denies alcohol or excessive tylenol use   Colon cancer screening -     Fecal occult blood, imunochemical; Future  Essential hypertension She did not take medication today fasting  Counseled on blood pressure goal of less than 130/80, low-sodium, DASH diet, medication compliance, 150 minutes of moderate intensity exercise per week. Discussed medication compliance, adverse effects.  -     lisinopril (ZESTRIL) 20 MG tablet; TAKE 1 TABLET (20 MG TOTAL) BY MOUTH DAILY. -     hydrochlorothiazide (HYDRODIURIL) 25 MG tablet; Take 1 tablet (25 mg total) by mouth daily.  Other orders -     metFORMIN (GLUCOPHAGE XR) 500 MG 24 hr tablet; Take 1 tablet (500 mg total) by mouth daily with breakfast.    I have discontinued Soto's metFORMIN and glipiZIDE. I am also having her start on metFORMIN. Additionally, I am having her maintain her atorvastatin, ibuprofen, lisinopril, and hydrochlorothiazide.  Meds ordered this encounter  Medications   metFORMIN (GLUCOPHAGE XR) 500 MG 24 hr tablet    Sig: Take 1 tablet (500 mg total) by mouth daily with breakfast.    Dispense:  90 tablet    Refill:  1   lisinopril (ZESTRIL) 20 MG tablet    Sig: TAKE 1 TABLET (20 MG TOTAL) BY MOUTH DAILY.    Dispense:  90 tablet    Refill:  0   hydrochlorothiazide (HYDRODIURIL) 25 MG tablet    Sig: Take 1  tablet (25 mg total) by mouth daily.    Dispense:  90 tablet    Refill:  1     Follow-up:   Return in about 6 months (around 04/20/2022) for BP/HTN.  The above assessment and management plan was discussed with the patient. The patient verbalized understanding of and has agreed to the management plan. Patient is aware to call the clinic if symptoms fail to improve or worsen. Patient is aware when to return to the clinic for a follow-up visit. Patient educated on when it is appropriate to go to the emergency department.   04/22/2022, NP-C

## 2021-10-22 ENCOUNTER — Other Ambulatory Visit: Payer: Self-pay

## 2021-10-22 LAB — CMP14+EGFR
ALT: 16 IU/L (ref 0–32)
AST: 12 IU/L (ref 0–40)
Albumin/Globulin Ratio: 1.4 (ref 1.2–2.2)
Albumin: 4.3 g/dL (ref 3.8–4.8)
Alkaline Phosphatase: 102 IU/L (ref 44–121)
BUN/Creatinine Ratio: 20 (ref 9–23)
BUN: 14 mg/dL (ref 6–24)
Bilirubin Total: <0.2 mg/dL (ref 0.0–1.2)
CO2: 23 mmol/L (ref 20–29)
Calcium: 9.3 mg/dL (ref 8.7–10.2)
Chloride: 99 mmol/L (ref 96–106)
Creatinine, Ser: 0.7 mg/dL (ref 0.57–1.00)
Globulin, Total: 3.1 g/dL (ref 1.5–4.5)
Glucose: 141 mg/dL — ABNORMAL HIGH (ref 70–99)
Potassium: 4.1 mmol/L (ref 3.5–5.2)
Sodium: 138 mmol/L (ref 134–144)
Total Protein: 7.4 g/dL (ref 6.0–8.5)
eGFR: 108 mL/min/{1.73_m2} (ref >59)

## 2021-10-22 LAB — CBC WITH DIFFERENTIAL/PLATELET
Basophils Absolute: 0 10*3/uL (ref 0.0–0.2)
Basos: 0 %
EOS (ABSOLUTE): 0.1 10*3/uL (ref 0.0–0.4)
Eos: 2 %
Hematocrit: 32 % — ABNORMAL LOW (ref 34.0–46.6)
Hemoglobin: 10.3 g/dL — ABNORMAL LOW (ref 11.1–15.9)
Immature Grans (Abs): 0.1 10*3/uL (ref 0.0–0.1)
Immature Granulocytes: 1 %
Lymphocytes Absolute: 2.9 10*3/uL (ref 0.7–3.1)
Lymphs: 31 %
MCH: 23.5 pg — ABNORMAL LOW (ref 26.6–33.0)
MCHC: 32.2 g/dL (ref 31.5–35.7)
MCV: 73 fL — ABNORMAL LOW (ref 79–97)
Monocytes Absolute: 0.8 10*3/uL (ref 0.1–0.9)
Monocytes: 8 %
Neutrophils Absolute: 5.5 10*3/uL (ref 1.4–7.0)
Neutrophils: 58 %
Platelets: 273 10*3/uL (ref 150–450)
RBC: 4.38 x10E6/uL (ref 3.77–5.28)
RDW: 14.8 % (ref 11.7–15.4)
WBC: 9.4 10*3/uL (ref 3.4–10.8)

## 2021-10-22 LAB — LIPID PANEL
Chol/HDL Ratio: 5 ratio — ABNORMAL HIGH (ref 0.0–4.4)
Cholesterol, Total: 125 mg/dL (ref 100–199)
HDL: 25 mg/dL — ABNORMAL LOW (ref >39)
LDL Chol Calc (NIH): 69 mg/dL (ref 0–99)
Triglycerides: 183 mg/dL — ABNORMAL HIGH (ref 0–149)
VLDL Cholesterol Cal: 31 mg/dL (ref 5–40)

## 2021-10-23 ENCOUNTER — Other Ambulatory Visit (INDEPENDENT_AMBULATORY_CARE_PROVIDER_SITE_OTHER): Payer: Self-pay | Admitting: Primary Care

## 2021-10-23 DIAGNOSIS — Z760 Encounter for issue of repeat prescription: Secondary | ICD-10-CM

## 2021-10-23 DIAGNOSIS — E782 Mixed hyperlipidemia: Secondary | ICD-10-CM

## 2021-10-23 MED ORDER — ATORVASTATIN CALCIUM 20 MG PO TABS
ORAL_TABLET | Freq: Every day | ORAL | 1 refills | Status: DC
  Filled 2021-10-23: qty 90, fill #0
  Filled 2021-11-22 – 2021-12-24 (×4): qty 30, 30d supply, fill #0
  Filled 2022-01-23: qty 30, 30d supply, fill #1
  Filled 2022-02-24: qty 30, 30d supply, fill #2
  Filled 2022-03-20: qty 30, 30d supply, fill #3
  Filled 2022-04-23: qty 30, 30d supply, fill #4

## 2021-10-24 ENCOUNTER — Other Ambulatory Visit: Payer: Self-pay

## 2021-10-25 ENCOUNTER — Telehealth (INDEPENDENT_AMBULATORY_CARE_PROVIDER_SITE_OTHER): Payer: Self-pay

## 2021-10-25 NOTE — Telephone Encounter (Signed)
Call placed to patient using pacific interpreter [id number]) patient aware of results and medication being sent. Provided with dietary advise. She verbalized understanding. Maryjean Morn, CMA

## 2021-10-25 NOTE — Telephone Encounter (Signed)
-----   Message from Grayce Sessions, NP sent at 10/23/2021  9:44 PM EST ----- Anemia of chronic disease.  Your cholesterol is high, Increase risk of heart attack and/or stroke.  To reduce your Cholesterol , Remember - more fruits and vegetables, more fish, and limit red meat and dairy products. More soy, nuts, beans, barley, lentils, oats and plant sterol ester enriched margarine instead of butter. I also encourage eliminating sugar and processed food. Script sent for atorvastatin 20mg  take at bedtime

## 2021-11-22 ENCOUNTER — Other Ambulatory Visit: Payer: Self-pay

## 2021-12-11 ENCOUNTER — Other Ambulatory Visit: Payer: Self-pay

## 2021-12-11 DIAGNOSIS — Z1231 Encounter for screening mammogram for malignant neoplasm of breast: Secondary | ICD-10-CM

## 2021-12-24 ENCOUNTER — Other Ambulatory Visit: Payer: Self-pay

## 2021-12-25 ENCOUNTER — Other Ambulatory Visit: Payer: Self-pay

## 2021-12-27 ENCOUNTER — Other Ambulatory Visit: Payer: Self-pay

## 2022-01-09 ENCOUNTER — Ambulatory Visit
Admission: RE | Admit: 2022-01-09 | Discharge: 2022-01-09 | Disposition: A | Discharge status: Home / Self Care | Payer: No Typology Code available for payment source | Source: Ambulatory Visit | Attending: Primary Care | Admitting: Primary Care

## 2022-01-09 ENCOUNTER — Other Ambulatory Visit: Payer: Self-pay

## 2022-01-09 DIAGNOSIS — Z1231 Encounter for screening mammogram for malignant neoplasm of breast: Secondary | ICD-10-CM

## 2022-01-23 ENCOUNTER — Other Ambulatory Visit (INDEPENDENT_AMBULATORY_CARE_PROVIDER_SITE_OTHER): Payer: Self-pay | Admitting: Primary Care

## 2022-01-23 ENCOUNTER — Other Ambulatory Visit: Payer: Self-pay

## 2022-01-23 DIAGNOSIS — Z760 Encounter for issue of repeat prescription: Secondary | ICD-10-CM

## 2022-01-23 DIAGNOSIS — I10 Essential (primary) hypertension: Secondary | ICD-10-CM

## 2022-01-23 MED ORDER — LISINOPRIL 20 MG PO TABS
ORAL_TABLET | Freq: Every day | ORAL | 0 refills | Status: DC
  Filled 2022-01-23: qty 30, 30d supply, fill #0
  Filled 2022-02-24: qty 30, 30d supply, fill #1
  Filled 2022-03-20: qty 30, 30d supply, fill #2

## 2022-02-24 ENCOUNTER — Other Ambulatory Visit: Payer: Self-pay

## 2022-02-25 ENCOUNTER — Other Ambulatory Visit: Payer: Self-pay

## 2022-03-18 ENCOUNTER — Ambulatory Visit (INDEPENDENT_AMBULATORY_CARE_PROVIDER_SITE_OTHER): Payer: Self-pay | Admitting: Primary Care

## 2022-03-18 ENCOUNTER — Encounter (INDEPENDENT_AMBULATORY_CARE_PROVIDER_SITE_OTHER): Payer: Self-pay | Admitting: Primary Care

## 2022-03-18 VITALS — BP 124/75 | HR 78 | Temp 98.2°F | Ht 59.0 in | Wt 151.2 lb

## 2022-03-18 DIAGNOSIS — R300 Dysuria: Secondary | ICD-10-CM

## 2022-03-18 LAB — POCT URINALYSIS DIP (CLINITEK)
Bilirubin, UA: NEGATIVE
Blood, UA: NEGATIVE
Glucose, UA: NEGATIVE mg/dL
Ketones, POC UA: NEGATIVE mg/dL
Leukocytes, UA: NEGATIVE
Nitrite, UA: NEGATIVE
POC PROTEIN,UA: NEGATIVE
Spec Grav, UA: 1.015 (ref 1.010–1.025)
Urobilinogen, UA: 0.2 E.U./dL
pH, UA: 5 (ref 5.0–8.0)

## 2022-03-18 NOTE — Progress Notes (Signed)
? ?   [facility] ? ? ? ? ? ? ?Subjective:  ? ? Everlyn Farabaugh is a 46 y.o. female who complains of burning with urination, frequency, and incontinence for 3 years.  Patient also complains of back pain and vaginal discharge. Patient denies congestion, cough, fever, headache, rhinitis, sorethroat, and stomach ache.  Patient does have a history of recurrent UTI.(Per patient)  Patient does not have a history of pyelonephritis. ?The following portions of the patient's history were reviewed and updated as appropriate: allergies, current medications, past family history, past medical history, past social history, and past surgical history. ?Review of Systems ?Pertinent items noted in HPI and remainder of comprehensive ROS otherwise negative.  ?  ?Objective:  ? ? BP 124/75 (BP Location: Right Arm, Patient Position: Sitting, Cuff Size: Normal)   Pulse 78   Temp 98.2 ?F (36.8 ?C) (Oral)   Ht 4\' 11"  (1.499 m)   Wt 151 lb 3.2 oz (68.6 kg)   LMP 02/28/2022 (Approximate)   SpO2 100%   BMI 30.54 kg/m?  ?Physical Exam ?Vitals reviewed.  ?Constitutional:   ?   Appearance: She is obese.  ?HENT:  ?   Head: Normocephalic.  ?   Nose: Nose normal.  ?Eyes:  ?   Extraocular Movements: Extraocular movements intact.  ?Cardiovascular:  ?   Rate and Rhythm: Normal rate and regular rhythm.  ?Pulmonary:  ?   Effort: Pulmonary effort is normal.  ?   Breath sounds: Normal breath sounds.  ?Abdominal:  ?   General: Bowel sounds are normal. There is distension.  ?   Palpations: Abdomen is soft.  ?Musculoskeletal:     ?   General: Normal range of motion.  ?   Cervical back: Normal range of motion and neck supple.  ?Skin: ?   General: Skin is warm and dry.  ?Neurological:  ?   Mental Status: She is alert and oriented to person, place, and time.  ?Psychiatric:     ?   Mood and Affect: Mood normal.     ?   Behavior: Behavior normal.     ?   Thought Content: Thought content normal.     ?   Judgment: Judgment normal.  ?   ? ? ?Laboratory:  ?Urine dipstick shows negative for all components.   ?Micro exam: not done.  ?  ?Assessment:  ? ? Genital irritation  ?  ? 1. Medications: not indicated at this time ?2. Maintain adequate hydration ?3. Follow up if symptoms not improving, and prn.  ? ? ?This note has been created with 03/02/2022. Any transcriptional errors are unintentional.  ? ?Education officer, environmental, NP ?03/18/2022, 4:33 PM  ? ? ?

## 2022-03-20 ENCOUNTER — Other Ambulatory Visit: Payer: Self-pay

## 2022-04-21 ENCOUNTER — Ambulatory Visit (INDEPENDENT_AMBULATORY_CARE_PROVIDER_SITE_OTHER): Payer: No Typology Code available for payment source | Admitting: Primary Care

## 2022-04-23 ENCOUNTER — Other Ambulatory Visit: Payer: Self-pay

## 2022-04-23 ENCOUNTER — Other Ambulatory Visit (INDEPENDENT_AMBULATORY_CARE_PROVIDER_SITE_OTHER): Payer: Self-pay | Admitting: Primary Care

## 2022-04-23 DIAGNOSIS — Z760 Encounter for issue of repeat prescription: Secondary | ICD-10-CM

## 2022-04-23 DIAGNOSIS — I10 Essential (primary) hypertension: Secondary | ICD-10-CM

## 2022-04-23 NOTE — Telephone Encounter (Signed)
Routed to PCP 

## 2022-04-25 ENCOUNTER — Other Ambulatory Visit: Payer: Self-pay

## 2022-04-25 DIAGNOSIS — Z760 Encounter for issue of repeat prescription: Secondary | ICD-10-CM

## 2022-04-25 DIAGNOSIS — I10 Essential (primary) hypertension: Secondary | ICD-10-CM

## 2022-04-25 MED ORDER — METFORMIN HCL ER 500 MG PO TB24: 500.0000 mg | ORAL_TABLET | Freq: Every day | ORAL | 1 refills | Status: DC

## 2022-04-25 MED ORDER — METFORMIN HCL ER 500 MG PO TB24
500.0000 mg | ORAL_TABLET | Freq: Every day | ORAL | 1 refills | Status: DC
  Filled 2022-04-25: qty 90, 90d supply, fill #0

## 2022-04-25 MED ORDER — LISINOPRIL 20 MG PO TABS: ORAL_TABLET | Freq: Every day | ORAL | 0 refills | Status: DC

## 2022-04-25 MED ORDER — LISINOPRIL 20 MG PO TABS
ORAL_TABLET | Freq: Every day | ORAL | 0 refills | Status: DC
  Filled 2022-04-25: qty 90, fill #0

## 2022-04-25 NOTE — Telephone Encounter (Addendum)
Pt is calling back, requesting an update on medication refills. Pt stated she is completely out of her medication.   Pt is requesting that if medication is approved this afternoon, please send it to the pharmacy below because [facility] will be closed by 6.     [id number] - [place] ([place]), [place] - [address]  [address] [place] ([place]) [place] [address]  Phone: [phone] Fax: [phone]  Hours: Not open 24 hours

## 2022-05-01 ENCOUNTER — Other Ambulatory Visit: Payer: Self-pay

## 2022-05-01 ENCOUNTER — Encounter (INDEPENDENT_AMBULATORY_CARE_PROVIDER_SITE_OTHER): Payer: Self-pay | Admitting: Primary Care

## 2022-05-01 ENCOUNTER — Ambulatory Visit (INDEPENDENT_AMBULATORY_CARE_PROVIDER_SITE_OTHER): Payer: Self-pay | Admitting: Primary Care

## 2022-05-01 VITALS — BP 133/77 | HR 79 | Temp 98.0°F | Ht 59.0 in | Wt 149.2 lb

## 2022-05-01 DIAGNOSIS — E119 Type 2 diabetes mellitus without complications: Secondary | ICD-10-CM

## 2022-05-01 DIAGNOSIS — Z760 Encounter for issue of repeat prescription: Secondary | ICD-10-CM

## 2022-05-01 DIAGNOSIS — Z1211 Encounter for screening for malignant neoplasm of colon: Secondary | ICD-10-CM

## 2022-05-01 DIAGNOSIS — I10 Essential (primary) hypertension: Secondary | ICD-10-CM

## 2022-05-01 DIAGNOSIS — D508 Other iron deficiency anemias: Secondary | ICD-10-CM

## 2022-05-01 DIAGNOSIS — E782 Mixed hyperlipidemia: Secondary | ICD-10-CM

## 2022-05-01 LAB — POCT GLYCOSYLATED HEMOGLOBIN (HGB A1C): Hemoglobin A1C: 7.1 % — AB (ref 4.0–5.6)

## 2022-05-01 MED ORDER — HYDROCHLOROTHIAZIDE 25 MG PO TABS
25.0000 mg | ORAL_TABLET | Freq: Every day | ORAL | 1 refills | Status: DC
  Filled 2022-05-01: qty 90, 90d supply, fill #0
  Filled 2022-06-04: qty 30, 30d supply, fill #0

## 2022-05-01 MED ORDER — LISINOPRIL 20 MG PO TABS
ORAL_TABLET | Freq: Every day | ORAL | 1 refills | Status: DC
  Filled 2022-05-01: qty 30, 30d supply, fill #0
  Filled 2022-06-04: qty 30, 30d supply, fill #1

## 2022-05-01 MED ORDER — METFORMIN HCL ER 500 MG PO TB24
500.0000 mg | ORAL_TABLET | Freq: Two times a day (BID) | ORAL | 1 refills | Status: DC
  Filled 2022-05-01: qty 60, 30d supply, fill #0
  Filled 2022-06-04: qty 60, 30d supply, fill #1

## 2022-05-01 MED ORDER — METFORMIN HCL ER 500 MG PO TB24
500.0000 mg | ORAL_TABLET | Freq: Every day | ORAL | 1 refills | Status: DC
  Filled 2022-05-01: qty 30, 30d supply, fill #0

## 2022-05-01 NOTE — Progress Notes (Signed)
Subjective:  Patient ID: Susan [name], female    DOB: 04-May-1976  Age: 46 y.o. MRN: [id number]  CC: Hypertension and Diabetes   HPI Ms. 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 Patient does not check blood sugar at home  Compliant with meds - Yes Checking CBGs? No  Fasting avg -   Postprandial average -  Exercising regularly? - Yes Watching carbohydrate intake? - Yes Neuropathy ? - No Hypoglycemic events - No  - Recovers with :   Pertinent ROS:  Polyuria - No Polydipsia - No Vision problems - No Management of Hypertension Denies shortness of breath, headaches, chest pain or lower extremity edema  Medications as noted below. Taking them regularly without complication/adverse reaction being reported today.   History Araceli has a past medical history of Diabetes 1.5, managed as type 2 ([place]) and Hypertension.   She has no past surgical history on file.   Her family history includes Diabetes in her father, mother, sister, and sister; Hypertension in her father.She reports that she has never smoked. She has never used smokeless tobacco. She reports that she does not drink alcohol and does not use drugs.  Current Outpatient Medications on File Prior to Visit  Medication Sig Dispense Refill   atorvastatin (LIPITOR) 20 MG tablet TAKE 1 TABLET (20 MG TOTAL) BY MOUTH DAILY. 90 tablet 1   ibuprofen (ADVIL) 800 MG tablet Take 1 tablet (800 mg total) by mouth every 8 (eight) hours as needed for moderate pain. 90 tablet 1   No current facility-administered medications on file prior to visit.    ROS Comprehensive ROS Pertinent positive and negative noted in HPI    Objective:  BP 133/77   Pulse 79   Temp 98 F (36.7 C) (Oral)   Ht 4\' 11"  (1.499 m)   Wt 149 lb 3.2 oz (67.7 kg)   LMP 04/26/2022 (Exact Date)   SpO2 99%   BMI 30.13 kg/m   BP Readings from Last 3 Encounters:  05/01/22 133/77   03/18/22 124/75  10/21/21 134/83    Wt Readings from Last 3 Encounters:  05/01/22 149 lb 3.2 oz (67.7 kg)  03/18/22 151 lb 3.2 oz (68.6 kg)  10/21/21 164 lb (74.4 kg)   Physical exam: General: Vital signs reviewed.  Patient is well-developed and well-nourished, obese female  in no acute distress and cooperative with exam. Head: Normocephalic and atraumatic. Eyes: EOMI, conjunctivae normal, no scleral icterus. Neck: Supple, trachea midline, normal ROM, no JVD, masses, thyromegaly, or carotid bruit present. Cardiovascular: RRR, S1 normal, S2 normal, no murmurs, gallops, or rubs. Pulmonary/Chest: Clear to auscultation bilaterally, no wheezes, rales, or rhonchi. Abdominal: Soft, non-tender, non-distended, BS +, no masses, organomegaly, or guarding present. Musculoskeletal: No joint deformities, erythema, or stiffness, ROM full and nontender. Extremities: No lower extremity edema bilaterally,  pulses symmetric and intact bilaterally. No cyanosis or clubbing. Neurological: A&O x3, Strength is normal Skin: Warm, dry and intact. No rashes or erythema. Psychiatric: Normal mood and affect. speech and behavior is normal. Cognition and memory are normal.    Lab Results  Component Value Date   HGBA1C 7.1 (A) 05/01/2022   HGBA1C 5.9 (A) 10/21/2021   HGBA1C 6.0 (A) 04/15/2021    Lab Results  Component Value Date   WBC 9.4 10/21/2021   HGB 10.3 (L) 10/21/2021   HCT 32.0 (L) 10/21/2021   PLT 273 10/21/2021   GLUCOSE 141 (H) 10/21/2021  CHOL 125 10/21/2021   TRIG 183 (H) 10/21/2021   HDL 25 (L) 10/21/2021   LDLCALC 69 10/21/2021   ALT 16 10/21/2021   AST 12 10/21/2021   NA 138 10/21/2021   K 4.1 10/21/2021   CL 99 10/21/2021   CREATININE 0.70 10/21/2021   BUN 14 10/21/2021   CO2 23 10/21/2021   HGBA1C 7.1 (A) 05/01/2022     Assessment & Plan:   Kessie was seen today for hypertension and diabetes.  Diagnoses and all orders for this visit:  Type 2 diabetes mellitus without  complication, without long-term current use of insulin (HCC) -     HgB A1c 7.1 -     metFORMIN (GLUCOPHAGE-XR) 500 MG 24 hr tablet; Take 1 tablet (500 mg total) by mouth twice daily (increased)  Other iron deficiency anemia Hx of ck CBC no s/s  Essential hypertension Counseled on blood pressure goal of less than 130/80, low-sodium, DASH diet, medication compliance, 150 minutes of moderate intensity exercise per week. Discussed medication compliance, adverse effects.  -     hydrochlorothiazide (HYDRODIURIL) 25 MG tablet; Take 1 tablet (25 mg total) by mouth daily. -     lisinopril (ZESTRIL) 20 MG tablet; TAKE 1 TABLET (20 MG TOTAL) BY MOUTH DAILY. -     metFORMIN (GLUCOPHAGE-XR) 500 MG 24 hr tablet; Take 1 tablet (500 mg total) by mouth daily with breakfast.  Medication refill -     hydrochlorothiazide (HYDRODIURIL) 25 MG tablet; Take 1 tablet (25 mg total) by mouth daily. -     lisinopril (ZESTRIL) 20 MG tablet; TAKE 1 TABLET (20 MG TOTAL) BY MOUTH DAILY. -     metFORMIN (GLUCOPHAGE-XR) 500 MG 24 hr tablet; Take 1 tablet (500 mg total) by mouth daily with breakfast.   I am having Susan [name] maintain her ibuprofen, atorvastatin, hydrochlorothiazide, lisinopril, and metFORMIN.  Meds ordered this encounter  Medications   hydrochlorothiazide (HYDRODIURIL) 25 MG tablet    Sig: Take 1 tablet (25 mg total) by mouth daily.    Dispense:  90 tablet    Refill:  1   lisinopril (ZESTRIL) 20 MG tablet    Sig: TAKE 1 TABLET (20 MG TOTAL) BY MOUTH DAILY.    Dispense:  90 tablet    Refill:  1   metFORMIN (GLUCOPHAGE-XR) 500 MG 24 hr tablet    Sig: Take 1 tablet (500 mg total) by mouth daily with breakfast.    Dispense:  90 tablet    Refill:  1     Follow-up:   No follow-ups on file.  The above assessment and management plan was discussed with the patient. The patient verbalized understanding of and has agreed to the management plan. Patient is aware to call the clinic if symptoms  fail to improve or worsen. Patient is aware when to return to the clinic for a follow-up visit. Patient educated on when it is appropriate to go to the emergency department.   Juluis Mire, NP-C

## 2022-06-04 ENCOUNTER — Other Ambulatory Visit: Payer: Self-pay

## 2022-06-04 ENCOUNTER — Other Ambulatory Visit (INDEPENDENT_AMBULATORY_CARE_PROVIDER_SITE_OTHER): Payer: Self-pay | Admitting: Primary Care

## 2022-06-04 DIAGNOSIS — Z760 Encounter for issue of repeat prescription: Secondary | ICD-10-CM

## 2022-06-04 DIAGNOSIS — E782 Mixed hyperlipidemia: Secondary | ICD-10-CM

## 2022-06-05 ENCOUNTER — Other Ambulatory Visit: Payer: Self-pay

## 2022-06-20 ENCOUNTER — Other Ambulatory Visit: Payer: Self-pay

## 2022-06-20 ENCOUNTER — Other Ambulatory Visit (INDEPENDENT_AMBULATORY_CARE_PROVIDER_SITE_OTHER): Payer: Self-pay | Admitting: Primary Care

## 2022-06-20 DIAGNOSIS — I10 Essential (primary) hypertension: Secondary | ICD-10-CM

## 2022-06-20 DIAGNOSIS — Z760 Encounter for issue of repeat prescription: Secondary | ICD-10-CM

## 2022-06-20 DIAGNOSIS — E782 Mixed hyperlipidemia: Secondary | ICD-10-CM

## 2022-06-20 DIAGNOSIS — E119 Type 2 diabetes mellitus without complications: Secondary | ICD-10-CM

## 2022-06-20 MED ORDER — HYDROCHLOROTHIAZIDE 25 MG PO TABS
25.0000 mg | ORAL_TABLET | Freq: Every day | ORAL | 0 refills | Status: DC
  Filled 2022-06-20 – 2022-07-04 (×2): qty 90, 90d supply, fill #0
  Filled 2022-07-11: qty 30, 30d supply, fill #0
  Filled 2022-08-12: qty 30, 30d supply, fill #1

## 2022-06-20 MED ORDER — ATORVASTATIN CALCIUM 20 MG PO TABS
ORAL_TABLET | Freq: Every day | ORAL | 0 refills | Status: DC
  Filled 2022-06-20: qty 30, 30d supply, fill #0
  Filled 2022-07-24: qty 30, 30d supply, fill #1

## 2022-06-20 MED ORDER — IBUPROFEN 800 MG PO TABS
800.0000 mg | ORAL_TABLET | Freq: Three times a day (TID) | ORAL | 0 refills | Status: DC | PRN
  Filled 2022-06-20: qty 90, 30d supply, fill #0

## 2022-06-20 MED ORDER — METFORMIN HCL ER 500 MG PO TB24
500.0000 mg | ORAL_TABLET | Freq: Two times a day (BID) | ORAL | 0 refills | Status: DC
  Filled 2022-06-20 – 2022-07-04 (×2): qty 180, 90d supply, fill #0
  Filled 2022-07-11: qty 60, 30d supply, fill #0
  Filled 2022-08-12: qty 60, 30d supply, fill #1

## 2022-06-20 MED ORDER — LISINOPRIL 20 MG PO TABS
ORAL_TABLET | Freq: Every day | ORAL | 0 refills | Status: DC
  Filled 2022-06-20: qty 90, fill #0
  Filled 2022-07-04 – 2022-07-11 (×2): qty 90, 90d supply, fill #0

## 2022-06-20 NOTE — Telephone Encounter (Signed)
Copied from CRM [id number]. Topic: General - Other >> Jun 20, 2022  9:11 AM Everette C wrote: Reason for CRM: Medication Refill - Medication: Rx #: [id number]  atorvastatin (LIPITOR) 20 MG tablet [[id number]]   Rx #: [id number]  lisinopril (ZESTRIL) 20 MG tablet [[id number]]   ibuprofen (ADVIL) 800 MG tablet [[id number]] Rx #: [id number]  ibuprofen (ADVIL) 800 MG tablet [[id number]]   Rx #: [id number]  metFORMIN (GLUCOPHAGE-XR) 500 MG 24 hr tablet [[id number]]   Rx #: [id number]  hydrochlorothiazide (HYDRODIURIL) 25 MG tablet [[id number]]   Has the patient contacted their pharmacy? Yes.   (Agent: If no, request that the patient contact the pharmacy for the refill. If patient does not wish to contact the pharmacy document the reason why and proceed with request.) (Agent: If yes, when and what did the pharmacy advise?)  Preferred Pharmacy (with phone number or street name): [facility] [address] [place] [address] Phone: [phone] Fax: [phone] Hours: M-F 7:30a-6:00p   Has the patient been seen for an appointment in the last year OR does the patient have an upcoming appointment? Yes.    Agent: Please be advised that RX refills may take up to 3 business days. We ask that you follow-up with your pharmacy.

## 2022-06-20 NOTE — Telephone Encounter (Signed)
Requested Prescriptions  Pending Prescriptions Disp Refills  . atorvastatin (LIPITOR) 20 MG tablet 90 tablet 0    Sig: TAKE 1 TABLET (20 MG TOTAL) BY MOUTH DAILY.     Cardiovascular:  Antilipid - Statins Failed - 06/20/2022 10:03 AM      Failed - Lipid Panel in normal range within the last 12 months    Cholesterol, Total  Date Value Ref Range Status  10/21/2021 125 100 - 199 mg/dL Final   LDL Chol Calc (NIH)  Date Value Ref Range Status  10/21/2021 69 0 - 99 mg/dL Final   HDL  Date Value Ref Range Status  10/21/2021 25 (L) >39 mg/dL Final   Triglycerides  Date Value Ref Range Status  10/21/2021 183 (H) 0 - 149 mg/dL Final         Passed - Patient is not pregnant      Passed - Valid encounter within last 12 months    Recent Outpatient Visits          1 month ago Type 2 diabetes mellitus without complication, without long-term current use of insulin ([place])   [place] [facility] Juluis Mire P, NP   3 months ago Dysuria   [place] Juluis Mire P, NP   8 months ago Type 2 diabetes mellitus without complication, without long-term current use of insulin ([place])   [place] Juluis Mire P, NP   1 year ago Type 2 diabetes mellitus without complication, without long-term current use of insulin ([place])   [place] [facility] Kerin Perna, NP   1 year ago Cervical cancer screening   [place], [place], NP      Future Appointments            In 1 month Oletta Lamas, Mil[place] Cage, NP [place]           . hydrochlorothiazide (HYDRODIURIL) 25 MG tablet 90 tablet 0    Sig: Take 1 tablet (25 mg total) by mouth daily.     Cardiovascular: Diuretics - Thiazide Failed - 06/20/2022 10:03 AM      Failed - Cr in normal range and within 180 days    Creatinine, Ser  Date Value Ref Range Status  10/21/2021 0.70 0.57 - 1.00 mg/dL Final          Failed - K in normal range and within 180 days    Potassium  Date Value Ref Range Status  10/21/2021 4.1 3.5 - 5.2 mmol/L Final         Failed - Na in normal range and within 180 days    Sodium  Date Value Ref Range Status  10/21/2021 138 134 - 144 mmol/L Final         Passed - Last BP in normal range    BP Readings from Last 1 Encounters:  05/01/22 133/77         Passed - Valid encounter within last 6 months    Recent Outpatient Visits          1 month ago Type 2 diabetes mellitus without complication, without long-term current use of insulin ([place])   [place] [facility] Kerin Perna, NP   3 months ago Dysuria   [place] Kerin Perna, NP   8 months ago Type 2 diabetes mellitus without complication, without long-term current use of insulin ([place])  [facility] Kerin Perna, NP   1 year ago Type 2 diabetes mellitus without complication, without long-term current use of insulin ([place])   [place] [facility] Kerin Perna, NP   1 year ago Cervical cancer screening   [place], [place], NP      Future Appointments            In 1 month Oletta Lamas, Milford Cage, NP [place]           . lisinopril (ZESTRIL) 20 MG tablet 90 tablet 0    Sig: TAKE 1 TABLET (20 MG TOTAL) BY MOUTH DAILY.     Cardiovascular:  ACE Inhibitors Failed - 06/20/2022 10:03 AM      Failed - Cr in normal range and within 180 days    Creatinine, Ser  Date Value Ref Range Status  10/21/2021 0.70 0.57 - 1.00 mg/dL Final         Failed - K in normal range and within 180 days    Potassium  Date Value Ref Range Status  10/21/2021 4.1 3.5 - 5.2 mmol/L Final         Passed - Patient is not pregnant      Passed - Last BP in normal range    BP Readings from Last 1 Encounters:  05/01/22 133/77         Passed - Valid encounter within last  6 months    Recent Outpatient Visits          1 month ago Type 2 diabetes mellitus without complication, without long-term current use of insulin ([place])   [place] [facility] Kerin Perna, NP   3 months ago Dysuria   [place] Juluis Mire P, NP   8 months ago Type 2 diabetes mellitus without complication, without long-term current use of insulin ([place])   [place] Juluis Mire P, NP   1 year ago Type 2 diabetes mellitus without complication, without long-term current use of insulin ([place])   [place] [facility] Kerin Perna, NP   1 year ago Cervical cancer screening   [place], [place], NP      Future Appointments            In 1 month Oletta Lamas, Milford Cage, NP [place]           . metFORMIN (GLUCOPHAGE-XR) 500 MG 24 hr tablet 180 tablet 0    Sig: Take 1 tablet (500 mg total) by mouth 2 (two) times daily.     Endocrinology:  Diabetes - Biguanides Failed - 06/20/2022 10:03 AM      Failed - B12 Level in normal range and within 720 days    No results found for: "VITAMINB12"       Passed - Cr in normal range and within 360 days    Creatinine, Ser  Date Value Ref Range Status  10/21/2021 0.70 0.57 - 1.00 mg/dL Final         Passed - HBA1C is between 0 and 7.9 and within 180 days    Hemoglobin A1C  Date Value Ref Range Status  05/01/2022 7.1 (A) 4.0 - 5.6 % Final         Passed - eGFR in normal range and within 360 days    GFR calc Af Wyvonnia Lora  Date  Value Ref Range Status  10/16/2020 115 >59 mL/min/1.73 Final    Comment:    **In accordance with recommendations from the NKF-ASN Task force,**   [facility] is in the process of updating its eGFR calculation to the   2021 CKD-EPI creatinine equation that estimates kidney function   without a race variable.    GFR, Estimated  Date Value Ref Range Status  11/28/2020 >60  >60 mL/min Final    Comment:    (NOTE) Calculated using the CKD-EPI Creatinine Equation (2021)    eGFR  Date Value Ref Range Status  10/21/2021 108 >59 mL/min/1.73 Final         Passed - Valid encounter within last 6 months    Recent Outpatient Visits          1 month ago Type 2 diabetes mellitus without complication, without long-term current use of insulin ([place])   [place] Kerin Perna, NP   3 months ago Dysuria   [place] Juluis Mire P, NP   8 months ago Type 2 diabetes mellitus without complication, without long-term current use of insulin ([place])   [place] Kerin Perna, NP   1 year ago Type 2 diabetes mellitus without complication, without long-term current use of insulin ([place])   [place] [facility] Kerin Perna, NP   1 year ago Cervical cancer screening   [place], Michelle P, NP      Future Appointments            In 1 month [place], Milford Cage, NP [place]           Passed - CBC within normal limits and completed in the last 12 months    WBC  Date Value Ref Range Status  10/21/2021 9.4 3.4 - 10.8 x10E3/uL Final  11/28/2020 7.6 4.0 - 10.5 K/uL Final   RBC  Date Value Ref Range Status  10/21/2021 4.38 3.77 - 5.28 x10E6/uL Final  11/28/2020 4.27 3.87 - 5.11 MIL/uL Final   Hemoglobin  Date Value Ref Range Status  10/21/2021 10.3 (L) 11.1 - 15.9 g/dL Final   Hematocrit  Date Value Ref Range Status  10/21/2021 32.0 (L) 34.0 - 46.6 % Final   MCHC  Date Value Ref Range Status  10/21/2021 32.2 31.5 - 35.7 g/dL Final  11/28/2020 32.0 30.0 - 36.0 g/dL Final   [facility]  Date Value Ref Range Status  10/21/2021 23.5 (L) 26.6 - 33.0 pg Final  11/28/2020 24.4 (L) 26.0 - 34.0 pg Final   MCV  Date Value Ref Range Status  10/21/2021 73 (L) 79 - 97 fL Final   No results found for:  "PLTCOUNTKUC", "LABPLAT", "POCPLA" RDW  Date Value Ref Range Status  10/21/2021 14.8 11.7 - 15.4 % Final         . ibuprofen (ADVIL) 800 MG tablet 90 tablet 0    Sig: Take 1 tablet (800 mg total) by mouth every 8 (eight) hours as needed for moderate pain.     Analgesics:  NSAIDS Failed - 06/20/2022 10:03 AM      Failed - Manual Review: Labs are only required if the patient has taken medication for more than 8 weeks.      Failed - HGB in normal range and within 360 days    Hemoglobin  Date Value Ref Range Status  10/21/2021 10.3 (L) 11.1 - 15.9 g/dL Final  Failed - HCT in normal range and within 360 days    Hematocrit  Date Value Ref Range Status  10/21/2021 32.0 (L) 34.0 - 46.6 % Final         Passed - Cr in normal range and within 360 days    Creatinine, Ser  Date Value Ref Range Status  10/21/2021 0.70 0.57 - 1.00 mg/dL Final         Passed - PLT in normal range and within 360 days    Platelets  Date Value Ref Range Status  10/21/2021 273 150 - 450 x10E3/uL Final         Passed - eGFR is 30 or above and within 360 days    GFR calc Af Amer  Date Value Ref Range Status  10/16/2020 115 >59 mL/min/1.73 Final    Comment:    **In accordance with recommendations from the NKF-ASN Task force,**   [facility] is in the process of updating its eGFR calculation to the   2021 CKD-EPI creatinine equation that estimates kidney function   without a race variable.    GFR, Estimated  Date Value Ref Range Status  11/28/2020 >60 >60 mL/min Final    Comment:    (NOTE) Calculated using the CKD-EPI Creatinine Equation (2021)    eGFR  Date Value Ref Range Status  10/21/2021 108 >59 mL/min/1.73 Final         Passed - Patient is not pregnant      Passed - Valid encounter within last 12 months    Recent Outpatient Visits          1 month ago Type 2 diabetes mellitus without complication, without long-term current use of insulin ([place])   [place] [facility]  Juluis Mire P, NP   3 months ago Dysuria   [place] Juluis Mire P, NP   8 months ago Type 2 diabetes mellitus without complication, without long-term current use of insulin ([place])   [place] [facility] Juluis Mire P, NP   1 year ago Type 2 diabetes mellitus without complication, without long-term current use of insulin ([place])   [place] [facility] Kerin Perna, NP   1 year ago Cervical cancer screening   [place], [place], NP      Future Appointments            In 1 month Oletta Lamas, [place] Cage, NP [place]

## 2022-06-23 ENCOUNTER — Other Ambulatory Visit: Payer: Self-pay

## 2022-07-04 ENCOUNTER — Other Ambulatory Visit: Payer: Self-pay

## 2022-07-10 ENCOUNTER — Other Ambulatory Visit: Payer: Self-pay

## 2022-07-11 ENCOUNTER — Other Ambulatory Visit: Payer: Self-pay

## 2022-07-24 ENCOUNTER — Other Ambulatory Visit: Payer: Self-pay

## 2022-07-28 ENCOUNTER — Other Ambulatory Visit: Payer: Self-pay

## 2022-08-01 ENCOUNTER — Ambulatory Visit (INDEPENDENT_AMBULATORY_CARE_PROVIDER_SITE_OTHER): Payer: No Typology Code available for payment source | Admitting: Primary Care

## 2022-08-12 ENCOUNTER — Other Ambulatory Visit: Payer: Self-pay

## 2022-08-14 ENCOUNTER — Ambulatory Visit (INDEPENDENT_AMBULATORY_CARE_PROVIDER_SITE_OTHER): Payer: No Typology Code available for payment source | Admitting: Primary Care

## 2022-09-02 ENCOUNTER — Encounter (INDEPENDENT_AMBULATORY_CARE_PROVIDER_SITE_OTHER): Payer: Self-pay | Admitting: Primary Care

## 2022-09-02 ENCOUNTER — Other Ambulatory Visit: Payer: Self-pay

## 2022-09-02 ENCOUNTER — Ambulatory Visit (INDEPENDENT_AMBULATORY_CARE_PROVIDER_SITE_OTHER): Payer: Self-pay | Admitting: Primary Care

## 2022-09-02 VITALS — BP 135/79 | HR 90 | Resp 16 | Wt 145.6 lb

## 2022-09-02 DIAGNOSIS — I10 Essential (primary) hypertension: Secondary | ICD-10-CM

## 2022-09-02 DIAGNOSIS — Z760 Encounter for issue of repeat prescription: Secondary | ICD-10-CM

## 2022-09-02 DIAGNOSIS — Z1211 Encounter for screening for malignant neoplasm of colon: Secondary | ICD-10-CM

## 2022-09-02 DIAGNOSIS — E782 Mixed hyperlipidemia: Secondary | ICD-10-CM

## 2022-09-02 DIAGNOSIS — E119 Type 2 diabetes mellitus without complications: Secondary | ICD-10-CM

## 2022-09-02 LAB — GLUCOSE, POCT (MANUAL RESULT ENTRY): POC Glucose: 244 mg/dl — AB (ref 70–99)

## 2022-09-02 LAB — POCT GLYCOSYLATED HEMOGLOBIN (HGB A1C): HbA1c, POC (controlled diabetic range): 6.5 % (ref 0.0–7.0)

## 2022-09-02 MED ORDER — LISINOPRIL 20 MG PO TABS
20.0000 mg | ORAL_TABLET | Freq: Every day | ORAL | 0 refills | Status: DC
  Filled 2022-09-02: qty 90, fill #0
  Filled 2022-09-22 – 2022-12-02 (×3): qty 90, 90d supply, fill #0

## 2022-09-02 MED ORDER — ATORVASTATIN CALCIUM 20 MG PO TABS
20.0000 mg | ORAL_TABLET | Freq: Every day | ORAL | 0 refills | Status: DC
  Filled 2022-09-02: qty 30, 30d supply, fill #0
  Filled 2022-09-22: qty 30, 30d supply, fill #1
  Filled 2022-11-17 – 2022-12-02 (×2): qty 30, 30d supply, fill #2

## 2022-09-02 MED ORDER — METFORMIN HCL ER 500 MG PO TB24
500.0000 mg | ORAL_TABLET | Freq: Two times a day (BID) | ORAL | 0 refills | Status: DC
  Filled 2022-09-02 – 2022-09-22 (×2): qty 180, 90d supply, fill #0

## 2022-09-02 MED ORDER — HYDROCHLOROTHIAZIDE 25 MG PO TABS
25.0000 mg | ORAL_TABLET | Freq: Every day | ORAL | 0 refills | Status: DC
  Filled 2022-09-02 – 2022-09-22 (×2): qty 90, 90d supply, fill #0

## 2022-09-02 NOTE — Progress Notes (Signed)
Susan [name], is a 46 y.o. 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 She does c/o lower abdominal pain- unable to associate with foods, activity no burning with urination. Patient has No headache, No chest pain, No abdominal pain - No Nausea, No new weakness tingling or numbness, No Cough - shortness of breath  No problems updated.  No Known Allergies  Past Medical History:  Diagnosis Date   Diabetes 1.5, managed as type 2 ([place])    Hypertension     Current Outpatient Medications on File Prior to Visit  Medication Sig Dispense Refill   ibuprofen (ADVIL) 800 MG tablet Take 1 tablet (800 mg total) by mouth every 8 (eight) hours as needed for moderate pain. 90 tablet 0   No current facility-administered medications on file prior to visit.    Objective:   Vitals:   09/02/22 1013  BP: 135/79  Pulse: 90  Resp: 16  SpO2: 98%  Weight: 145 lb 9.6 oz (66 kg)    Exam General appearance : Awake, alert, not in any distress. Speech Clear. Not toxic looking HEENT: Atraumatic and Normocephalic, pupils equally reactive to light and accomodation Neck: Supple, no JVD. No cervical lymphadenopathy.  Chest: Good air entry bilaterally, no added sounds  CVS: S1 S2 regular, no murmurs.  Abdomen: Bowel sounds present, Non tender and not distended with no gaurding, rigidity or rebound. Extremities: B/L Lower Ext shows no edema, both legs are warm to touch Neurology: Awake alert, and oriented X 3, CN II-XII intact, Non focal Skin: No Rash  Data Review Lab Results  Component Value Date   HGBA1C 6.5  09/02/2022   HGBA1C 7.1 (A) 05/01/2022   HGBA1C 5.9 (A) 10/21/2021    Assessment & Plan   1. Type 2 diabetes mellitus without complication, without long-term current use of insulin (HCC) Continue to monitor foods that are high in carbohydrates are the following rice, potatoes, breads, sugars, and pastas.  Reduction in the intake (eating) will assist in lowering your blood sugars.  - POCT glucose (manual entry) - POCT glycosylated hemoglobin (Hb A1C) 6.5  - Microalbumin / creatinine urine ratio - metFORMIN (GLUCOPHAGE-XR) 500 MG 24 hr tablet; Take 1 tablet (500 mg total) by mouth 2 (two) times daily.  Dispense: 180 tablet; Refill: 0  2. Essential hypertension - lisinopril (ZESTRIL) 20 MG tablet; TAKE 1 TABLET (20 MG TOTAL) BY MOUTH DAILY.  Dispense: 90 tablet; Refill: 0 - hydrochlorothiazide (HYDRODIURIL) 25 MG tablet; Take 1 tablet (25 mg total) by mouth daily.  Dispense: 90 tablet; Refill: 0 - metFORMIN (GLUCOPHAGE-XR) 500 MG 24 hr tablet; Take 1 tablet (500 mg total) by mouth 2 (two) times daily.  Dispense: 180 tablet; Refill: 0  3. Medication refill  lisinopril (ZESTRIL) 20 MG tablet; TAKE 1 TABLET (20 MG TOTAL) BY MOUTH DAILY.  Dispense: 90 tablet; Refill: 0 - hydrochlorothiazide (HYDRODIURIL) 25 MG tablet; Take 1 tablet (25 mg total) by mouth daily.  Dispense: 90 tablet; Refill: 0 - metFORMIN (GLUCOPHAGE-XR) 500 MG 24 hr tablet; Take 1 tablet (500 mg total) by mouth 2 (  two) times daily.  Dispense: 180 tablet; Refill: 0 - atorvastatin (LIPITOR) 20 MG tablet; Take 1 tablet (20 mg total) by mouth daily.  Dispense: 90 tablet; Refill: 0  4. Mixed hyperlipidemia - atorvastatin (LIPITOR) 20 MG tablet; Take 1 tablet (20 mg total) by mouth daily.  Dispense: 90 tablet; Refill: 0  5. Colon cancer screening FOBT    Patient have been counseled extensively about nutrition and exercise. Other issues discussed during this visit include: low cholesterol diet, weight control and daily exercise,  foot care, annual eye examinations at Ophthalmology, importance of adherence with medications and regular follow-up. We also discussed long term complications of uncontrolled diabetes and hypertension.   Return in about 3 months (around 12/02/2022) for DM.  The patient was given clear instructions to go to ER or return to medical center if symptoms don't improve, worsen or new problems develop. The patient verbalized understanding. The patient was told to call to get lab results if they haven't heard anything in the next week.   This note has been created with Surveyor, quantity. Any transcriptional errors are unintentional.   Kerin Perna, NP 09/02/2022, 3:03 PM

## 2022-09-03 ENCOUNTER — Other Ambulatory Visit: Payer: Self-pay

## 2022-09-22 ENCOUNTER — Other Ambulatory Visit: Payer: Self-pay

## 2022-11-17 ENCOUNTER — Other Ambulatory Visit: Payer: Self-pay

## 2022-11-17 ENCOUNTER — Other Ambulatory Visit (INDEPENDENT_AMBULATORY_CARE_PROVIDER_SITE_OTHER): Payer: Self-pay | Admitting: Primary Care

## 2022-11-17 DIAGNOSIS — I10 Essential (primary) hypertension: Secondary | ICD-10-CM

## 2022-11-17 DIAGNOSIS — Z760 Encounter for issue of repeat prescription: Secondary | ICD-10-CM

## 2022-11-17 DIAGNOSIS — E119 Type 2 diabetes mellitus without complications: Secondary | ICD-10-CM

## 2022-11-17 MED ORDER — HYDROCHLOROTHIAZIDE 25 MG PO TABS
25.0000 mg | ORAL_TABLET | Freq: Every day | ORAL | 0 refills | Status: DC
  Filled 2022-11-17 – 2022-12-02 (×2): qty 90, 90d supply, fill #0

## 2022-11-17 MED ORDER — METFORMIN HCL ER 500 MG PO TB24
500.0000 mg | ORAL_TABLET | Freq: Two times a day (BID) | ORAL | 0 refills | Status: DC
  Filled 2022-11-17 – 2022-12-02 (×2): qty 180, 90d supply, fill #0

## 2022-12-02 ENCOUNTER — Ambulatory Visit (INDEPENDENT_AMBULATORY_CARE_PROVIDER_SITE_OTHER): Payer: No Typology Code available for payment source | Admitting: Primary Care

## 2022-12-02 ENCOUNTER — Other Ambulatory Visit: Payer: Self-pay

## 2022-12-03 ENCOUNTER — Ambulatory Visit (INDEPENDENT_AMBULATORY_CARE_PROVIDER_SITE_OTHER): Payer: No Typology Code available for payment source | Admitting: Primary Care

## 2022-12-16 ENCOUNTER — Ambulatory Visit (INDEPENDENT_AMBULATORY_CARE_PROVIDER_SITE_OTHER): Payer: No Typology Code available for payment source | Admitting: Primary Care

## 2023-01-06 ENCOUNTER — Other Ambulatory Visit: Payer: Self-pay

## 2023-01-06 ENCOUNTER — Ambulatory Visit (INDEPENDENT_AMBULATORY_CARE_PROVIDER_SITE_OTHER): Payer: Self-pay | Admitting: Primary Care

## 2023-01-06 ENCOUNTER — Encounter (INDEPENDENT_AMBULATORY_CARE_PROVIDER_SITE_OTHER): Payer: Self-pay | Admitting: Primary Care

## 2023-01-06 VITALS — BP 131/82 | HR 94 | Resp 16 | Ht <= 58 in | Wt 142.0 lb

## 2023-01-06 DIAGNOSIS — E119 Type 2 diabetes mellitus without complications: Secondary | ICD-10-CM

## 2023-01-06 DIAGNOSIS — R0989 Other specified symptoms and signs involving the circulatory and respiratory systems: Secondary | ICD-10-CM

## 2023-01-06 DIAGNOSIS — Z6830 Body mass index (BMI) 30.0-30.9, adult: Secondary | ICD-10-CM

## 2023-01-06 DIAGNOSIS — Z760 Encounter for issue of repeat prescription: Secondary | ICD-10-CM

## 2023-01-06 DIAGNOSIS — E6609 Other obesity due to excess calories: Secondary | ICD-10-CM

## 2023-01-06 DIAGNOSIS — I10 Essential (primary) hypertension: Secondary | ICD-10-CM

## 2023-01-06 DIAGNOSIS — E782 Mixed hyperlipidemia: Secondary | ICD-10-CM

## 2023-01-06 DIAGNOSIS — H524 Presbyopia: Secondary | ICD-10-CM

## 2023-01-06 DIAGNOSIS — Z1159 Encounter for screening for other viral diseases: Secondary | ICD-10-CM

## 2023-01-06 DIAGNOSIS — Z1211 Encounter for screening for malignant neoplasm of colon: Secondary | ICD-10-CM

## 2023-01-06 DIAGNOSIS — H539 Unspecified visual disturbance: Secondary | ICD-10-CM

## 2023-01-06 MED ORDER — HYDROCHLOROTHIAZIDE 25 MG PO TABS
25.0000 mg | ORAL_TABLET | Freq: Every day | ORAL | 1 refills | Status: DC
  Filled 2023-01-06 – 2023-02-16 (×2): qty 90, 90d supply, fill #0
  Filled 2023-05-07: qty 90, 90d supply, fill #1

## 2023-01-06 MED ORDER — IBUPROFEN 800 MG PO TABS
800.0000 mg | ORAL_TABLET | Freq: Three times a day (TID) | ORAL | 0 refills | Status: DC | PRN
  Filled 2023-01-06: qty 90, 30d supply, fill #0

## 2023-01-06 MED ORDER — METFORMIN HCL ER 500 MG PO TB24
500.0000 mg | ORAL_TABLET | Freq: Two times a day (BID) | ORAL | 1 refills | Status: DC
  Filled 2023-01-06 – 2023-02-16 (×2): qty 180, 90d supply, fill #0
  Filled 2023-05-07: qty 180, 90d supply, fill #1

## 2023-01-06 MED ORDER — FLUTICASONE PROPIONATE 50 MCG/ACT NA SUSP
2.0000 | Freq: Every day | NASAL | 6 refills | Status: DC
  Filled 2023-01-06: qty 16, 30d supply, fill #0
  Filled 2023-05-07: qty 16, 30d supply, fill #1

## 2023-01-06 MED ORDER — LISINOPRIL 20 MG PO TABS
20.0000 mg | ORAL_TABLET | Freq: Every day | ORAL | 1 refills | Status: DC
  Filled 2023-01-06 – 2023-02-16 (×2): qty 90, 90d supply, fill #0
  Filled 2023-05-07: qty 90, 90d supply, fill #1

## 2023-01-06 MED ORDER — GUAIFENESIN 200 MG PO TABS
200.0000 mg | ORAL_TABLET | ORAL | 0 refills | Status: DC | PRN
  Filled 2023-01-06: qty 30, 5d supply, fill #0

## 2023-01-06 MED ORDER — ATORVASTATIN CALCIUM 20 MG PO TABS
20.0000 mg | ORAL_TABLET | Freq: Every day | ORAL | 1 refills | Status: DC
  Filled 2023-01-06: qty 90, 90d supply, fill #0
  Filled 2023-05-07: qty 90, 90d supply, fill #1

## 2023-01-06 NOTE — Patient Instructions (Addendum)
Recuento de caloras para bajar de peso Calorie Counting for Weight Loss Las caloras son unidades de energa. El cuerpo necesita una cierta cantidad de caloras de los alimentos para que lo ayuden a funcionar durante todo el da. Cuando se comen o beben ms caloras de las que el cuerpo necesita, este acumula las caloras adicionales mayormente como grasa. Cuando se comen o beben menos caloras de las que el cuerpo necesita, este quema grasa para obtener la energa que necesita. El recuento de caloras es el registro de la cantidad de caloras que se comen y beben cada da. El recuento de caloras puede ser de ayuda si necesita perder peso. Si come menos caloras de las que el cuerpo necesita, debera bajar de peso. Pregntele al mdico cul es un peso sano para usted. Para que el recuento de caloras funcione, usted tendr que ingerir la cantidad de caloras adecuadas cada da, para bajar una cantidad de peso saludable por semana. Un nutricionista puede ayudar a determinar la cantidad de caloras que usted necesita por da y sugerirle formas de alcanzar su objetivo calrico. Una cantidad de peso saludable para bajar cada semana suele ser entre 1 y 2 libras (0.5 a 0.9 kg). Esto habitualmente significa que su ingesta diaria de caloras se debera reducir en unas 500 a 750 caloras. Ingerir de 1200 a 1500 caloras por da puede ayudar a la mayora de las mujeres a bajar de peso. Ingerir de 1500 a 1800 caloras por da puede ayudar a la mayora de los hombres a bajar de peso. Qu debo saber acerca del recuento de caloras? Trabaje con el mdico o el nutricionista para determinar cuntas caloras debe recibir cada da. A fin de alcanzar su objetivo diario de caloras, tendr que: Averiguar cuntas caloras hay en cada alimento que le gustara comer. Intente hacerlo antes de comer. Decidir la cantidad que puede comer del alimento. Llevar un registro de los alimentos. Para esto, anote lo que comi y cuntas  caloras tena. Para perder peso con xito, es importante equilibrar el recuento de caloras con un estilo de vida saludable que incluya actividad fsica de forma regular. Dnde encuentro informacin sobre las caloras?  Es posible encontrar la cantidad de caloras que contiene un alimento en la etiqueta de informacin nutricional. Si un alimento no tiene una etiqueta de informacin nutricional, intente buscar las caloras en Internet o pida ayuda al nutricionista. Recuerde que las caloras se calculan por porcin. Si opta por comer ms de una porcin de un alimento, tendr que multiplicar las caloras de una porcin por la cantidad de porciones que planea comer. Por ejemplo, la etiqueta de un envase de pan puede decir que el tamao de una porcin es 1 rodaja, y que una porcin tiene 90 caloras. Si come 1 rodaja, habr comido 90 caloras. Si come 2 rodajas, habr comido 180 caloras. Cmo llevo un registro de comidas? Despus de cada vez que coma, anote lo siguiente en el registro de alimentos lo antes posible: Lo que comi. Asegrese de incluir los aderezos, las salsas y otros extras en los alimentos. La cantidad que comi. Esto se puede medir en tazas, onzas o cantidad de alimentos. Cuntas caloras haba en cada alimento y en cada bebida. La cantidad total de caloras en la comida que tom. Tenga a mano el registro de alimentos, por ejemplo, en un anotador de bolsillo o utilice una aplicacin o sitio web en el telfono mvil. Algunos programas calcularn las caloras por usted y le mostrarn la cantidad de   caloras que le quedan para llegar al objetivo diario. Cules son algunos consejos para controlar las porciones? Sepa cuntas caloras hay en una porcin. Esto lo ayudar a saber cuntas porciones de un alimento determinado puede comer. Use una taza medidora para medir los tamaos de las porciones. Tambin puede intentar pesar las porciones en una balanza de cocina. Con el tiempo, podr hacer  un clculo estimativo de los tamaos de las porciones de algunos alimentos. Dedique tiempo a poner porciones de diferentes alimentos en sus platos, tazones y tazas predilectos, a fin de saber cmo se ve una porcin. Intente no comer directamente de un envase de alimentos, por ejemplo, de una bolsa o una caja. Comer directamente del envase dificulta ver cunto est comiendo y puede conducir a comer en exceso. Ponga la cantidad que le gustara comer en una taza o un plato, a fin de asegurarse de que est comiendo la porcin correcta. Use platos, vasos y tazones ms pequeos para medir porciones ms pequeas y evitar no comer en exceso. Intente no realizar varias tareas al mismo tiempo. Por ejemplo, evite mirar televisin o usar la computadora mientras come. Si es la hora de comer, sintese a la mesa y disfrute de la comida. Esto lo ayudar a reconocer cundo est satisfecho. Tambin le permitir estar ms consciente de qu come y cunto come. Consejos para seguir este plan Al leer las etiquetas de los alimentos Controle el recuento de caloras en comparacin con el tamao de la porcin. El tamao de la porcin puede ser ms pequeo de lo que suele comer. Verifique la fuente de las caloras. Intente elegir alimentos ricos en protenas, fibras y vitaminas, y bajos en grasas saturadas, grasas trans y sodio. Al ir de compras Lea las etiquetas nutricionales cuando compre. Esto lo ayudar a tomar decisiones saludables sobre qu alimentos comprar. Preste atencin a las etiquetas nutricionales de alimentos bajos en grasas o sin grasas. Estos alimentos a veces tienen la misma cantidad de caloras o ms caloras que las versiones ricas en grasas. Con frecuencia, tambin tienen agregados de azcar, almidn o sal, para darles el sabor que fue eliminado con las grasas. Haga una lista de compras con los alimentos que tienen un menor contenido de caloras y resptela. Al cocinar Intente cocinar sus alimentos preferidos  de una manera ms saludable. Por ejemplo, pruebe hornear en vez de frer. Utilice productos lcteos descremados. Planificacin de las comidas Utilice ms frutas y verduras. La mitad de su plato debe ser de frutas y verduras. Incluya protenas magras, como pollo, pavo y pescado. Estilo de vida Cada semana, trate de hacer una de las siguientes cosas: 150 minutos de ejercicio moderado, como caminar. 75 minutos de ejercicio enrgico, como correr. Informacin general Sepa cuntas caloras tienen los alimentos que come con ms frecuencia. Esto le ayudar a contar las caloras ms rpidamente. Encuentre un mtodo para controlar las caloras que funcione para usted. Sea creativo. Pruebe aplicaciones o programas distintos, si llevar un registro de las caloras no funciona para usted. Qu alimentos debo consumir?  Consuma alimentos nutritivos. Es mejor comer un alimento nutritivo, de alto contenido calrico, como un aguacate, que uno con pocos nutrientes, como una bolsa de patatas fritas. Use sus caloras en alimentos y bebidas que lo sacien y no lo dejen con apetito apenas termina de comer. Ejemplos de alimentos que lo sacian son los frutos secos y mantequillas de frutos secos, verduras, protenas magras y alimentos con alto contenido de fibra como los cereales integrales. Los alimentos con alto   contenido de fibra son aquellos que tienen ms de 5 g de fibra por porcin. Preste atencin a las caloras en las bebidas. Las bebidas de bajas caloras incluyen agua y refrescos sin azcar. Es posible que los productos que se enumeran ms arriba no constituyan una lista completa de los alimentos y las bebidas que puede tomar. Consulte a un nutricionista para obtener ms informacin. Qu alimentos debo limitar? Limite el consumo de alimentos o bebidas que no sean buenas fuentes de vitaminas, minerales o protenas, o que tengan alto contenido de grasas no saludables. Estos incluyen: Caramelos. Otros  dulces. Refrescos, bebidas con caf especiales, alcohol y jugo. Es posible que los productos que se enumeran ms arriba no constituyan una lista completa de los alimentos y las bebidas que debe evitar. Consulte a un nutricionista para obtener ms informacin. Cmo puedo hacer el recuento de caloras cuando como afuera? Preste atencin a las porciones. A menudo, las porciones son mucho ms grandes al comer afuera. Pruebe con estos consejos para mantener las porciones ms pequeas: Considere la posibilidad de compartir una comida en lugar de tomarla toda usted solo. Si pide su propia comida, coma solo la mitad. Antes de empezar a comer, pida un recipiente y ponga la mitad de la comida en l. Cuando sea posible, considere la posibilidad de pedir porciones ms pequeas del men en lugar de porciones completas. Preste atencin a la eleccin de alimentos y bebidas. Saber la forma en que se cocinan los alimentos y lo que incluye la comida puede ayudarlo a ingerir menos caloras. Si se detallan las caloras en el men, elija las opciones que contengan la menor cantidad. Elija platos que incluyan verduras, frutas, cereales integrales, productos lcteos con bajo contenido de grasa y protenas magras. Opte por los alimentos hervidos, asados, cocidos a la parrilla o al vapor. Evite los alimentos a los que se les ponga mantequilla, que estn empanados o fritos, o que se sirvan con salsa a base de crema. Generalmente, los alimentos que se etiquetan como "crujientes" estn fritos, a menos que se indique lo contrario. Elija el agua, la leche descremada, el t helado sin azcar u otras bebidas que no contengan azcares agregados. Si desea una bebida alcohlica, escoja una opcin con menos caloras, como una copa de vino o una cerveza ligera. Ordene los aderezos, las salsas y los jarabes aparte. Estos son, con frecuencia, de alto contenido en caloras, por lo que debe limitar la cantidad que ingiere. Si desea una  ensalada, elija una de hortalizas y pida carnes a la parrilla. Evite las guarniciones adicionales como el tocino, el queso o los alimentos fritos. Ordene el aderezo aparte o pida aceite de oliva y vinagre o limn para aderezar. Haga un clculo estimativo de la cantidad de porciones que le sirven. Conocer el tamao de las porciones lo ayudar a estar atento a la cantidad de comida que come en los restaurantes. Dnde buscar ms informacin Centers for Disease Control and Prevention (Centros para el Control y la Prevencin de Enfermedades): www.cdc.gov U.S. Department of Agriculture (Departamento de Agricultura de los EE. UU.): myplate.gov Resumen El recuento de caloras es el registro de la cantidad de caloras que se comen y beben cada da. Si come menos caloras de las que el cuerpo necesita, debera bajar de peso. Una cantidad de peso saludable para bajar por semana suele ser entre 1 y 2 libras (0.5 a 0.9 kg). Esto significa, con frecuencia, reducir su ingesta diaria de caloras unas 500 a 750 caloras. Es posible   encontrar la cantidad de caloras que contiene un alimento en la etiqueta de informacin nutricional. Si un alimento no tiene una etiqueta de informacin nutricional, intente buscar las caloras en Internet o pida ayuda al nutricionista. Use platos, vasos y tazones ms pequeos para medir porciones ms pequeas y Product/process development scientist no comer en exceso. Use sus caloras en alimentos y bebidas que lo sacien y no lo dejen con apetito poco tiempo despus de haber comido. Esta informacin no tiene Marine scientist el consejo del mdico. Asegrese de hacerle al mdico cualquier pregunta que tenga. Document Revised: 03/19/2020 Document Reviewed: 03/19/2020 Elsevier Patient Education  [place] Presbyopia  Cuando una persona tiene presbicia, sus ojos tienen dificultad para hacer foco en los objetos que estn cerca, o no pueden enfocar los objetos que estn cerca. La capacidad de los  ojos de hacer foco a distancia se llama acomodacin. La prdida de la acomodacin es parte del proceso normal de envejecimiento. Generalmente, la presbicia se hace evidente alrededor de los 36 aos y [place] (progresa) con la edad. Cules son las causas? Esta afeccin es causada por la prdida de flexibilidad de la lente natural del ojo. Esto ocurre generalmente con la edad. Cules son los signos o sntomas? El sntoma principal de esta afeccin es la dificultad para leer o ver objetos cercanos a la cara. En la primera etapa de la presbicia, sostener los [employer] un poco ms alejados de los ojos ayuda a ver con ms claridad. Con el tiempo, el hecho de [employer] cosas ms lejos no es de ayuda suficiente. Otros sntomas pueden incluir: Dolores de [place]. Fatiga o dolor en los ojos al leer o enfocar de cerca. Cmo se diagnostica? Esta afeccin se diagnostica en funcin de los antecedentes mdicos y de un examen ocular realizado por un especialista en la salud de los ojos (optometrista u oftalmlogo). Se le har un examen de la vista en el cual se le pondr una serie de lentes delante de los ojos y deber leer una tabla optomtrica. Cmo se trata? El tratamiento puede implicar el uso de anteojos o lentes de Diplomatic Services operational officer, o Charity fundraiser la graduacin si ya los [place]. Es posible que necesite lentes bifocales (con una lnea en la lente de los anteojos), anteojos progresivos ("bifocales combinadas" sin lnea en la lente) o lentes de contacto bifocales. En algunos casos, pueden hacerle una ciruga para cambiar la forma de la crnea (ciruga refractiva) para permitirle ver de cerca con ms claridad. Siga estas instrucciones en su casa: Si le recetan anteojos o lentes de contacto, selos como se lo haya indicado el mdico. Los ojos pueden tardar [employer] 2 semanas en adaptarse a los anteojos con nueva graduacin. No conduzca ni use maquinaria si tiene la visin borrosa. Es posible que deba usar anteojos o lentes de  contacto recetados para corregir la visin siempre que conduzca o use maquinaria. Hable con el mdico sobre cualquier inquietud que tenga. Use los medicamentos de venta libre y los recetados solamente como se lo haya indicado el mdico. Esto incluye el uso de gotas para los ojos. Hgase un examen ocular al menos una vez por ao. Cumpla con todas las visitas de seguimiento. Esto es importante. Comunquese con un mdico si: Sus sntomas empeoran. Tiene nuevos sntomas. Sus anteojos o lentes de contacto no son de [place], o usted sigue sin ver mejor cuando los lleva puestos. Solicite ayuda de inmediato si: Presenta enrojecimiento, dolor o hinchazn intensos alrededor del ojo. Tiene cambios en la visin o problemas  para ver. Resumen La presbicia se presenta cuando los ojos tienen problemas para hacer foco en los objetos que estn cerca. Suele deberse a Scientist, product/process development. Si no se trata, la presbicia puede causar fatiga o dolor en los ojos al leer o enfocar de cerca. El tratamiento puede incluir el uso de anteojos o lentes de [place] o, en casos muy poco frecuentes, Clementeen Hoof. Esta informacin no tiene Marine scientist el consejo del mdico. Asegrese de hacerle al mdico cualquier pregunta que tenga. Document Revised: 06/26/2020 Document Reviewed: 06/26/2020 Elsevier Patient Education  [place].

## 2023-01-06 NOTE — Progress Notes (Signed)
[name], is a 47 y.o. female  DJT:[id number]  [id number]  DOB - Sep 03, 1976  Chief Complaint  Patient presents with   Diabetes   Nasal Congestion       Subjective:   [name] is a 47 y.o. female here today for a follow up visit for HTN . Patient has No headache, No chest pain, No abdominal pain - No Nausea, No new weakness tingling or numbness, No Cough - shortness of breath. Also DM denies polyuria , poly dipsia , polyphagia but has vision changes. She has never had an eye exam . Explain after age of 45 we develop presbyopia.   No problems updated.  No Known Allergies  Past Medical History:  Diagnosis Date   Diabetes 1.5, managed as type 2 ([place])    Hypertension     Current Outpatient Medications on File Prior to Visit  Medication Sig Dispense Refill   atorvastatin (LIPITOR) 20 MG tablet Take 1 tablet (20 mg total) by mouth daily. 90 tablet 0   hydrochlorothiazide (HYDRODIURIL) 25 MG tablet Take 1 tablet (25 mg total) by mouth daily. 90 tablet 0   ibuprofen (ADVIL) 800 MG tablet Take 1 tablet (800 mg total) by mouth every 8 (eight) hours as needed for moderate pain. 90 tablet 0   lisinopril (ZESTRIL) 20 MG tablet Take 1 tablet (20 mg total) by mouth daily. 90 tablet 0   metFORMIN (GLUCOPHAGE-XR) 500 MG 24 hr tablet Take 1 tablet (500 mg total) by mouth 2 (two) times daily. 180 tablet 0   No current facility-administered medications on file prior to visit.    Objective:   Vitals:   01/06/23 0942  BP: 131/82  Pulse: 94  Resp: 16  SpO2: 99%  Weight: 142 lb (64.4 kg)  Height: 4' 9.5" (1.461 m)    Exam General appearance : Awake, alert, not in any distress. Speech Clear. Not toxic looking HEENT: Atraumatic and Normocephalic, pupils equally reactive to light and accomodation( right nare red boggy) Neck: Supple, no JVD. No cervical lymphadenopathy.  Chest: Good air entry bilaterally, no added sounds  CVS: S1  S2 regular, no murmurs.  Abdomen: Bowel sounds present, Non tender and not distended with no gaurding, rigidity or rebound. Extremities: B/L Lower Ext shows no edema, both legs are warm to touch Neurology: Awake alert, and oriented X 3, CN II-XII intact, Non focal Skin: No Rash  Data Review Lab Results  Component Value Date   HGBA1C 6.5 09/02/2022   HGBA1C 7.1 (A) 05/01/2022   HGBA1C 5.9 (A) 10/21/2021    Assessment & Plan  [name] was seen today for diabetes and nasal congestion.  Diagnoses and all orders for this visit:  Type 2 diabetes mellitus without complication, without long-term current use of insulin ([place]) - educated on lifestyle modifications, including but not limited to diet choices and adding exercise to daily routine.   -     CBC with Differential/Platelet -     Hemoglobin A1c  Mixed hyperlipidemia  Healthy lifestyle diet of fruits vegetables fish nuts whole grains and low saturated fat . Foods high in cholesterol or liver, fatty meats,cheese, butter avocados, nuts and seeds, chocolate and fried foods. -     Lipid panel  Essential hypertension BP goal - < 130/80 Explained that having normal blood pressure is the goal and medications are helping to get to goal and maintain normal blood pressure. DIET: Limit salt intake, read nutrition labels to check salt content, limit  fried and high fatty foods  Avoid using multisymptom OTC cold preparations that generally contain sudafed which can rise BP. Consult with pharmacist on best cold relief products to use for persons with HTN EXERCISE Discussed incorporating exercise such as walking - 30 minutes most days of the week and can do in 10 minute intervals    -     CMP14+EGFR  Encounter for HCV screening test for low risk patient -     HCV Ab w Reflex to Quant PCR  Colon cancer screening -     Fecal occult blood, imunochemical; Future  Encounter for HCV screening test for low risk patient -     HCV Ab w Reflex to Quant  PCR  Colon cancer screening -     Fecal occult blood, imunochemical; Future  Medication refill -     atorvastatin (LIPITOR) 20 MG tablet; Take 1 tablet (20 mg total) by mouth daily. -     lisinopril (ZESTRIL) 20 MG tablet; Take 1 tablet (20 mg total) by mouth daily. -     hydrochlorothiazide (HYDRODIURIL) 25 MG tablet; Take 1 tablet (25 mg total) by mouth daily. -     metFORMIN (GLUCOPHAGE-XR) 500 MG 24 hr tablet; Take 1 tablet (500 mg total) by mouth 2 (two) times daily. -     ibuprofen (ADVIL) 800 MG tablet; Take 1 tablet (800 mg total) by mouth every 8 (eight) hours as needed for moderate pain.  Respiratory symptoms Cold cough, fever/chills 3 days , chills  -     COVID-19, Flu A+B and RSV   Class 1 obesity due to excess calories without serious comorbidity with body mass index (BMI) of 30.0 to 30.9 in adult  Obesity is 30-39 indicating an excess in caloric intake or underlining conditions. This may lead to other co-morbidities. Educated on lifestyle modifications of diet and exercise which may reduce obesity.    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 Other issues discussed during this visit include: low cholesterol diet, weight control and daily exercise, foot care, annual eye examinations at Ophthalmology, importance of adherence with medications and regular follow-up. We also discussed long term complications of uncontrolled diabetes and hypertension.   Return in about 3 months (around 04/07/2023).  The patient was given clear instructions to go to ER or return to medical center if symptoms don't improve, worsen or new problems develop. The patient verbalized understanding. The patient was told to call to get lab results if they haven't heard anything in the next week.   This note has been created with Surveyor, quantity. Any  transcriptional errors are unintentional.   Susan Perna, NP 01/06/2023, 10:04 AM

## 2023-01-07 LAB — HEMOGLOBIN A1C
Est. average glucose Bld gHb Est-mCnc: 137 mg/dL
Hgb A1c MFr Bld: 6.4 % — ABNORMAL HIGH (ref 4.8–5.6)

## 2023-01-07 LAB — CBC WITH DIFFERENTIAL/PLATELET
Basophils Absolute: 0 10*3/uL (ref 0.0–0.2)
Basos: 0 %
EOS (ABSOLUTE): 0 10*3/uL (ref 0.0–0.4)
Eos: 0 %
Hematocrit: 38.8 % (ref 34.0–46.6)
Hemoglobin: 13.1 g/dL (ref 11.1–15.9)
Immature Grans (Abs): 0 10*3/uL (ref 0.0–0.1)
Immature Granulocytes: 0 %
Lymphocytes Absolute: 2.8 10*3/uL (ref 0.7–3.1)
Lymphs: 31 %
MCH: 26.9 pg (ref 26.6–33.0)
MCHC: 33.8 g/dL (ref 31.5–35.7)
MCV: 80 fL (ref 79–97)
Monocytes Absolute: 0.8 10*3/uL (ref 0.1–0.9)
Monocytes: 8 %
Neutrophils Absolute: 5.4 10*3/uL (ref 1.4–7.0)
Neutrophils: 61 %
Platelets: 229 10*3/uL (ref 150–450)
RBC: 4.87 x10E6/uL (ref 3.77–5.28)
RDW: 12.3 % (ref 11.7–15.4)
WBC: 9 10*3/uL (ref 3.4–10.8)

## 2023-01-07 LAB — CMP14+EGFR
ALT: 56 IU/L — ABNORMAL HIGH (ref 0–32)
AST: 32 IU/L (ref 0–40)
Albumin/Globulin Ratio: 1.1 — ABNORMAL LOW (ref 1.2–2.2)
Albumin: 4.1 g/dL (ref 3.9–4.9)
Alkaline Phosphatase: 108 IU/L (ref 44–121)
BUN/Creatinine Ratio: 25 — ABNORMAL HIGH (ref 9–23)
BUN: 14 mg/dL (ref 6–24)
Bilirubin Total: 0.3 mg/dL (ref 0.0–1.2)
CO2: 20 mmol/L (ref 20–29)
Calcium: 10 mg/dL (ref 8.7–10.2)
Chloride: 102 mmol/L (ref 96–106)
Creatinine, Ser: 0.56 mg/dL — ABNORMAL LOW (ref 0.57–1.00)
Globulin, Total: 3.6 g/dL (ref 1.5–4.5)
Glucose: 128 mg/dL — ABNORMAL HIGH (ref 70–99)
Potassium: 4.5 mmol/L (ref 3.5–5.2)
Sodium: 138 mmol/L (ref 134–144)
Total Protein: 7.7 g/dL (ref 6.0–8.5)
eGFR: 113 mL/min/{1.73_m2} (ref >59)

## 2023-01-07 LAB — HCV INTERPRETATION

## 2023-01-07 LAB — HCV AB W REFLEX TO QUANT PCR: HCV Ab: NONREACTIVE

## 2023-01-07 LAB — LIPID PANEL
Chol/HDL Ratio: 3.7 ratio (ref 0.0–4.4)
Cholesterol, Total: 145 mg/dL (ref 100–199)
HDL: 39 mg/dL — ABNORMAL LOW (ref >39)
LDL Chol Calc (NIH): 83 mg/dL (ref 0–99)
Triglycerides: 129 mg/dL (ref 0–149)
VLDL Cholesterol Cal: 23 mg/dL (ref 5–40)

## 2023-01-08 ENCOUNTER — Ambulatory Visit (INDEPENDENT_AMBULATORY_CARE_PROVIDER_SITE_OTHER): Payer: Self-pay | Admitting: *Deleted

## 2023-01-08 LAB — COVID-19, FLU A+B AND RSV
Influenza A, NAA: NOT DETECTED
Influenza B, NAA: NOT DETECTED
RSV, NAA: NOT DETECTED
SARS-CoV-2, NAA: NOT DETECTED

## 2023-01-08 LAB — MICROALBUMIN / CREATININE URINE RATIO
Creatinine, Urine: 25.3 mg/dL
Microalb/Creat Ratio: <12 mg/g creat (ref 0–29)
Microalbumin, Urine: <3 ug/mL

## 2023-01-08 NOTE — Telephone Encounter (Signed)
Reason for Disposition  [1] Caller requesting NON-URGENT health information AND [2] PCP's office is the best resource  Answer Assessment - Initial Assessment Questions 1. REASON FOR CALL or QUESTION: "What is your reason for calling today?" or "How can I best help you?" or "What question do you have that I can help answer?"     Pt called in for her lab results however there was no interpretation.   Message sent to Juluis Mire, NP.  Protocols used: Information Only Call - No Triage-A-AH

## 2023-01-08 NOTE — Telephone Encounter (Signed)
  Chief Complaint: Called in for lab results. Symptoms: N/A Frequency: N/A Pertinent Negatives: Patient denies N/A Disposition: [] ED /[] Urgent Care (no appt availability in office) / [] Appointment(In office/virtual)/ []  [facility]/ [] Home Care/ [] Refused Recommended Disposition /[] [facility]/ [x]  Follow-up with PCP Additional Notes: There were no interpretation of the lab results to give to the pt.  Message sent to Juluis Mire, NP.

## 2023-01-08 NOTE — Telephone Encounter (Signed)
Will forward to provider  

## 2023-01-13 ENCOUNTER — Telehealth (INDEPENDENT_AMBULATORY_CARE_PROVIDER_SITE_OTHER): Payer: Self-pay

## 2023-01-13 NOTE — Telephone Encounter (Signed)
Contacted pt to go over lab results pt is aware and doesn't have any questions or concerns 

## 2023-01-15 ENCOUNTER — Other Ambulatory Visit: Payer: Self-pay

## 2023-01-15 DIAGNOSIS — Z1231 Encounter for screening mammogram for malignant neoplasm of breast: Secondary | ICD-10-CM

## 2023-02-10 ENCOUNTER — Ambulatory Visit: Payer: Self-pay

## 2023-02-16 ENCOUNTER — Other Ambulatory Visit: Payer: Self-pay

## 2023-03-12 ENCOUNTER — Ambulatory Visit: Payer: Self-pay

## 2023-04-09 ENCOUNTER — Ambulatory Visit
Admission: RE | Admit: 2023-04-09 | Discharge: 2023-04-09 | Disposition: A | Discharge status: Home / Self Care | Payer: No Typology Code available for payment source | Source: Ambulatory Visit | Attending: Obstetrics and Gynecology | Admitting: Obstetrics and Gynecology

## 2023-04-09 ENCOUNTER — Ambulatory Visit: Payer: Self-pay | Admitting: Hematology and Oncology

## 2023-04-09 VITALS — BP 141/61 | Wt 137.0 lb

## 2023-04-09 DIAGNOSIS — Z1231 Encounter for screening mammogram for malignant neoplasm of breast: Secondary | ICD-10-CM

## 2023-04-09 DIAGNOSIS — Z1211 Encounter for screening for malignant neoplasm of colon: Secondary | ICD-10-CM

## 2023-04-09 NOTE — Patient Instructions (Signed)
Taught Susan [name] about self breast awareness and gave educational materials to take home. Patient did not need a Pap smear today due to last Pap smear was in 11/12/20 per patient.  Let her know [facility] will cover Pap smears every 5 years unless has a history of abnormal Pap smears. [facility]erred [facility] to [facility] for screening mammogram. Appointment scheduled for 04/09/23. Patient aware of appointment and will be there. Let patient know will follow up with her within the next couple weeks with results. Susan [name] verbalized understanding.  Pascal Lux, NP 1:18 PM

## 2023-04-09 NOTE — Progress Notes (Signed)
Susan [name] is a 47 y.o. female who presents to [facility] clinic today with no complaints.    Pap Smear: Pap not smear completed today. Last Pap smear was 11/12/20 and was normal. Per patient has no history of an abnormal Pap smear. Last Pap smear result is available in Epic.   Physical exam: Breasts Breasts symmetrical. No skin abnormalities bilateral breasts. No nipple retraction bilateral breasts. No nipple discharge bilateral breasts. No lymphadenopathy. No lumps palpated bilateral breasts.  MS DIGITAL SCREENING TOMO BILATERAL  Result Date: 01/14/2022 CLINICAL DATA:  Screening. EXAM: DIGITAL SCREENING BILATERAL MAMMOGRAM WITH TOMOSYNTHESIS AND CAD TECHNIQUE: Bilateral screening digital craniocaudal and mediolateral oblique mammograms were obtained. Bilateral screening digital breast tomosynthesis was performed. The images were evaluated with computer-aided detection. COMPARISON:  Previous exam(s). ACR Breast Density Category c: The breast tissue is heterogeneously dense, which may obscure small masses. FINDINGS: There are no findings suspicious for malignancy. IMPRESSION: No mammographic evidence of malignancy. A result letter of this screening mammogram will be mailed directly to the patient. RECOMMENDATION: Screening mammogram in one year. (Code:SM-B-01Y) BI-RADS CATEGORY  1: Negative. Electronically Signed   By: Elberta Fortis M.D.   On: 01/14/2022 12:00   MS DIGITAL SCREENING TOMO BILATERAL  Result Date: 01/04/2021 CLINICAL DATA:  Screening. EXAM: DIGITAL SCREENING BILATERAL MAMMOGRAM WITH TOMO AND CAD COMPARISON:  Previous exam(s). ACR Breast Density Category c: The breast tissue is heterogeneously dense, which may obscure small masses. FINDINGS: There are no findings suspicious for malignancy. The images were evaluated with computer-aided detection. IMPRESSION: No mammographic evidence of malignancy. A result letter of this screening mammogram will be mailed directly to the  patient. RECOMMENDATION: Screening mammogram in one year. (Code:SM-B-01Y) BI-RADS CATEGORY  1: Negative. Electronically Signed   By: Emmaline Kluver M.D.   On: 01/04/2021 09:12   MS DIGITAL SCREENING TOMO BILATERAL  Result Date: 11/15/2019 CLINICAL DATA:  Screening. EXAM: DIGITAL SCREENING BILATERAL MAMMOGRAM WITH TOMO AND CAD COMPARISON:  Previous exam(s). ACR Breast Density Category c: The breast tissue is heterogeneously dense, which may obscure small masses. FINDINGS: There are no findings suspicious for malignancy. Images were processed with CAD. IMPRESSION: No mammographic evidence of malignancy. A result letter of this screening mammogram will be mailed directly to the patient. RECOMMENDATION: Screening mammogram in one year. (Code:SM-B-01Y) BI-RADS CATEGORY  1: Negative. Electronically Signed   By: Emmaline Kluver M.D.   On: 11/15/2019 14:07         Pelvic/Bimanual Pap is not indicated today    Smoking History: Patient has never smoked and was not referred to quit line.    Patient Navigation: Patient education provided. Access to services provided for patient through [facility] program. [facility] interpreter provided. No transportation provided   Colorectal Cancer Screening: Per patient has never had colonoscopy completed No complaints today. FIT test given.   Breast and Cervical Cancer Risk Assessment: Patient does not have family history of breast cancer, known genetic mutations, or radiation treatment to the chest before age 56. Patient does not have history of cervical dysplasia, immunocompromised, or DES exposure in-utero.  Risk Assessment   No risk assessment data for the current encounter  Risk Scores       01/03/2021   Last edited by: Narda Rutherford, LPN   5-year risk: 0.4 %   Lifetime risk: 4.9 %            A: BCCCP exam without pap smear No complaints with benign exam.   P: Referred patient to  [facility] for a screening mammogram.  Appointment scheduled 04/09/23.  Ilda Basset A, NP 04/09/2023 1:17 PM

## 2023-05-07 ENCOUNTER — Other Ambulatory Visit: Payer: Self-pay

## 2023-06-10 ENCOUNTER — Other Ambulatory Visit (INDEPENDENT_AMBULATORY_CARE_PROVIDER_SITE_OTHER): Payer: Self-pay | Admitting: Primary Care

## 2023-06-10 ENCOUNTER — Ambulatory Visit (INDEPENDENT_AMBULATORY_CARE_PROVIDER_SITE_OTHER): Payer: Self-pay | Admitting: *Deleted

## 2023-06-10 ENCOUNTER — Other Ambulatory Visit: Payer: Self-pay

## 2023-06-10 DIAGNOSIS — Z760 Encounter for issue of repeat prescription: Secondary | ICD-10-CM

## 2023-06-10 MED ORDER — IBUPROFEN 800 MG PO TABS
800.0000 mg | ORAL_TABLET | Freq: Three times a day (TID) | ORAL | 0 refills | Status: DC | PRN
  Filled 2023-06-10: qty 90, 30d supply, fill #0

## 2023-06-10 NOTE — Telephone Encounter (Signed)
Requested Prescriptions  Pending Prescriptions Disp Refills   ibuprofen (ADVIL) 800 MG tablet 90 tablet 0    Sig: Take 1 tablet (800 mg total) by mouth every 8 (eight) hours as needed for moderate pain.     Analgesics:  NSAIDS Failed - 06/10/2023 11:32 AM      Failed - Manual Review: Labs are only required if the patient has taken medication for more than 8 weeks.      Failed - Cr in normal range and within 360 days    Creatinine, Ser  Date Value Ref Range Status  01/06/2023 0.56 (L) 0.57 - 1.00 mg/dL Final         Passed - HGB in normal range and within 360 days    Hemoglobin  Date Value Ref Range Status  01/06/2023 13.1 11.1 - 15.9 g/dL Final         Passed - PLT in normal range and within 360 days    Platelets  Date Value Ref Range Status  01/06/2023 229 150 - 450 x10E3/uL Final         Passed - HCT in normal range and within 360 days    Hematocrit  Date Value Ref Range Status  01/06/2023 38.8 34.0 - 46.6 % Final         Passed - eGFR is 30 or above and within 360 days    GFR calc Af Amer  Date Value Ref Range Status  10/16/2020 115 >59 mL/min/1.73 Final    Comment:    **In accordance with recommendations from the NKF-ASN Task force,**   [facility] is in the process of updating its eGFR calculation to the   2021 CKD-EPI creatinine equation that estimates kidney function   without a race variable.    GFR, Estimated  Date Value Ref Range Status  11/28/2020 >60 >60 mL/min Final    Comment:    (NOTE) Calculated using the CKD-EPI Creatinine Equation (2021)    eGFR  Date Value Ref Range Status  01/06/2023 113 >59 mL/min/1.73 Final         Passed - Patient is not pregnant      Passed - Valid encounter within last 12 months    Recent Outpatient Visits           5 months ago Type 2 diabetes mellitus without complication, without long-term current use of insulin (HCC)   [place] [facility] Grayce Sessions, NP   9 months ago Type 2 diabetes  mellitus without complication, without long-term current use of insulin (HCC)   [place] [facility] Grayce Sessions, NP   1 year ago Type 2 diabetes mellitus without complication, without long-term current use of insulin (HCC)   [place] [facility] Grayce Sessions, NP   1 year ago Dysuria   [place] [facility] Grayce Sessions, NP   1 year ago Type 2 diabetes mellitus without complication, without long-term current use of insulin [facility])   [place] [facility] Grayce Sessions, NP       Future Appointments             In 2 days Randa Evens, Kinnie Scales, NP [place] [facility]

## 2023-06-10 NOTE — Telephone Encounter (Signed)
Medication Refill - Medication: ibuprofen (ADVIL) 800 MG tablet   Has the patient contacted their pharmacy? Yes.    (Agent: If yes, when and what did the pharmacy advise?)  Preferred Pharmacy (with phone number or street name):  [facility]  [address] [place] [address]  Phone: [phone] Fax: [phone]  Hours: M-F 7:30a-6:00p   Has the patient been seen for an appointment in the last year OR does the patient have an upcoming appointment? Yes.    Agent: Please be advised that RX refills may take up to 3 business days. We ask that you follow-up with your pharmacy.

## 2023-06-10 NOTE — Telephone Encounter (Signed)
Please schedule pt an appt

## 2023-06-10 NOTE — Telephone Encounter (Signed)
Interpreter Devra Dopp ID # [phone] Chief Complaint: worsening knee pain requesting medications Symptoms: left knee some swelling , limping to walk, aching, severe pain comes and goes. Has been taking ibuprofen with some relief but does not have any medication left. Trying to use OTC  Frequency: 2 weeks  Pertinent Negatives: Patient denies inability to walk, no calf pain  Disposition: [] ED /[] Urgent Care (no appt availability in office) / [] Appointment(In office/virtual)/ []  [facility]/ [] Home Care/ [] Refused Recommended Disposition /[x] [facility]/ []  Follow-up with PCP Additional Notes:   Recommended mobile bus and gave address please advise if ibuprofen can be refilled. Appt already scheduled for 06/12/23.      Reason for Disposition  [1] SEVERE pain (e.g., excruciating, unable to walk) AND [2] not improved after 2 hours of pain medicine  Answer Assessment - Initial Assessment Questions 1. LOCATION and RADIATION: "Where is the pain located?"      Left knee pain severe 2. QUALITY: "What does the pain feel like?"  (e.g., sharp, dull, aching, burning)     Aching  3. SEVERITY: "How bad is the pain?" "What does it keep you from doing?"   (Scale 1-10; or mild, moderate, severe)   -  MILD (1-3): doesn't interfere with normal activities    -  MODERATE (4-7): interferes with normal activities (e.g., work or school) or awakens from sleep, limping    -  SEVERE (8-10): excruciating pain, unable to do any normal activities, unable to walk     Severe pain limping with walking unable to sleep wakes up middle of night  4. ONSET: "When did the pain start?" "Does it come and go, or is it there all the time?"     Comes and goes but now 2 weeks worsening pain  5. RECURRENT: "Have you had this pain before?" If Yes, ask: "When, and what happened then?"     Yes worse now  6. SETTING: "Has there been any recent work, exercise or other activity that involved that part of the body?"      Na   7. AGGRAVATING FACTORS: "What makes the knee pain worse?" (e.g., walking, climbing stairs, running)     Limping with walking  8. ASSOCIATED SYMPTOMS: "Is there any swelling or redness of the knee?"     A little swollen no redness 9. OTHER SYMPTOMS: "Do you have any other symptoms?" (e.g., chest pain, difficulty breathing, fever, calf pain)     Top of knee pain  10. PREGNANCY: "Is there any chance you are pregnant?" "When was your last menstrual period?"       na  Protocols used: Knee Pain-A-AH

## 2023-06-12 ENCOUNTER — Ambulatory Visit (INDEPENDENT_AMBULATORY_CARE_PROVIDER_SITE_OTHER): Payer: Self-pay | Admitting: Primary Care

## 2023-06-12 ENCOUNTER — Encounter (INDEPENDENT_AMBULATORY_CARE_PROVIDER_SITE_OTHER): Payer: Self-pay | Admitting: Primary Care

## 2023-06-12 ENCOUNTER — Other Ambulatory Visit: Payer: Self-pay

## 2023-06-12 VITALS — BP 130/67 | HR 102 | Resp 16 | Wt 133.4 lb

## 2023-06-12 DIAGNOSIS — G8929 Other chronic pain: Secondary | ICD-10-CM

## 2023-06-12 DIAGNOSIS — R0989 Other specified symptoms and signs involving the circulatory and respiratory systems: Secondary | ICD-10-CM

## 2023-06-12 DIAGNOSIS — E782 Mixed hyperlipidemia: Secondary | ICD-10-CM

## 2023-06-12 DIAGNOSIS — I10 Essential (primary) hypertension: Secondary | ICD-10-CM

## 2023-06-12 DIAGNOSIS — Z7984 Long term (current) use of oral hypoglycemic drugs: Secondary | ICD-10-CM

## 2023-06-12 DIAGNOSIS — M25562 Pain in left knee: Secondary | ICD-10-CM

## 2023-06-12 DIAGNOSIS — Z760 Encounter for issue of repeat prescription: Secondary | ICD-10-CM

## 2023-06-12 DIAGNOSIS — E119 Type 2 diabetes mellitus without complications: Secondary | ICD-10-CM

## 2023-06-12 LAB — POCT GLYCOSYLATED HEMOGLOBIN (HGB A1C): HbA1c, POC (controlled diabetic range): 6.4 % (ref 0.0–7.0)

## 2023-06-12 MED ORDER — ATORVASTATIN CALCIUM 20 MG PO TABS
20.0000 mg | ORAL_TABLET | Freq: Every day | ORAL | 1 refills | Status: DC
Start: 2023-06-12 — End: 2023-09-24
  Filled 2023-06-12 – 2023-07-21 (×2): qty 90, 90d supply, fill #0

## 2023-06-12 MED ORDER — LISINOPRIL 20 MG PO TABS
20.0000 mg | ORAL_TABLET | Freq: Every day | ORAL | 1 refills | Status: DC
Start: 2023-06-12 — End: 2023-06-20
  Filled 2023-06-12: qty 90, 90d supply, fill #0

## 2023-06-12 MED ORDER — HYDROCHLOROTHIAZIDE 25 MG PO TABS
25.0000 mg | ORAL_TABLET | Freq: Every day | ORAL | 1 refills | Status: DC
Start: 2023-06-12 — End: 2023-06-20
  Filled 2023-06-12: qty 90, 90d supply, fill #0

## 2023-06-12 MED ORDER — FLUTICASONE PROPIONATE 50 MCG/ACT NA SUSP
2.0000 | Freq: Every day | NASAL | 6 refills | Status: DC
Start: 2023-06-12 — End: 2023-06-20
  Filled 2023-06-12: qty 16, 30d supply, fill #0

## 2023-06-12 MED ORDER — METFORMIN HCL ER 500 MG PO TB24
500.0000 mg | ORAL_TABLET | Freq: Two times a day (BID) | ORAL | 1 refills | Status: DC
Start: 2023-06-12 — End: 2023-09-24
  Filled 2023-06-12 – 2023-07-21 (×2): qty 180, 90d supply, fill #0

## 2023-06-12 MED ORDER — DICLOFENAC SODIUM 1 % EX GEL
4.0000 g | Freq: Four times a day (QID) | CUTANEOUS | 0 refills | Status: DC
Start: 2023-06-12 — End: 2023-06-20
  Filled 2023-06-12 (×2): qty 100, 6d supply, fill #0

## 2023-06-12 MED ORDER — LORATADINE 10 MG PO TABS
10.0000 mg | ORAL_TABLET | Freq: Every day | ORAL | 11 refills | Status: DC
Start: 2023-06-12 — End: 2023-06-20
  Filled 2023-06-12: qty 30, 30d supply, fill #0

## 2023-06-12 NOTE — Progress Notes (Signed)
[facility]  Susan [name], is a 47 y.o. female  [id number]  [id number]  DOB - 04/30/1976  Chief Complaint  Patient presents with   Diabetes   Knee Pain    Left       Subjective:   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  HTN-Patient has No headache, No chest pain, No abdominal pain - No Nausea, No new weakness tingling or numbness, No Cough - shortness of breath. Type 2 diabetes - Denies polyuria, polydipsia, polyphasia or vision changes.  Does not check blood sugars at home.   No problems updated.  No Known Allergies  Past Medical History:  Diagnosis Date   Diabetes 1.5, managed as type 2 (HCC)    Hypertension     Current Outpatient Medications on File Prior to Visit  Medication Sig Dispense Refill   guaiFENesin 200 MG tablet Take 1 tablet (200 mg total) by mouth every 4 (four) hours as needed for cough or to loosen phlegm. (Patient not taking: Reported on 04/09/2023) 30 suppository 0   ibuprofen (ADVIL) 800 MG tablet Take 1 tablet (800 mg total) by mouth every 8 (eight) hours as needed for moderate pain. 90 tablet 0   No current facility-administered medications on file prior to visit.    Objective:   Vitals:   06/12/23 1031  BP: 130/67  Pulse: (Abnormal) 102  Resp: 16  SpO2: 99%  Weight: 133 lb 6.4 oz (60.5 kg)    Comprehensive ROS Pertinent positive and negative noted in HPI   Exam General appearance : Awake, alert, not in any distress. Speech Clear. Not toxic looking HEENT: Atraumatic and Normocephalic, pupils equally reactive to light and accomodation( right ear upper right white mesh) Neck: Supple, no JVD. No cervical lymphadenopathy.  Chest: Good air entry bilaterally, no added sounds  CVS: S1 S2 regular, no murmurs.  Abdomen: Bowel sounds present, Non tender and not distended with no gaurding, rigidity or rebound. Extremities: B/L Lower Ext shows no  edema, both legs are warm to touch- left knee crepitus present  Neurology: Awake alert, and oriented X 3, CN II-XII intact, Non focal Skin: No Rash  Data Review Lab Results  Component Value Date   HGBA1C 6.4 06/12/2023   HGBA1C 6.4 (H) 01/06/2023   HGBA1C 6.5 09/02/2022    Assessment & Plan  Tamitra was seen today for diabetes and knee pain.  Diagnoses and all orders for this visit:  Type 2 diabetes mellitus without complication, without long-term current use of insulin (HCC) -     POCT glycosylated hemoglobin (Hb A1C) 6.4 - educated on lifestyle modifications, including but not limited to diet choices and adding exercise to daily routine.   -     metFORMIN (GLUCOPHAGE-XR) 500 MG 24 hr tablet; Take 1 tablet (500 mg total) by mouth 2 (two) times daily.  Mixed hyperlipidemia  Healthy lifestyle diet of fruits vegetables fish nuts whole grains and low saturated fat . Foods high in cholesterol or liver, fatty meats,cheese, butter avocados, nuts and seeds, chocolate and fried foods.- -     Lipid Panel -     atorvastatin (LIPITOR) 20 MG tablet; Take 1 tablet (20 mg total) by mouth daily.  Essential hypertension Controlled BP goal - < 130/80 Explained that having normal blood pressure is the goal and medications are helping to get to goal and maintain normal blood pressure. DIET: Limit salt intake, read nutrition labels  to check salt content, limit fried and high fatty foods  Avoid using multisymptom OTC cold preparations that generally contain sudafed which can rise BP. Consult with pharmacist on best cold relief products to use for persons with HTN EXERCISE Discussed incorporating exercise such as walking - 30 minutes most days of the week and can do in 10 minute intervals    -     CMP14+EGFR -     hydrochlorothiazide (HYDRODIURIL) 25 MG tablet; Take 1 tablet (25 mg total) by mouth daily. -     lisinopril (ZESTRIL) 20 MG tablet; Take 1 tablet (20 mg total) by mouth daily.  Medication  refill -     atorvastatin (LIPITOR) 20 MG tablet; Take 1 tablet (20 mg total) by mouth daily. -     fluticasone (FLONASE) 50 MCG/ACT nasal spray; Place 2 sprays into both nostrils daily. -     hydrochlorothiazide (HYDRODIURIL) 25 MG tablet; Take 1 tablet (25 mg total) by mouth daily. -     lisinopril (ZESTRIL) 20 MG tablet; Take 1 tablet (20 mg total) by mouth daily. -     metFORMIN (GLUCOPHAGE-XR) 500 MG 24 hr tablet; Take 1 tablet (500 mg total) by mouth 2 (two) times daily.  Respiratory symptoms -     fluticasone (FLONASE) 50 MCG/ACT nasal spray; Place 2 sprays into both nostrils daily. -     loratadine (CLARITIN) 10 MG tablet; Take 1 tablet (10 mg total) by mouth daily.  Chronic pain of left knee diclofenac Sodium (VOLTAREN) 1 % GEL      Patient have been counseled extensively about nutrition and exercise. Other issues discussed during this visit include: low cholesterol diet, weight control and daily exercise, foot care, annual eye examinations at Ophthalmology, importance of adherence with medications and regular follow-up. We also discussed long term complications of uncontrolled diabetes and hypertension.   Return in about 6 months (around 12/13/2023).  The patient was given clear instructions to go to ER or return to medical center if symptoms don't improve, worsen or new problems develop. The patient verbalized understanding. The patient was told to call to get lab results if they haven't heard anything in the next week.   This note has been created with Education officer, environmental. Any transcriptional errors are unintentional.   Grayce Sessions, NP 06/12/2023, 11:12 AM

## 2023-06-12 NOTE — Patient Instructions (Signed)
COVID prolongado Enviar esta pgina a un amigo Imprimir Facebook Twitter Pinterest La mayora de las personas que tiene COVID-19 se recupera por completo. Algunas personas siguen teniendo problemas de salud despus de tener COVID-19. Esto se denomina COVID prolongado.  El COVID prolongado se puede presentar en personas que tuvieron enfermedad de leve a grave o no presentaron ningn sntoma durante una infeccin activa por COVID-19. El COVID prolongado casi siempre afecta a los adultos. Tambin le puede ocurrir a los nios, aunque es menos frecuente.  Otros nombres para el COVID prolongado incluyen: COVID de larga duracin, COVID crnico, afecciones post-COVID (PCC, por sus siglas en ingls), y secuelas posagudas de infeccin por SARS-CoV-2 (PASC, por sus siglas en ingls).  Causas Los expertos en salud no estn seguros de qu causa exactamente el COVID prolongado. Algunas teoras incluyen:  Las partculas del virus SARS-CoV-2 se activan de nuevo en el cuerpo despus de la enfermedad inicial o a uno o ms rganos de Therapist, nutritional est tomando ms tiempo recuperarse que al resto El sistema inmunitario se vuelve hiperactivo, causando inflamacin en el cuerpo El sistema inmunitario hiperactivo hace que los anticuerpos ataquen los rganos y tejidos Los problemas de salud del COVID prolongado pueden deberse a ms de uno de Advertising copywriter. Se estn haciendo investigaciones para averiguar qu causa el COVID prolongado y quin tiene Equities trader.  Su riesgo de Theatre manager puede ser mayor si usted:  Tuvo enfermedad grave por COVID-19 Requiri hospitalizacin o cuidados intensivos durante la infeccin por COVID-19 Tuvo otras afecciones mdicas antes de la infeccin por COVID-19 Tuvo inflamacin en el corazn, pulmones, riones, cerebro u otras reas en el cuerpo despus de la infeccin por COVID-19 (sndrome inflamatorio multisistmico) No est vacunado Es un adulto mayor Es  mujer Las personas con alto riesgo de infeccin por COVID-19 debido al Environmental consultant en donde viven y Chiropodist y los miembros de comunidades que no tienen un buen Teacher, adult education a atencin mdica tambin pueden estar en riesgo de Marine scientist COVID prolongado.  Las personas con COVID-19 leve tambin pueden Theatre manager. Algunas personas han desarrollado COVID prolongado y nunca supieron que tenan una infeccin por COVID-19.  Sntomas Se dice que una persona que tiene sntomas que persisten por 4 o ms semanas despus de un caso de COVID-19 tiene COVID prolongado. El COVID prolongado no afecta a todas las personas de la misma [place]. Los sntomas pueden:  Continuar despus de la enfermedad inicial Empezar ms tarde despus de que se recupere Desaparecer y Archivist o Theme park manager con el tiempo Ser algunos de los mismos sntomas que experiment cuando tuvo COVID-19 Ser sntomas nuevos Los sntomas de COVID prolongado pueden durar semanas, meses o ms. Aunque muchas personas tienen sntomas leves, el COVID prolongado puede ser incapacitante para International aid/development worker.  Los sntomas comunes del COVID prolongado incluyen:  Fatiga o cansancio que dificulta Education officer, environmental las actividades diarias [employer] Dificultad respiratoria o falta de aliento Tos Cambios en el sentido del olfato o del gusto Empeoramiento de los sntomas despus de actividad fsica o mental vigorosa El COVID prolongado puede afectar a diferentes sistemas corporales e incluye otros sntomas:  Cerebro y nervios -- dolor de cabeza, dificultad para pensar o concentrarse (algunas veces denominada "niebla mental"), problemas para dormir, mareo, hormigueo, entumecimiento Salud mental -- cambios de humor, depresin, ansiedad, trastorno de estrs postraumtico (TEPT) Huesos y articulaciones -- Administrator, sports, dolor muscular Digestivos -- dolor estomacal, diarrea, estreimiento, cambios de apetito Corazn y sangre -- cogulos sanguneos,  dolor de  pecho, palpitaciones (ritmo cardaco rpido o irregular) El COVID prolongado puede afectar mltiples rganos en el cuerpo, como los [place], los riones, el corazn, el cerebro y la piel. Las personas que tuvieron COVID-19 grave tienen ms riesgo de sufrir [employer] . Esto aumenta el riesgo de que una persona desarrolle diabetes, afecciones cardacas o problemas neurolgicos en comparacin con las personas que no tuvieron COVID-19.  Algunos sntomas de COVID, como debilidad muscular y TEPT pueden ser el resultado de haber pasado mucho tiempo en el hospital. Estos efectos pueden ocurrir derivados de cualquier enfermedad que lleve a Theme park manager en cama por mucho tiempo y estar conectado a un respirador (intubado).  Los sntomas del COVID prolongado varan mucho y puede ser difcil determinarlos y controlarlos. Debido a esto, puede ser [employer] con COVID prolongado reciban un diagnstico adecuado.  Pruebas y exmenes No hay pruebas especficas para detectar el COVID prolongado. Su proveedor de atencin mdica puede diagnosticarle COVID prolongado segn:  [employer] sntomas Un examen fsico Su historial mdico, incluyendo si tuvo COVID-19 antes Conteo sanguneo completo (CSC) Panel metablico completo (PMC) Anlisis de orina Pruebas de la funcin heptica Protena C-reactiva Tasa de sedimentacin eritroctica (ESR, por sus siglas en ingls) TSH T4 libre Radiografas del Associate Professor o IRM cardacas Pruebas para deficiencias vitamnicas vitamina D y B12 Prueba de anticuerpos para COVID-19 para documentar su infeccin inicial Las pruebas mdicas pueden ser normales a pesar de la presencia de sntomas, y esto no descarta un diagnstico de COVID prolongado.  Tratamiento No hay una cura especfica para el COVID prolongado. El tratamiento depender de sus sntomas y puede incluir:  Un programa de ejercicios progresivo Terapia fsica Terapia  ocupacional Terapia del habla y del lenguaje Rehabilitacin neurolgica (para sntomas cognitivos) Rehabilitacin pulmonar Atencin de salud mental Medicamentos para ayudar a Chief Operating Officer los sntomas Es posible que desee considerar unirse a un ensayo clnico de COVID prolongado. Un ensayo clnico es un estudio en el que las personas acuerdan participar en pruebas o tratamientos nuevos. Los ensayos clnicos les ayudan a los investigadores a saber si un tratamiento nuevo funciona bien y [place]. Los Institutos Nacionales de Salud crearon RECOVER: Researching COVID to Enhance Recovery en donde puede aprender ms [employer] de COVID prolongado cerca de usted.  Para obtener la informacin ms actualizada sobre COVID prolongado, visite estos sitios web:  Centros para Air traffic controller y la Prevencin de Enfermedades: COVID prolongado o afecciones post-COVID -- BeautyRebate.co.nz o http://santos.net/.  Centros para Air traffic controller y la Prevencin de Enfermedades: Afecciones post-COVID: Ciencia de los CDC -- https://www.arroyo.com/.  Departamento de Salud y Servicios Humanos de EE. UU.: Tratamientos para COVID-19 -- http://www.anderson.biz/.aspx.  Grupos de apoyo Puede encontrar ms informacin y apoyo para personas con COVID prolongado y sus familias en:  Long COVID Kids -- www.longcovidkids.org/es/support-services Gua de recursos e informacin de COVID -19 de la [employer] on Mental Illness -- https://www.west.net/ o CampMissions.ca Patent examiner (pronstico) El COVID-19 es una enfermedad nueva, as que nadie sabe con exactitud cul  ser el pronstico para las personas con sntomas post-COVID. La [employer] de las personas que tienen sntomas despus de 4 semanas de haber tenido una infeccin por COVID-19 mejorar gradualmente. Se estn realizando investigaciones acerca de cmo ayudar a las personas con COVID prolongado a Technical sales engineer.  Cundo contactar a un profesional mdico Debe comunicarse con su proveedor si tuvo COVID-19 y tiene sntomas persistentes por 4 semanas o ms, o si vuelve a [employer] sntomas u otros despus de  4 semanas o ms de haberse enfermado.  Llame al 911 o al nmero local de emergencias si tiene:  Problemas respiratorios Dolor o presin en el pecho Confusin o incapacidad para despertarse Labios o cara de color azul o gris Cualquier otro sntoma que sea grave o le preocupe Prevencin La nica manera de prevenir el COVID prolongado es protegerse de una infeccin por COVID-19.  Reciba una vacuna contra COVID-19 actualizada. Evite el contacto con personas que se sospeche o tengan COVID-19 confirmado Realcese una prueba si desarrolla sntomas de COVID-19 o tiene contacto con alguien que tenga esta enfermedad Nombres alternativos Post-COVID; Afecciones post-COVID; PCC; COVID de larga duracin; COVID-19 posagudo; Secuelas posagudas de infeccin por SARS-CoV-2; PASC; Efectos a largo plazo de COVID-19; COVID crnico

## 2023-06-13 LAB — CMP14+EGFR
ALT: 61 IU/L — ABNORMAL HIGH (ref 0–32)
AST: 35 IU/L (ref 0–40)
Albumin: 4.2 g/dL (ref 3.9–4.9)
Alkaline Phosphatase: 119 IU/L (ref 44–121)
BUN/Creatinine Ratio: 35 — ABNORMAL HIGH (ref 9–23)
BUN: 15 mg/dL (ref 6–24)
Bilirubin Total: 0.3 mg/dL (ref 0.0–1.2)
CO2: 22 mmol/L (ref 20–29)
Calcium: 9.9 mg/dL (ref 8.7–10.2)
Chloride: 105 mmol/L (ref 96–106)
Creatinine, Ser: 0.43 mg/dL — ABNORMAL LOW (ref 0.57–1.00)
Globulin, Total: 3.1 g/dL (ref 1.5–4.5)
Glucose: 126 mg/dL — ABNORMAL HIGH (ref 70–99)
Potassium: 4.4 mmol/L (ref 3.5–5.2)
Sodium: 140 mmol/L (ref 134–144)
Total Protein: 7.3 g/dL (ref 6.0–8.5)
eGFR: 120 mL/min/{1.73_m2} (ref >59)

## 2023-06-13 LAB — LIPID PANEL
Chol/HDL Ratio: 2.5 ratio (ref 0.0–4.4)
Cholesterol, Total: 108 mg/dL (ref 100–199)
HDL: 44 mg/dL (ref >39)
LDL Chol Calc (NIH): 44 mg/dL (ref 0–99)
Triglycerides: 105 mg/dL (ref 0–149)
VLDL Cholesterol Cal: 20 mg/dL (ref 5–40)

## 2023-06-18 ENCOUNTER — Other Ambulatory Visit: Payer: Self-pay

## 2023-06-18 ENCOUNTER — Inpatient Hospital Stay (HOSPITAL_COMMUNITY)
Admission: EM | Admit: 2023-06-18 | Discharge: 2023-06-20 | DRG: 645 | Disposition: A | Discharge status: Home / Self Care | Payer: Medicaid Other | Attending: Internal Medicine | Admitting: Internal Medicine

## 2023-06-18 DIAGNOSIS — Z79899 Other long term (current) drug therapy: Secondary | ICD-10-CM

## 2023-06-18 DIAGNOSIS — R634 Abnormal weight loss: Secondary | ICD-10-CM | POA: Diagnosis present

## 2023-06-18 DIAGNOSIS — I10 Essential (primary) hypertension: Secondary | ICD-10-CM

## 2023-06-18 DIAGNOSIS — E876 Hypokalemia: Secondary | ICD-10-CM | POA: Diagnosis present

## 2023-06-18 DIAGNOSIS — E0590 Thyrotoxicosis, unspecified without thyrotoxic crisis or storm: Principal | ICD-10-CM | POA: Diagnosis present

## 2023-06-18 DIAGNOSIS — I4891 Unspecified atrial fibrillation: Secondary | ICD-10-CM | POA: Diagnosis present

## 2023-06-18 DIAGNOSIS — Z6828 Body mass index (BMI) 28.0-28.9, adult: Secondary | ICD-10-CM

## 2023-06-18 DIAGNOSIS — Z7984 Long term (current) use of oral hypoglycemic drugs: Secondary | ICD-10-CM

## 2023-06-18 DIAGNOSIS — Z8249 Family history of ischemic heart disease and other diseases of the circulatory system: Secondary | ICD-10-CM

## 2023-06-18 DIAGNOSIS — Z833 Family history of diabetes mellitus: Secondary | ICD-10-CM

## 2023-06-18 DIAGNOSIS — E139 Other specified diabetes mellitus without complications: Secondary | ICD-10-CM

## 2023-06-18 DIAGNOSIS — F419 Anxiety disorder, unspecified: Secondary | ICD-10-CM | POA: Diagnosis present

## 2023-06-18 LAB — BASIC METABOLIC PANEL
Anion gap: 8 (ref 5–15)
BUN: 20 mg/dL (ref 6–20)
CO2: 19 mmol/L — ABNORMAL LOW (ref 22–32)
Calcium: 8.9 mg/dL (ref 8.9–10.3)
Chloride: 107 mmol/L (ref 98–111)
Creatinine, Ser: 0.59 mg/dL (ref 0.44–1.00)
GFR, Estimated: >60 mL/min (ref >60)
Glucose, Bld: 201 mg/dL — ABNORMAL HIGH (ref 70–99)
Potassium: 3.1 mmol/L — ABNORMAL LOW (ref 3.5–5.1)
Sodium: 134 mmol/L — ABNORMAL LOW (ref 135–145)

## 2023-06-18 LAB — TROPONIN I (HIGH SENSITIVITY): Troponin I (High Sensitivity): 26 ng/L — ABNORMAL HIGH (ref <18)

## 2023-06-18 LAB — CBC
HCT: 35.5 % — ABNORMAL LOW (ref 36.0–46.0)
Hemoglobin: 11.8 g/dL — ABNORMAL LOW (ref 12.0–15.0)
MCH: 26.8 pg (ref 26.0–34.0)
MCHC: 33.2 g/dL (ref 30.0–36.0)
MCV: 80.7 fL (ref 80.0–100.0)
Platelets: 218 10*3/uL (ref 150–400)
RBC: 4.4 MIL/uL (ref 3.87–5.11)
RDW: 12.3 % (ref 11.5–15.5)
WBC: 12.4 10*3/uL — ABNORMAL HIGH (ref 4.0–10.5)
nRBC: 0 % (ref 0.0–0.2)

## 2023-06-18 LAB — MAGNESIUM: Magnesium: 1.9 mg/dL (ref 1.7–2.4)

## 2023-06-18 MED ORDER — DILTIAZEM HCL-DEXTROSE 125-5 MG/125ML-% IV SOLN (PREMIX)
5.0000 mg/h | INTRAVENOUS | Status: DC
  Administered 2023-06-18: 5 mg/h via INTRAVENOUS
  Filled 2023-06-18: qty 125

## 2023-06-18 MED ORDER — DILTIAZEM HCL 25 MG/5ML IV SOLN
INTRAVENOUS | Status: AC
  Administered 2023-06-18: 20 mg via INTRAVENOUS
  Filled 2023-06-18: qty 5

## 2023-06-18 MED ORDER — SODIUM CHLORIDE 0.9 % IV BOLUS (SEPSIS)
1000.0000 mL | Freq: Once | INTRAVENOUS | Status: AC
  Administered 2023-06-18: 1000 mL via INTRAVENOUS

## 2023-06-18 MED ORDER — APIXABAN 5 MG PO TABS
5.0000 mg | ORAL_TABLET | Freq: Two times a day (BID) | ORAL | Status: DC
  Administered 2023-06-19 – 2023-06-20 (×4): 5 mg via ORAL
  Filled 2023-06-18 (×4): qty 1

## 2023-06-18 MED ORDER — POTASSIUM CHLORIDE CRYS ER 20 MEQ PO TBCR
40.0000 meq | EXTENDED_RELEASE_TABLET | Freq: Once | ORAL | Status: AC
  Administered 2023-06-19: 40 meq via ORAL
  Filled 2023-06-18: qty 2

## 2023-06-18 MED ORDER — ADENOSINE 6 MG/2ML IV SOLN
INTRAVENOUS | Status: AC
  Administered 2023-06-18: 6 mg via INTRAVENOUS
  Filled 2023-06-18: qty 6

## 2023-06-18 MED ORDER — ADENOSINE 6 MG/2ML IV SOLN: 6.0000 mg | Freq: Once | INTRAVENOUS | Status: AC

## 2023-06-18 MED ORDER — SODIUM CHLORIDE 0.9 % IV SOLN
1000.0000 mL | INTRAVENOUS | Status: DC
  Administered 2023-06-19 (×4): 1000 mL via INTRAVENOUS

## 2023-06-18 MED ORDER — DILTIAZEM LOAD VIA INFUSION
20.0000 mg | Freq: Once | INTRAVENOUS | Status: DC
  Filled 2023-06-18: qty 20

## 2023-06-18 NOTE — ED Triage Notes (Signed)
C/o rapid heart rate and chest pressure since midnight. Hx htn and dm

## 2023-06-18 NOTE — ED Provider Notes (Signed)
[place] EMERGENCY DEPARTMENT AT [facility] Provider Note   CSN: [id number] Arrival date & time: 06/18/23  2114     History  Chief complaint: Palpitations, chest discomfort  Susan [name] is a 47 y.o. female.  HPI   Patient has history of diabetes and hypertension.  Patient presents to the ED for evaluation of heart racing that started about noon.  Patient states she started feeling her heart racing.  She has had some chest discomfort with it.  She is felt lightheaded.  She denies any fevers or chills.  Patient states she had a similar episode couple months ago but did not seek medical attention  Home Medications Prior to Admission medications   Medication Sig Start Date End Date Taking? Authorizing Provider  atorvastatin (LIPITOR) 20 MG tablet Take 1 tablet (20 mg total) by mouth daily. 06/12/23   Grayce Sessions, NP  diclofenac Sodium (VOLTAREN) 1 % GEL Apply 4 grams topically 4 (four) times daily. 06/12/23   Grayce Sessions, NP  fluticasone (FLONASE) 50 MCG/ACT nasal spray Place 2 sprays into both nostrils daily. 06/12/23   Grayce Sessions, NP  guaiFENesin 200 MG tablet Take 1 tablet (200 mg total) by mouth every 4 (four) hours as needed for cough or to loosen phlegm. Patient not taking: Reported on 04/09/2023 01/06/23   Grayce Sessions, NP  hydrochlorothiazide (HYDRODIURIL) 25 MG tablet Take 1 tablet (25 mg total) by mouth daily. 06/12/23   Grayce Sessions, NP  ibuprofen (ADVIL) 800 MG tablet Take 1 tablet (800 mg total) by mouth every 8 (eight) hours as needed for moderate pain. 06/10/23   Grayce Sessions, NP  lisinopril (ZESTRIL) 20 MG tablet Take 1 tablet (20 mg total) by mouth daily. 06/12/23   Grayce Sessions, NP  loratadine (CLARITIN) 10 MG tablet Take 1 tablet (10 mg total) by mouth daily. 06/12/23   Grayce Sessions, NP  metFORMIN (GLUCOPHAGE-XR) 500 MG 24 hr tablet Take 1 tablet (500 mg total) by mouth 2 (two) times daily. 06/12/23    Grayce Sessions, NP      Allergies    Patient has no known allergies.    Review of Systems   Review of Systems  Physical Exam Updated Vital Signs BP (!) 161/89   Pulse (!) 198   Temp 98.5 F (36.9 C) (Oral)   Resp (!) 22   Ht 1.448 m (4\' 9" )   Wt 59.9 kg   SpO2 98%   BMI 28.56 kg/m  Physical Exam Vitals and nursing note reviewed.  Constitutional:      General: She is not in acute distress.    Appearance: She is well-developed.  HENT:     Head: Normocephalic and atraumatic.     Right Ear: External ear normal.     Left Ear: External ear normal.  Eyes:     General: No scleral icterus.       Right eye: No discharge.        Left eye: No discharge.     Conjunctiva/sclera: Conjunctivae normal.  Neck:     Trachea: No tracheal deviation.  Cardiovascular:     Rate and Rhythm: Tachycardia present.  Pulmonary:     Effort: Pulmonary effort is normal. No respiratory distress.     Breath sounds: Normal breath sounds. No stridor. No wheezing or rales.  Abdominal:     General: Bowel sounds are normal. There is no distension.     Palpations: Abdomen is  soft.     Tenderness: There is no abdominal tenderness. There is no guarding or rebound.  Musculoskeletal:        General: No tenderness or deformity.     Cervical back: Neck supple.  Skin:    General: Skin is warm and dry.     Findings: No rash.  Neurological:     General: No focal deficit present.     Mental Status: She is alert.     Cranial Nerves: No cranial nerve deficit, dysarthria or facial asymmetry.     Sensory: No sensory deficit.     Motor: No abnormal muscle tone or seizure activity.     Coordination: Coordination normal.  Psychiatric:        Mood and Affect: Mood normal.     ED Results / Procedures / Treatments   Labs (all labs ordered are listed, but only abnormal results are displayed) Labs Reviewed  CBC - Abnormal; Notable for the following components:      Result Value   WBC 12.4 (*)     Hemoglobin 11.8 (*)    HCT 35.5 (*)    All other components within normal limits  BASIC METABOLIC PANEL - Abnormal; Notable for the following components:   Sodium 134 (*)    Potassium 3.1 (*)    CO2 19 (*)    Glucose, Bld 201 (*)    All other components within normal limits  TROPONIN I (HIGH SENSITIVITY) - Abnormal; Notable for the following components:   Troponin I (High Sensitivity) 26 (*)    All other components within normal limits  MAGNESIUM    EKG EKG Interpretation Date/Time:  Thursday June 18 2023 21:36:00 EDT Ventricular Rate:  182 PR Interval:  127 QRS Duration:  100 QT Interval:  255 QTC Calculation: 444 R Axis:   52  Text Interpretation: Atrial fibrillation with rapid V-rate Paired ventricular premature complexes Repolarization abnormality, prob rate related Missing lead(s): V2 Confirmed by Linwood Dibbles [id number]) on 06/18/2023 9:42:36 PM  Radiology No results found.  Procedures .Critical Care  Performed by: Linwood Dibbles, MD Authorized by: Linwood Dibbles, MD   Critical care provider statement:    Critical care time (minutes):  30   Critical care was time spent personally by me on the following activities:  Development of treatment plan with patient or surrogate, discussions with consultants, evaluation of patient's response to treatment, examination of patient, ordering and review of laboratory studies, ordering and review of radiographic studies, ordering and performing treatments and interventions, pulse oximetry, re-evaluation of patient's condition and review of old charts     Medications Ordered in ED Medications  sodium chloride 0.9 % bolus 1,000 mL (0 mLs Intravenous Stopped 06/18/23 2203)    Followed by  0.9 %  sodium chloride infusion (has no administration in time range)  diltiazem (CARDIZEM) 1 mg/mL load via infusion 20 mg (20 mg Intravenous Not Given 06/18/23 2151)    And  diltiazem (CARDIZEM) 125 mg in dextrose 5% 125 mL (1 mg/mL) infusion (10 mg/hr  Intravenous Rate/Dose Change 06/18/23 2239)  potassium chloride SA (KLOR-CON M) CR tablet 40 mEq (has no administration in time range)  apixaban (ELIQUIS) tablet 5 mg (has no administration in time range)  adenosine (ADENOCARD) 6 MG/2ML injection 6 mg (6 mg Intravenous Given 06/18/23 2133)  diltiazem (CARDIZEM) 25 MG/5ML injection (20 mg Intravenous Given 06/18/23 2140)    ED Course/ Medical Decision Making/ A&P Clinical Course as of 06/19/23 0011  Thu Jun 18, 2023  2223 Heart rate improving on Cardizem drip [JK]  2335 Case discussed with Dr Virgina Jock.  Will have cardiology consult on patient.  Requests medical admission [JK]  Fri Jun 19, 2023  0005 Case discussed with Dr. Joneen Roach regarding admission [JK]    Clinical Course User Index [JK] Linwood Dibbles, MD         [id number]-VASc Score: 3                    Medical Decision Making Problems Addressed: Atrial fibrillation with rapid ventricular response [facility]): acute illness or injury that poses a threat to life or bodily functions  Amount and/or Complexity of Data Reviewed Labs: ordered. Decision-making details documented in ED Course.  Risk Prescription drug management. Decision regarding hospitalization.  Rapid narrow complex tachycardia noted in the ED.  Initially suggestive of SVT.  6 mg of adenosine given without response.  Repeat EKG more suggestive of atrial fibrillation at this time with rapid rate  Heart rate has improved on a Cardizem drip.  Now down into the 120s.  Will continue on Cardizem infusion.  I will consult with cardiology for further evaluation and treatment.  Case discussed with cardiology and medical service.  Will admit for further treatment.        Final Clinical Impression(s) / ED Diagnoses Final diagnoses:  Atrial fibrillation with rapid ventricular response [facility])    Rx / DC Orders ED Discharge Orders     None         Linwood Dibbles, MD 06/19/23 [phone]

## 2023-06-19 ENCOUNTER — Encounter (HOSPITAL_COMMUNITY): Payer: Self-pay | Admitting: Family Medicine

## 2023-06-19 ENCOUNTER — Inpatient Hospital Stay (HOSPITAL_COMMUNITY): Payer: Medicaid Other

## 2023-06-19 ENCOUNTER — Encounter (HOSPITAL_COMMUNITY): Payer: Self-pay

## 2023-06-19 DIAGNOSIS — I4891 Unspecified atrial fibrillation: Secondary | ICD-10-CM

## 2023-06-19 DIAGNOSIS — Z6828 Body mass index (BMI) 28.0-28.9, adult: Secondary | ICD-10-CM | POA: Diagnosis not present

## 2023-06-19 DIAGNOSIS — I1A0 Resistant hypertension: Secondary | ICD-10-CM

## 2023-06-19 DIAGNOSIS — E0590 Thyrotoxicosis, unspecified without thyrotoxic crisis or storm: Secondary | ICD-10-CM | POA: Diagnosis not present

## 2023-06-19 DIAGNOSIS — F419 Anxiety disorder, unspecified: Secondary | ICD-10-CM | POA: Diagnosis present

## 2023-06-19 DIAGNOSIS — Z7984 Long term (current) use of oral hypoglycemic drugs: Secondary | ICD-10-CM | POA: Diagnosis not present

## 2023-06-19 DIAGNOSIS — E876 Hypokalemia: Secondary | ICD-10-CM | POA: Diagnosis present

## 2023-06-19 DIAGNOSIS — I10 Essential (primary) hypertension: Secondary | ICD-10-CM

## 2023-06-19 DIAGNOSIS — E139 Other specified diabetes mellitus without complications: Secondary | ICD-10-CM | POA: Diagnosis present

## 2023-06-19 DIAGNOSIS — Z8249 Family history of ischemic heart disease and other diseases of the circulatory system: Secondary | ICD-10-CM | POA: Diagnosis not present

## 2023-06-19 DIAGNOSIS — Z833 Family history of diabetes mellitus: Secondary | ICD-10-CM | POA: Diagnosis not present

## 2023-06-19 DIAGNOSIS — Z79899 Other long term (current) drug therapy: Secondary | ICD-10-CM | POA: Diagnosis not present

## 2023-06-19 DIAGNOSIS — R634 Abnormal weight loss: Secondary | ICD-10-CM | POA: Diagnosis present

## 2023-06-19 HISTORY — DX: Unspecified atrial fibrillation: I48.91

## 2023-06-19 LAB — CBC
HCT: 32.3 % — ABNORMAL LOW (ref 36.0–46.0)
Hemoglobin: 10.4 g/dL — ABNORMAL LOW (ref 12.0–15.0)
MCH: 26.5 pg (ref 26.0–34.0)
MCHC: 32.2 g/dL (ref 30.0–36.0)
MCV: 82.4 fL (ref 80.0–100.0)
Platelets: 193 10*3/uL (ref 150–400)
RBC: 3.92 MIL/uL (ref 3.87–5.11)
RDW: 12.6 % (ref 11.5–15.5)
WBC: 7.5 10*3/uL (ref 4.0–10.5)
nRBC: 0 % (ref 0.0–0.2)

## 2023-06-19 LAB — HEPATIC FUNCTION PANEL
ALT: 37 U/L (ref 0–44)
AST: 22 U/L (ref 15–41)
Albumin: 3.2 g/dL — ABNORMAL LOW (ref 3.5–5.0)
Alkaline Phosphatase: 81 U/L (ref 38–126)
Bilirubin, Direct: 0.1 mg/dL (ref 0.0–0.2)
Indirect Bilirubin: 0.6 mg/dL (ref 0.3–0.9)
Total Bilirubin: 0.7 mg/dL (ref 0.3–1.2)
Total Protein: 6.5 g/dL (ref 6.5–8.1)

## 2023-06-19 LAB — TROPONIN I (HIGH SENSITIVITY): Troponin I (High Sensitivity): 19 ng/L — ABNORMAL HIGH (ref <18)

## 2023-06-19 LAB — GLUCOSE, CAPILLARY
Glucose-Capillary: 117 mg/dL — ABNORMAL HIGH (ref 70–99)
Glucose-Capillary: 140 mg/dL — ABNORMAL HIGH (ref 70–99)
Glucose-Capillary: 153 mg/dL — ABNORMAL HIGH (ref 70–99)
Glucose-Capillary: 130 mg/dL — ABNORMAL HIGH (ref 70–99)
Glucose-Capillary: 147 mg/dL — ABNORMAL HIGH (ref 70–99)

## 2023-06-19 LAB — BASIC METABOLIC PANEL
Anion gap: 7 (ref 5–15)
BUN: 15 mg/dL (ref 6–20)
CO2: 19 mmol/L — ABNORMAL LOW (ref 22–32)
Calcium: 9 mg/dL (ref 8.9–10.3)
Chloride: 112 mmol/L — ABNORMAL HIGH (ref 98–111)
Creatinine, Ser: 0.44 mg/dL (ref 0.44–1.00)
GFR, Estimated: >60 mL/min (ref >60)
Glucose, Bld: 134 mg/dL — ABNORMAL HIGH (ref 70–99)
Potassium: 3.7 mmol/L (ref 3.5–5.1)
Sodium: 138 mmol/L (ref 135–145)

## 2023-06-19 LAB — TSH
TSH: <0.01 u[IU]/mL — ABNORMAL LOW (ref 0.350–4.500)
TSH: <0.01 u[IU]/mL — ABNORMAL LOW (ref 0.350–4.500)

## 2023-06-19 LAB — T4, FREE: Free T4: 4.47 ng/dL — ABNORMAL HIGH (ref 0.61–1.12)

## 2023-06-19 MED ORDER — AMIODARONE IV BOLUS ONLY 150 MG/100ML: 150.0000 mg | Freq: Once | INTRAVENOUS | Status: DC

## 2023-06-19 MED ORDER — AMIODARONE LOAD VIA INFUSION
150.0000 mg | Freq: Once | INTRAVENOUS | Status: DC
  Filled 2023-06-19: qty 83.34

## 2023-06-19 MED ORDER — AMIODARONE HCL IN DEXTROSE 360-4.14 MG/200ML-% IV SOLN
60.0000 mg/h | INTRAVENOUS | Status: DC
  Filled 2023-06-19: qty 200

## 2023-06-19 MED ORDER — ACETAMINOPHEN 650 MG RE SUPP: 650.0000 mg | Freq: Four times a day (QID) | RECTAL | Status: DC | PRN

## 2023-06-19 MED ORDER — POTASSIUM CHLORIDE CRYS ER 20 MEQ PO TBCR
40.0000 meq | EXTENDED_RELEASE_TABLET | Freq: Once | ORAL | Status: AC
  Administered 2023-06-19: 40 meq via ORAL
  Filled 2023-06-19: qty 2

## 2023-06-19 MED ORDER — ATORVASTATIN CALCIUM 20 MG PO TABS
20.0000 mg | ORAL_TABLET | Freq: Every day | ORAL | Status: DC
  Administered 2023-06-19 – 2023-06-20 (×2): 20 mg via ORAL
  Filled 2023-06-19 (×2): qty 1

## 2023-06-19 MED ORDER — METHIMAZOLE 5 MG PO TABS
5.0000 mg | ORAL_TABLET | Freq: Three times a day (TID) | ORAL | Status: DC
  Administered 2023-06-19 – 2023-06-20 (×4): 5 mg via ORAL
  Filled 2023-06-19 (×5): qty 1

## 2023-06-19 MED ORDER — INSULIN ASPART 100 UNIT/ML IJ SOLN
0.0000 [IU] | Freq: Three times a day (TID) | INTRAMUSCULAR | Status: DC
  Administered 2023-06-19: 2 [IU] via SUBCUTANEOUS
  Administered 2023-06-19 (×2): 1 [IU] via SUBCUTANEOUS
  Filled 2023-06-19: qty 0.09

## 2023-06-19 MED ORDER — POTASSIUM CHLORIDE IN NACL 20-0.9 MEQ/L-% IV SOLN
INTRAVENOUS | Status: DC
Start: 2023-06-19 — End: 2023-06-19

## 2023-06-19 MED ORDER — ORAL CARE MOUTH RINSE: 15.0000 mL | OROMUCOSAL | Status: DC | PRN

## 2023-06-19 MED ORDER — ACETAMINOPHEN 325 MG PO TABS
650.0000 mg | ORAL_TABLET | Freq: Four times a day (QID) | ORAL | Status: DC | PRN
  Administered 2023-06-19 – 2023-06-20 (×2): 650 mg via ORAL
  Filled 2023-06-19 (×2): qty 2

## 2023-06-19 MED ORDER — MORPHINE SULFATE (PF) 2 MG/ML IV SOLN: 0.5000 mg | INTRAVENOUS | Status: AC | PRN

## 2023-06-19 MED ORDER — INSULIN ASPART 100 UNIT/ML IJ SOLN
0.0000 [IU] | Freq: Every day | INTRAMUSCULAR | Status: DC
  Filled 2023-06-19: qty 0.05

## 2023-06-19 MED ORDER — SENNOSIDES-DOCUSATE SODIUM 8.6-50 MG PO TABS: 1.0000 | ORAL_TABLET | Freq: Every evening | ORAL | Status: DC | PRN

## 2023-06-19 MED ORDER — METOPROLOL TARTRATE 25 MG PO TABS
12.5000 mg | ORAL_TABLET | Freq: Two times a day (BID) | ORAL | Status: DC
  Administered 2023-06-19 – 2023-06-20 (×3): 12.5 mg via ORAL
  Filled 2023-06-19 (×3): qty 1

## 2023-06-19 MED ORDER — LISINOPRIL 20 MG PO TABS: 20.0000 mg | ORAL_TABLET | Freq: Every day | ORAL | Status: DC

## 2023-06-19 MED ORDER — HYDROCHLOROTHIAZIDE 25 MG PO TABS: 25.0000 mg | ORAL_TABLET | Freq: Every day | ORAL | Status: DC

## 2023-06-19 MED ORDER — AMIODARONE HCL IN DEXTROSE 360-4.14 MG/200ML-% IV SOLN: 30.0000 mg/h | INTRAVENOUS | Status: DC

## 2023-06-19 NOTE — H&P (Signed)
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  Patient primarily Spanish-speaking.  Today around noon she developed palpitations that did not go away.  Patient has had it twice before but those resolved.  She had difficulty breathing and was very sleepy.  She endorses chest pressure but no pain.  She came to the ER.  Patient was not ill prior, she denies nausea, vomiting, fevers or chills.  Patient was not exposed to the heat.  In the ER patient was found to be in A-fib with RVR.  She was started on a diltiazem drip and Eliquis.  Admission requested.  Review of Systems:  Per HPI  Past Medical History: Past Medical History:  Diagnosis Date   Diabetes 1.5, managed as type 2 (HCC)    Hypertension    Past Surgical History: None  Medications: Prior to Admission medications   Medication Sig Start Date End Date Taking? Authorizing Provider  atorvastatin (LIPITOR) 20 MG tablet Take 1 tablet (20 mg total) by mouth daily. 06/12/23   Grayce Sessions, NP  diclofenac Sodium (VOLTAREN) 1 % GEL Apply 4 grams topically 4 (four) times daily. 06/12/23   Grayce Sessions, NP  fluticasone (FLONASE) 50 MCG/ACT nasal spray Place 2 sprays into both nostrils daily. 06/12/23   Grayce Sessions, NP  guaiFENesin 200 MG tablet Take 1 tablet (200 mg total) by mouth every 4 (four) hours as needed for cough or to loosen phlegm. Patient not taking: Reported on 04/09/2023 01/06/23   Grayce Sessions, NP  hydrochlorothiazide (HYDRODIURIL) 25 MG tablet Take 1 tablet (25 mg total) by mouth daily. 06/12/23   Grayce Sessions, NP  ibuprofen (ADVIL) 800 MG tablet Take 1 tablet (800 mg total) by mouth every 8 (eight) hours as needed for moderate pain. 06/10/23   Grayce Sessions, NP  lisinopril (ZESTRIL) 20 MG tablet Take 1 tablet (20 mg total) by mouth daily. 06/12/23   Grayce Sessions, NP   loratadine (CLARITIN) 10 MG tablet Take 1 tablet (10 mg total) by mouth daily. 06/12/23   Grayce Sessions, NP  metFORMIN (GLUCOPHAGE-XR) 500 MG 24 hr tablet Take 1 tablet (500 mg total) by mouth 2 (two) times daily. 06/12/23   Grayce Sessions, NP    Allergies:  No Known Allergies  Social History:  reports that she has never smoked. She has never used smokeless tobacco. She reports that she does not drink alcohol and does not use drugs.  Family History: Family History  Problem Relation Age of Onset   Diabetes Mother    Diabetes Father    Hypertension Father    Diabetes Sister    Diabetes Sister    Breast cancer Neg Hx     Physical Exam: Vitals:   06/19/23 0010 06/19/23 0030 06/19/23 0045 06/19/23 0059  BP:  110/67    Pulse: (!) 151 77 96   Resp: (!) 27 (!) 31 (!) 22   Temp:    98.5 F (36.9 C)  TempSrc:    Oral  SpO2: 99% 97% 98%   Weight:      Height:        General:  Alert and oriented times three, well developed and nourished, no acute distress Eyes: PERRLA, pink conjunctiva, no scleral icterus ENT: Moist oral mucosa, neck supple, no thyromegaly Lungs: CTA B/L, no wheeze, no crackles, no use  of accessory muscles Cardiovascular: RRR, no regurgitation, no gallops, no murmurs. No carotid bruits, no JVD Abdomen: soft, positive BS, non-tender, non-distended, no organomegaly, not an acute abdomen GU: not examined Neuro: CN II - XII grossly intact, sensation intact Musculoskeletal: strength 5/5 all extremities, no clubbing, cyanosis or edema Skin: no rash, no subcutaneous crepitation, no decubitus Psych: appropriate patient   Labs on Admission:  Recent Labs    06/18/23 2137  NA 134*  K 3.1*  CL 107  CO2 19*  GLUCOSE 201*  BUN 20  CREATININE 0.59  CALCIUM 8.9  MG 1.9    Recent Labs    06/18/23 2137  WBC 12.4*  HGB 11.8*  HCT 35.5*  MCV 80.7  PLT 218    Assessment/Plan Present on Admission:  Atrial fibrillation with rapid ventricular response  (HCC) -Patient converted to NSR on the floor.  Dilt drip discontinued.  Metoprolol 12.5 mg p.o. twice daily ordered, first dose now -P.O. Eliquis started.  [place] score>2.  Continued. -2D echo in a.m.  TSH ordered -Social service consult placed for medication assistance -Patient converted to normal sinus rhythm on the floor. -Cardiology consult placed  Hypokalemia -repleting, BMP in a.m.  Diabetes mellitus type 1 .5 -SSI initiated  HTN -HCTZ, lisinopril on hold, patient with some borderline blood pressures on dill drip..  May need to decrease lisinopril dose with metoprolol.   Conlee Sliter 06/19/2023, 1:23 AM

## 2023-06-19 NOTE — Progress Notes (Signed)
Patient refused exam. Eating lunch  Dondra Prader RVT RCS

## 2023-06-19 NOTE — Progress Notes (Signed)
PROGRESS NOTE    Susan [name]  WUJ:[id number] DOB: 06-10-1976 DOA: 06/18/2023 PCP: Grayce Sessions, NP   Brief Narrative: 47 year old with past medical history significant for diabetes type 1.5 hypertension presents complaining of palpitation, reports 2 prior episodes resolved on its own.  Associated with shortness of breath, she became very sleepy.  She relates chest pressure.  In the ED she was found to be in A-fib with RVR.  She is was started on Cardizem drip and Eliquis.   Assessment & Plan:   Principal Problem:   Atrial fibrillation with rapid ventricular response (HCC) Active Problems:   Diabetes mellitus type 1.5 (HCC)   HTN (hypertension)  1-A-fib with RVR: in setting hyperthyroidism.  CHADS2 score more than 2 Continue with Eliquis Patient converted to sinus rhythm, diltiazem drip was discontinued.  She was started on metoprolol. Check  ECHO Suspect in setting Hyperthyroidism.   Hyperthyroidism.  Abnormal TSH She report weight loss, Diaphoresis, mild tremors.  Repeat Level, check Free T and T 4 Free T4: at 4.  Start Methimazole.  Check LFT.  Needs follow up with endocrinologist Check thyroid US.    Hypokalemia: Replaced.  Mg 1.9   Diabetes type 1 0.5: On Metformin at home SSI while in the hospital.   Hypertension: Hold Lisinopril and hydrochlorothiazide, BP soft. Started on Metoprolol.       Estimated body mass index is 28.56 kg/m as calculated from the following:   Height as of this encounter: 4\' 9"  (1.448 m).   Weight as of this encounter: 59.9 kg.   DVT prophylaxis: Eliquis  Code Status: Full code Family Communication: Care discussed with patient.  Disposition Plan:  Status is: Inpatient Remains inpatient appropriate because: management of hyperthyroidism     Consultants:  None  Procedures:    Antimicrobials:    Subjective: She report weight loss, tremors, mild anxiety, sweating.  Denies chest pain   Objective: Vitals:   06/19/23 0100 06/19/23 0130 06/19/23 0238 06/19/23 0618  BP: 100/61 (!) 102/56 111/65   Pulse: (!) 101 69 86   Resp: 20 (!) 32 16 20  Temp:   98.8 F (37.1 C)   TempSrc:   Oral   SpO2: 98% 97% 99%   Weight:      Height:        Intake/Output Summary (Last 24 hours) at 06/19/2023 0726 Last data filed at 06/19/2023 0433 Gross per 24 hour  Intake 1506.34 ml  Output --  Net 1506.34 ml   Filed Weights   06/18/23 2143  Weight: 59.9 kg    Examination:  General exam: Appears calm and comfortable  Respiratory system: Clear to auscultation. Respiratory effort normal. Cardiovascular system: S1 & S2 heard, RRR. No JVD, murmurs, rubs, gallops or clicks. No pedal edema. Gastrointestinal system: Abdomen is nondistended, soft and nontender. No organomegaly or masses felt. Normal bowel sounds heard. Central nervous system: Alert and oriented. No focal neurological deficits. Extremities: Symmetric 5 x 5 power.   Data Reviewed: I have personally reviewed following labs and imaging studies  CBC: Recent Labs  Lab 06/18/23 2137 06/19/23 0358  WBC 12.4* 7.5  HGB 11.8* 10.4*  HCT 35.5* 32.3*  MCV 80.7 82.4  PLT 218 193   Basic Metabolic Panel: Recent Labs  Lab 06/12/23 1109 06/18/23 2137 06/19/23 0358  NA 140 134* 138  K 4.4 3.1* 3.7  CL 105 107 112*  CO2 22 19* 19*  GLUCOSE 126* 201* 134*  BUN 15 20 15   CREATININE 0.43*  0.59 0.44  CALCIUM 9.9 8.9 9.0  MG  --  1.9  --    GFR: Estimated Creatinine Clearance: 63.9 mL/min (by C-G formula based on SCr of 0.44 mg/dL). Liver Function Tests: Recent Labs  Lab 06/12/23 1109  AST 35  ALT 61*  ALKPHOS 119  BILITOT 0.3  PROT 7.3  ALBUMIN 4.2   No results for input(s): "LIPASE", "AMYLASE" in the last 168 hours. No results for input(s): "AMMONIA" in the last 168 hours. Coagulation Profile: No results for input(s): "INR", "PROTIME" in the last 168 hours. Cardiac Enzymes: No results for input(s):  "CKTOTAL", "CKMB", "CKMBINDEX", "TROPONINI" in the last 168 hours. BNP (last 3 results) No results for input(s): "PROBNP" in the last 8760 hours. HbA1C: No results for input(s): "HGBA1C" in the last 72 hours. CBG: Recent Labs  Lab 06/19/23 0249  GLUCAP 117*   Lipid Profile: No results for input(s): "CHOL", "HDL", "LDLCALC", "TRIG", "CHOLHDL", "LDLDIRECT" in the last 72 hours. Thyroid Function Tests: Recent Labs    06/19/23 0358  TSH <0.010*   Anemia Panel: No results for input(s): "VITAMINB12", "FOLATE", "FERRITIN", "TIBC", "IRON", "RETICCTPCT" in the last 72 hours. Sepsis Labs: No results for input(s): "PROCALCITON", "LATICACIDVEN" in the last 168 hours.  No results found for this or any previous visit (from the past 240 hour(s)).       Radiology Studies: No results found.      Scheduled Meds:  apixaban  5 mg Oral BID   atorvastatin  20 mg Oral Daily   hydrochlorothiazide  25 mg Oral Daily   insulin aspart  0-5 Units Subcutaneous QHS   insulin aspart  0-9 Units Subcutaneous TID WC   lisinopril  20 mg Oral Daily   metoprolol tartrate  12.5 mg Oral BID   Continuous Infusions:  sodium chloride 125 mL/hr at 06/19/23 0433     LOS: 0 days    Time spent: 35 minutes.     Alba Cory, MD [facility]   If 7PM-7AM, please contact night-coverage [email or web address]  06/19/2023, 7:26 AM

## 2023-06-19 NOTE — Discharge Instructions (Addendum)
°Information del medicamento  ELIQUIS® (apixaban) °¿POR QUÉ ESTOY TOMANDO APIXABÁN (ELIQUIS®)? °Su médico le ha recomendado que tome el medicamento apixabán (ELIQUIS®). °Este medicamento es un anticoagulante; “anti” significa contra y “coagulant” significa coagulante, así que es un medicamento que previene los coágulos sanguíneos. A veces, a este medicamento se le llama diluyente sanguíneo. Un anticoagulante ayuda a prevenir la formación de coágulos en la sangre. ° °Mientras toma este medicamento, es necesario mantenerle bajo estrecha supervisión para detectar hemorragias y moretones. Su proveedor de atención médica colaborará con usted para mantenerle seguro y sano mientras toma apixabán (ELIQUIS®). Por favor, °notifíquele a su proveedor de atención médica si tiene alguna pregunta.  ° °Entre más sepan sobre este medicamento mejor equipo harán usted y su proveedor de atención médica. Le rogamos que se tome su tiempo para leer toda la información en este folleto. ° °¿CÓMO SE DEBE TOMAR EL APIXABÁN (ELIQUIS® )? °• Tome su medicamento exactamente como se le recete. El apixabán (ELIQUIS®) se toma dos veces al día a la misma hora todos los días. °• El medicamento puede tomarse con o sin comida. °• Los comprimidos pueden triturarse si tiene dificultades para tragárselos enteros. °• Si se le olvida tomar una dosis, tómesela en cuanto lo recuerde. No tome más de una dosis al mismo tiempo para compensar la dosis olvidada. ° °¿PUEDO TOMAR APIXABÁN (ELIQUIS® ) CON OTROS MEDICAMENTOS? °Es importante hablar con su médico o farmacéutico acerca de todos los demás medicamentos que esté tomando, incluidos los °medicamentos de venta libre, los antibióticos, las vitaminas o los suplementos de hierbas.  ° °Podría tener mayor riesgo de hemorragia si toma otros medicamentos como: °• aspirina o productos que contengan aspirina. °• medicamentos antiinflamatorios no esteroides (NSAIDS): ibuprofeno (ADVIL®) o naproxén (ALEVE®). °• otro  medicamento para ayudar a prevenir los coágulos sanguíneos como la warfarina (COUMADIN® ) o el clopidogrel (PLAVIX®). ° °¿CUÁLES SON LOS EFECTOS SECUNDARIOS DEL APIXABÁN (ELIQUIS®)? °• pequeños moretones. °• ligero sangrado de las encías al cepillarse los dientes. °• sangrado ocasional de la nariz. °• sangrado después de una cortadura menor que para en unos minutos. ° °¿CUÁNDO DEBO COMUNICARME CON MI PROVEEDOR MÉDICO PARA RECIBIR ATENCIÓN MÉDICA? °Si está sintiendo algo anormal que usted piensa que podría estar causado por el apixabán (ELIQUIS® ), por favor comuníquese con su proveedor de atención médica. ° °Algunas cosas a las que debe estar alerta incluyen: °• hemorragia mayor. °• orina de color rojo, oscuro o café. °• heces de color rojo o alquitranado. °• hemorragia que no para después de 10 minutos. °• vómito que es de color café o rojo brillante. ° °Otras inquietudes podrían incluir: °• dolor inesperado, hinchazón o dolor en las articulaciones. °• dolores de cabeza o debilidad y mareos. °• una fuerte caída o un golpe en la cabeza. °• si está embarazada o planea quedar embarazada. ° °SI SUFRE ALGUNA HEMORRAGIA MAYOR COMUNÍQUESE DE INMEDIATO CON SU MÉDICO O ACUDA A LA SALA DE EMERGENCIAS DEL HOSPITAL ° °¿PUEDO DEJAR DE TOMAR APIXABÁN (ELIQUIS®)? °No deje de tomar apixabán (ELIQUIS®) salvo que su proveedor de atención médica le diga que lo haga. °El apixabán (ELIQUIS®) posiblemente necesite dejarse de tomar antes de un procedimiento o cirugía. Pídale a su proveedor instrucciones °específicas sobre cuándo dejar de tomar el medicamento y cuando volver a tomárselo.  ° °Si desea o necesita dejar de tomar este medicamento por cualquier razón, comuníquese de inmediato con su proveedor de atención °médica antes de dejar de hacerlo. ° °DEJAR DE TOMAR ESTE MEDICAMENTO PODRÍA AUMENTAR SU RIESGO   DE TENER UN COÁGULO SANGUÍNEO.  ° °¿NECESITARÉ UN ANÁLISIS DE SANGRE REGULAR? °Es posible que su proveedor de atención médica  realice análisis de sangre antes de iniciar el medicamento para que le ayuden a encontrar la dosis correcta. Necesitará realizarse estos análisis de sangre por lo menos una vez al año.  ° °El apixabán (ELIQUIS® ) no requiere el control mensual de sangre como algunos otros anticoagulantes. ° °¿NECESITO CAMBIAR MI DIETA? °No hay restricciones dietéticas que considerar mientras toma apixabán (ELIQUIS®) ° ° °

## 2023-06-19 NOTE — TOC Progression Note (Signed)
Transition of Care [facility]) - Progression Note    Patient Details  Name: Susan [name] MRN: [id number] Date of Birth: [birth year]  [facility] CM/SW Contact  Larrie Kass, LCSW Phone Number: 06/19/2023, 10:24 AM  Clinical Narrative:     CSW received a medication assistance consult for Eliquis, CSW messaged pharmacy, and they will provide education and a coupon. Pt is uninsured and may qualify for MATCH if needed at discharge.TOC to follow.    Expected Discharge Plan: Home/Self Care Barriers to Discharge: Continued Medical Work up  Expected Discharge Plan and Services                                               Social Determinants of Health (SDOH) Interventions SDOH Screenings   Food Insecurity: Food Insecurity Present (04/09/2023)  Transportation Needs: Unmet Transportation Needs (04/09/2023)  Depression (PHQ2-9): Low Risk  (06/12/2023)  Tobacco Use: Low Risk  (06/12/2023)    Readmission Risk Interventions     No data to display

## 2023-06-19 NOTE — ED Notes (Signed)
ED TO INPATIENT HANDOFF REPORT  ED Nurse Name and Phone #: Claiborne Rigg Name/Age/Gender Susan [name] 47 y.o. female Room/Bed: RESA/RESA  Code Status   Code Status: Full Code  Home/SNF/Other Home Patient oriented to: self, place, time, and situation Is this baseline? Yes   Triage Complete: Triage complete  Chief Complaint Atrial fibrillation with rapid ventricular response (HCC) [I48.91]  Triage Note C/o rapid heart rate and chest pressure since midnight. Hx htn and dm   Allergies No Known Allergies  Level of Care/Admitting Diagnosis ED Disposition     ED Disposition  Admit   Condition  --   Comment  Hospital Area: [facility] [[id number]]  Level of Care: Progressive [102]  Admit to Progressive based on following criteria: CARDIOVASCULAR & THORACIC of moderate stability with acute coronary syndrome symptoms/low risk myocardial infarction/hypertensive urgency/arrhythmias/heart failure potentially compromising stability and stable post cardiovascular intervention patients.  May admit patient to [facility] or [facility] if equivalent level of care is available:: Yes  Covid Evaluation: Confirmed COVID Negative  Diagnosis: Atrial fibrillation with rapid ventricular response [facility]) [[id number]]  Admitting Physician: Gery Pray [[id number]]  Attending Physician: Gery Pray [phone]  Certification:: I certify this patient will need inpatient services for at least 2 midnights  Estimated Length of Stay: 2          B Medical/Surgery History Past Medical History:  Diagnosis Date   Diabetes 1.5, managed as type 2 (HCC)    Hypertension    No past surgical history on file.   A IV Location/Drains/Wounds Patient Lines/Drains/Airways Status     Active Line/Drains/Airways     Name Placement date Placement time Site Days   Peripheral IV 06/18/23 20 G 1" Right Antecubital 06/18/23  2125  Antecubital  1   Peripheral IV 06/18/23 18 G  Left Antecubital 06/18/23  2137  Antecubital  1            Intake/Output Last 24 hours  Intake/Output Summary (Last 24 hours) at 06/19/2023 0031 Last data filed at 06/18/2023 2203 Gross per 24 hour  Intake 1000 ml  Output --  Net 1000 ml    Labs/Imaging Results for orders placed or performed during the hospital encounter of 06/18/23 (from the past 48 hour(s))  CBC     Status: Abnormal   Collection Time: 06/18/23  9:37 PM  Result Value Ref Range   WBC 12.4 (H) 4.0 - 10.5 K/uL   RBC 4.40 3.87 - 5.11 MIL/uL   Hemoglobin 11.8 (L) 12.0 - 15.0 g/dL   HCT 09.8 (L) 11.9 - 14.7 %   MCV 80.7 80.0 - 100.0 fL   MCH 26.8 26.0 - 34.0 pg   MCHC 33.2 30.0 - 36.0 g/dL   RDW 82.9 56.2 - 13.0 %   Platelets 218 150 - 400 K/uL   nRBC 0.0 0.0 - 0.2 %    Comment: Performed at [facility], [address], [place], [place] [address]  Basic metabolic panel     Status: Abnormal   Collection Time: 06/18/23  9:37 PM  Result Value Ref Range   Sodium 134 (L) 135 - 145 mmol/L   Potassium 3.1 (L) 3.5 - 5.1 mmol/L   Chloride 107 98 - 111 mmol/L   CO2 19 (L) 22 - 32 mmol/L   Glucose, Bld 201 (H) 70 - 99 mg/dL    Comment: Glucose reference range applies only to samples taken after fasting for at least 8 hours.  BUN 20 6 - 20 mg/dL   Creatinine, Ser 8.65 0.44 - 1.00 mg/dL   Calcium 8.9 8.9 - 78.4 mg/dL   GFR, Estimated >69 >62 mL/min    Comment: (NOTE) Calculated using the CKD-EPI Creatinine Equation (2021)    Anion gap 8 5 - 15    Comment: Performed at [facility], [address] [place], [place] [address]  Magnesium     Status: None   Collection Time: 06/18/23  9:37 PM  Result Value Ref Range   Magnesium 1.9 1.7 - 2.4 mg/dL    Comment: Performed at [facility], [address], [place], [place] [address]  Troponin I (High Sensitivity)     Status: Abnormal   Collection Time: 06/18/23 10:04 PM  Result Value Ref Range   Troponin I (High  Sensitivity) 26 (H) <18 ng/L    Comment: (NOTE) Elevated high sensitivity troponin I (hsTnI) values and significant  changes across serial measurements may suggest ACS but many other  chronic and acute conditions are known to elevate hsTnI results.  Refer to the "Links" section for chest pain algorithms and additional  guidance. Performed at [facility], [address] [place], [place] [address]    No results found.  Pending Labs Unresulted Labs (From admission, onward)     Start     Ordered   06/19/23 0500  Basic metabolic panel  Tomorrow morning,   R        06/19/23 0007   06/19/23 0500  CBC  Tomorrow morning,   R        06/19/23 0007   06/19/23 0500  TSH  Tomorrow morning,   R        06/19/23 0007            Vitals/Pain Today's Vitals   06/18/23 2300 06/18/23 2330 06/19/23 0005 06/19/23 0010  BP: 111/81 114/61    Pulse: (!) 113 90 (!) 142 (!) 151  Resp: (!) 26 20 19  (!) 27  Temp:      TempSrc:      SpO2: 98% 97% 97% 99%  Weight:      Height:      PainSc:        Isolation Precautions No active isolations  Medications Medications  sodium chloride 0.9 % bolus 1,000 mL (0 mLs Intravenous Stopped 06/18/23 2203)    Followed by  0.9 %  sodium chloride infusion (1,000 mLs Intravenous New Bag/Given 06/19/23 0005)  diltiazem (CARDIZEM) 1 mg/mL load via infusion 20 mg (20 mg Intravenous Not Given 06/18/23 2151)    And  diltiazem (CARDIZEM) 125 mg in dextrose 5% 125 mL (1 mg/mL) infusion (12.5 mg/hr Intravenous Rate/Dose Change 06/19/23 0009)  apixaban (ELIQUIS) tablet 5 mg (5 mg Oral Given 06/19/23 0002)  acetaminophen (TYLENOL) tablet 650 mg (has no administration in time range)    Or  acetaminophen (TYLENOL) suppository 650 mg (has no administration in time range)  senna-docusate (Senokot-S) tablet 1 tablet (has no administration in time range)  insulin aspart (novoLOG) injection 0-9 Units (has no administration in time range)  insulin aspart  (novoLOG) injection 0-5 Units (has no administration in time range)  adenosine (ADENOCARD) 6 MG/2ML injection 6 mg (6 mg Intravenous Given 06/18/23 2133)  diltiazem (CARDIZEM) 25 MG/5ML injection (20 mg Intravenous Given 06/18/23 2140)  potassium chloride SA (KLOR-CON M) CR tablet 40 mEq (40 mEq Oral Given 06/19/23 0003)    Mobility Walks      Focused  Assessments Cardiac Assessment Handoff:  Pt came in with palpitations since midnight last night. HR was 198 in triage. EKG showed SVT - 6 mg adenosine given with pause but HR remained elevated. EKG obtained - showed Afib RVR. 20mg  cardizem given and drip initiated.    R Recommendations: See Admitting Provider Note  Report given to:   Additional Notes: n/a

## 2023-06-19 NOTE — Plan of Care (Signed)
  Problem: Education: Goal: Knowledge of General Education information will improve Description: Including pain rating scale, medication(s)/side effects and non-pharmacologic comfort measures Outcome: Progressing   Problem: Clinical Measurements: Goal: Diagnostic test results will improve Outcome: Progressing Goal: Cardiovascular complication will be avoided Outcome: Progressing   Problem: Activity: Goal: Risk for activity intolerance will decrease Outcome: Progressing   Problem: Nutrition: Goal: Adequate nutrition will be maintained Outcome: Progressing   Problem: Elimination: Goal: Will not experience complications related to bowel motility Outcome: Progressing   Problem: Pain Managment: Goal: General experience of comfort will improve Outcome: Progressing

## 2023-06-20 ENCOUNTER — Inpatient Hospital Stay (HOSPITAL_COMMUNITY): Payer: Medicaid Other

## 2023-06-20 DIAGNOSIS — I4891 Unspecified atrial fibrillation: Secondary | ICD-10-CM

## 2023-06-20 LAB — BASIC METABOLIC PANEL
Anion gap: 6 (ref 5–15)
BUN: 13 mg/dL (ref 6–20)
CO2: 18 mmol/L — ABNORMAL LOW (ref 22–32)
Calcium: 9 mg/dL (ref 8.9–10.3)
Chloride: 113 mmol/L — ABNORMAL HIGH (ref 98–111)
Creatinine, Ser: 0.35 mg/dL — ABNORMAL LOW (ref 0.44–1.00)
GFR, Estimated: >60 mL/min (ref >60)
Glucose, Bld: 144 mg/dL — ABNORMAL HIGH (ref 70–99)
Potassium: 3.6 mmol/L (ref 3.5–5.1)
Sodium: 137 mmol/L (ref 135–145)

## 2023-06-20 LAB — CBC
HCT: 32.9 % — ABNORMAL LOW (ref 36.0–46.0)
Hemoglobin: 10.5 g/dL — ABNORMAL LOW (ref 12.0–15.0)
MCH: 26.7 pg (ref 26.0–34.0)
MCHC: 31.9 g/dL (ref 30.0–36.0)
MCV: 83.7 fL (ref 80.0–100.0)
Platelets: 154 10*3/uL (ref 150–400)
RBC: 3.93 MIL/uL (ref 3.87–5.11)
RDW: 12.4 % (ref 11.5–15.5)
WBC: 7.5 10*3/uL (ref 4.0–10.5)
nRBC: 0 % (ref 0.0–0.2)

## 2023-06-20 LAB — GLUCOSE, CAPILLARY
Glucose-Capillary: 106 mg/dL — ABNORMAL HIGH (ref 70–99)
Glucose-Capillary: 150 mg/dL — ABNORMAL HIGH (ref 70–99)

## 2023-06-20 LAB — ECHOCARDIOGRAM COMPLETE
AR max vel: 1.94 cm2
AV Area VTI: 2.18 cm2
AV Area mean vel: 2.13 cm2
AV Mean grad: 10 mmHg
AV Peak grad: 21.2 mmHg
Ao pk vel: 2.3 m/s
Area-P 1/2: 3.37 cm2
Calc EF: 66.6 %
Height: 57 in
MV VTI: 2.39 cm2
P 1/2 time: 294 msec
S' Lateral: 2.7 cm
Single Plane A2C EF: 67.1 %
Single Plane A4C EF: 66.6 %
Weight: 2112 oz

## 2023-06-20 MED ORDER — APIXABAN 5 MG PO TABS: 5.0000 mg | ORAL_TABLET | Freq: Two times a day (BID) | ORAL | 1 refills | Status: DC

## 2023-06-20 MED ORDER — METOPROLOL TARTRATE 25 MG PO TABS: 12.5000 mg | ORAL_TABLET | Freq: Two times a day (BID) | ORAL | 0 refills | Status: DC

## 2023-06-20 MED ORDER — METHIMAZOLE 5 MG PO TABS: 5.0000 mg | ORAL_TABLET | Freq: Three times a day (TID) | ORAL | 0 refills | Status: DC

## 2023-06-20 NOTE — Discharge Summary (Signed)
Physician Discharge Summary   Patient: Susan [name] MRN: [id number] DOB: October 28, 1976  Admit date:     06/18/2023  Discharge date: 06/20/23  Discharge Physician: Alba Cory   PCP: Grayce Sessions, NP   Recommendations at discharge:   Needs follow up with A fib clinic. To discussed further anticoagulation.  Needs follow up with Endocrinologist for further care and treatment option for Hyperthyroidism.  Follow up on BP  Discharge Diagnoses: Principal Problem:   Atrial fibrillation with rapid ventricular response (HCC) Active Problems:   Diabetes mellitus type 1.5 (HCC)   HTN (hypertension)  Resolved Problems:   * No resolved hospital problems. *  Hospital Course: 47 year old with past medical history significant for diabetes type 1.5 hypertension presents complaining of palpitation, reports 2 prior episodes resolved on its own.  Associated with shortness of breath, she became very sleepy.  She relates chest pressure.  In the ED she was found to be in A-fib with RVR.  She is was started on Cardizem drip and Eliquis. Subsequently she was transition off Cardizem gtt to metoprolol oral BID> She was lalso found to have hyperthyroidism. Started on Methimazole.    Assessment and Plan: 1-A-fib with RVR: in setting hyperthyroidism.  CHADS2 score more than 2 Continue with Eliquis Patient converted to sinus rhythm, diltiazem drip was discontinued.  She was started on metoprolol. ECHO; pending, discharge home today if echo normal.  Suspect in setting Hyperthyroidism.    Hyperthyroidism.  -Abnormal TSH -She report weight loss, Diaphoresis, mild tremors.  -Free T4: at 4. Free T 3: pending -Started  Methimazole. Low dose 5 mg TID. Explain to patient to avoid pregnancy.  -LFT normal.   -Needs follow up with endocrinologist -Thyroid US. :  Diffusely enlarged and heterogeneous thyroid gland. Findings suggest either chronic thyroiditis or diffuse goitrous change. Two  small nodules are noted incidentally, 1 in the right inferior gland in 1 in the left inferior gland. Neither nodule meets criteria to warrant biopsy or dedicated imaging follow-up. No follow-up recommended. -Will order Thyroglobulin antibody.      Hypokalemia: Replaced.  Mg 1.9     Diabetes type 1 0.5: On Metformin at home SSI while in the hospital.    Hypertension: Hold Lisinopril and hydrochlorothiazide, BP soft. Started on Metoprolol.               Consultants: None Procedures performed: ECHO Disposition: Home Diet recommendation:  Cardiac diet DISCHARGE MEDICATION: Allergies as of 06/20/2023   No Known Allergies      Medication List     STOP taking these medications    diclofenac Sodium 1 % Gel Commonly known as: Voltaren   fluticasone 50 MCG/ACT nasal spray Commonly known as: FLONASE   guaiFENesin 200 MG tablet   hydrochlorothiazide 25 MG tablet Commonly known as: HYDRODIURIL   ibuprofen 800 MG tablet Commonly known as: ADVIL   lisinopril 20 MG tablet Commonly known as: ZESTRIL   loratadine 10 MG tablet Commonly known as: CLARITIN       TAKE these medications    apixaban 5 MG Tabs tablet Commonly known as: ELIQUIS Take 1 tablet (5 mg total) by mouth 2 (two) times daily.   atorvastatin 20 MG tablet Commonly known as: LIPITOR Take 1 tablet (20 mg total) by mouth daily.   metFORMIN 500 MG 24 hr tablet Commonly known as: GLUCOPHAGE-XR Take 1 tablet (500 mg total) by mouth 2 (two) times daily.   methimazole 5 MG tablet Commonly known as: TAPAZOLE Take  1 tablet (5 mg total) by mouth 3 (three) times daily.   metoprolol tartrate 25 MG tablet Commonly known as: LOPRESSOR Take 0.5 tablets (12.5 mg total) by mouth 2 (two) times daily.        Discharge Exam: Filed Weights   06/18/23 2143  Weight: 59.9 kg   General NAD  Condition at discharge: stable  The results of significant diagnostics from this hospitalization (including  imaging, microbiology, ancillary and laboratory) are listed below for reference.   Imaging Studies: US THYROID  Result Date: 06/20/2023 CLINICAL DATA:  Other.  Low follicular stimulating hormone EXAM: THYROID ULTRASOUND TECHNIQUE: Ultrasound examination of the thyroid gland and adjacent soft tissues was performed. COMPARISON:  None Available. FINDINGS: Parenchymal Echotexture: Moderately heterogenous Isthmus: 0.9 cm Right lobe: 3.9 x 3.0 x 2.4 cm Left lobe: 3.1 x 1.8 x 2.2 cm _________________________________________________________ Estimated total number of nodules >/= 1 cm: 1 Number of spongiform nodules >/=  2 cm not described below (TR1): 0 Number of mixed cystic and solid nodules >/= 1.5 cm not described below (TR2): 0 _________________________________________________________ Diffusely heterogeneous and enlarged thyroid gland. Vascularity is within normal limits on color Doppler imaging. No significant hypervascularity. Nodule # 1: Ill-defined solid hypoechoic nodule within the right lower gland measures 1.1 x 0.9 x 1.2 cm. Findings are consistent with TI-RADS category 3. Given size (<1.4 cm) and appearance, this nodule does NOT meet TI-RADS criteria for biopsy or dedicated follow-up. Nodule # 2: Small hyperechoic nodule in the left lower gland measures 1.1 x 0.9 x 0.9 cm. Margins are ill-defined. No suspicious features. Findings are consistent with TI-RADS category 3. Given size (<1.4 cm) and appearance, this nodule does NOT meet TI-RADS criteria for biopsy or dedicated follow-up. IMPRESSION: 1. Diffusely enlarged and heterogeneous thyroid gland. Findings suggest either chronic thyroiditis or diffuse goitrous change. 2. Two small nodules are noted incidentally, 1 in the right inferior gland in 1 in the left inferior gland. Neither nodule meets criteria to warrant biopsy or dedicated imaging follow-up. No follow-up recommended. The above is in keeping with the ACR TI-RADS recommendations - J Am Coll Radiol  2017;14:587-595. Electronically Signed   By: Malachy Moan M.D.   On: 06/20/2023 07:38   DG Chest 2 View  Result Date: 06/19/2023 CLINICAL DATA:  Dyspnea. EXAM: CHEST - 2 VIEW COMPARISON:  June 06, 2012 FINDINGS: Cardiomediastinal silhouette is normal. Mediastinal contours appear intact. There is no evidence of focal airspace consolidation, pleural effusion or pneumothorax. Increased interstitial markings. Osseous structures are without acute abnormality. Soft tissues are grossly normal. IMPRESSION: Increased interstitial markings may be seen with mild pulmonary edema or hypoventilatory changes. Electronically Signed   By: Ted Mcalpine M.D.   On: 06/19/2023 14:15    Microbiology: Results for orders placed or performed in visit on 01/06/23  COVID-19, Flu A+B and RSV     Status: None   Collection Time: 01/06/23 10:27 AM   Specimen: Nasopharyngeal(NP) swabs in vial transport medium   Nasopharynge  Previous  Result Value Ref Range Status   SARS-CoV-2, NAA Not Detected Not Detected Final   Influenza A, NAA Not Detected Not Detected Final   Influenza B, NAA Not Detected Not Detected Final   RSV, NAA Not Detected Not Detected Final   Test Information: Comment  Final    Comment: This nucleic acid amplification test was developed and its performance characteristics determined by [employer]. Nucleic acid amplification tests include RT-PCR and TMA. This test has not been FDA cleared or approved. This  test has been authorized by FDA under an Emergency Use Authorization (EUA). This test is only authorized for the duration of time the declaration that circumstances exist justifying the authorization of the emergency use of in vitro diagnostic tests for detection of SARS-CoV-2 virus and/or diagnosis of COVID-19 infection under section 564(b)(1) of the Act, 21 U.S.C. 161WRU-0(A) (1), unless the authorization is terminated or revoked sooner. When diagnostic testing is negative, the  possibility of a false negative result should be considered in the context of a patient's recent exposures and the presence of clinical signs and symptoms consistent with COVID-19. An individual without symptoms of COVID-19 and who is not shedding SARS-CoV-2 virus wo uld expect to have a negative (not detected) result in this assay.     Labs: CBC: Recent Labs  Lab 06/18/23 2137 06/19/23 0358 06/20/23 0848  WBC 12.4* 7.5 7.5  HGB 11.8* 10.4* 10.5*  HCT 35.5* 32.3* 32.9*  MCV 80.7 82.4 83.7  PLT 218 193 154   Basic Metabolic Panel: Recent Labs  Lab 06/18/23 2137 06/19/23 0358 06/20/23 0848  NA 134* 138 137  K 3.1* 3.7 3.6  CL 107 112* 113*  CO2 19* 19* 18*  GLUCOSE 201* 134* 144*  BUN 20 15 13   CREATININE 0.59 0.44 0.35*  CALCIUM 8.9 9.0 9.0  MG 1.9  --   --    Liver Function Tests: Recent Labs  Lab 06/19/23 1502  AST 22  ALT 37  ALKPHOS 81  BILITOT 0.7  PROT 6.5  ALBUMIN 3.2*   CBG: Recent Labs  Lab 06/19/23 1204 06/19/23 1634 06/19/23 2023 06/20/23 0800 06/20/23 1145  GLUCAP 153* 130* 147* 106* 150*    Discharge time spent: greater than 30 minutes.  Signed: Alba Cory, MD [facility] 06/20/2023

## 2023-06-20 NOTE — Plan of Care (Signed)
  Problem: Health Behavior/Discharge Planning: Goal: Ability to manage health-related needs will improve Outcome: Progressing   Problem: Skin Integrity: Goal: Risk for impaired skin integrity will decrease Outcome: Progressing   Problem: Education: Goal: Knowledge of General Education information will improve Description: Including pain rating scale, medication(s)/side effects and non-pharmacologic comfort measures Outcome: Progressing   Problem: Clinical Measurements: Goal: Will remain free from infection Outcome: Progressing Goal: Cardiovascular complication will be avoided Outcome: Progressing   Problem: Activity: Goal: Risk for activity intolerance will decrease Outcome: Progressing   Problem: Nutrition: Goal: Adequate nutrition will be maintained Outcome: Progressing

## 2023-06-21 LAB — T3, FREE: T3, Free: 23.8 pg/mL — ABNORMAL HIGH (ref 2.0–4.4)

## 2023-06-22 ENCOUNTER — Telehealth (INDEPENDENT_AMBULATORY_CARE_PROVIDER_SITE_OTHER): Payer: Self-pay

## 2023-06-22 ENCOUNTER — Other Ambulatory Visit: Payer: Self-pay

## 2023-06-22 LAB — THYROGLOBULIN ANTIBODY: Thyroglobulin Antibody: 14.8 IU/mL — ABNORMAL HIGH (ref 0.0–0.9)

## 2023-06-22 NOTE — Transitions of Care (Post Inpatient/ED Visit) (Unsigned)
   06/22/2023  Name: Susan [name] MRN: [id number] DOB: 1976/01/12  Today's TOC FU Call Status: Today's TOC FU Call Status:: Unsuccessul Call (1st Attempt) Unsuccessful Call (1st Attempt) Date: 06/22/23  Attempted to reach the patient regarding the most recent Inpatient/ED visit.  Follow Up Plan: Additional outreach attempts will be made to reach the patient to complete the Transitions of Care (Post Inpatient/ED visit) call.   Signature   Woodfin Ganja LPN [facility] Nurse Health Advisor Direct Dial [phone]

## 2023-06-23 NOTE — Transitions of Care (Post Inpatient/ED Visit) (Signed)
   06/23/2023  Name: Susan [name] MRN: [id number] DOB: [birth year]  Today's TOC FU Call Status: Today's TOC FU Call Status:: Successful TOC FU Call Competed Unsuccessful Call (1st Attempt) Date: 06/22/23 [facility] FU Call Complete Date: 06/23/23  Transition Care Management Follow-up Telephone Call Date of Discharge: 06/18/23 Discharge Facility: [facility]) Type of Discharge: Inpatient Admission Primary Inpatient Discharge Diagnosis:: Atrial fibrillation with rapid ventricular response How have you been since you were released from the hospital?: Better Any questions or concerns?: No  Items Reviewed: Did you receive and understand the discharge instructions provided?: Yes Medications obtained,verified, and reconciled?: Yes (Medications Reviewed) Any new allergies since your discharge?: No Dietary orders reviewed?: Yes Do you have support at home?: Yes  Medications Reviewed Today: Medications Reviewed Today     Reviewed by Merleen Nicely, LPN (Licensed Practical Nurse) on 06/23/23 at 1210  Med List Status: <None>   Medication Order Taking? Sig Documenting Provider Last Dose Status Informant  apixaban (ELIQUIS) 5 MG TABS tablet [id number] Yes Take 1 tablet (5 mg total) by mouth 2 (two) times daily. Regalado, Belkys A, MD Taking Active   atorvastatin (LIPITOR) 20 MG tablet [id number] Yes Take 1 tablet (20 mg total) by mouth daily. Grayce Sessions, NP Taking Active Multiple Informants  metFORMIN (GLUCOPHAGE-XR) 500 MG 24 hr tablet [id number] Yes Take 1 tablet (500 mg total) by mouth 2 (two) times daily. Grayce Sessions, NP Taking Active Multiple Informants  methimazole (TAPAZOLE) 5 MG tablet [id number] Yes Take 1 tablet (5 mg total) by mouth 3 (three) times daily. Regalado, Belkys A, MD Taking Active   metoprolol tartrate (LOPRESSOR) 25 MG tablet [id number] Yes Take 0.5 tablets (12.5 mg total) by mouth 2 (two) times daily. Alba Cory, MD Taking Active              Home Care and Equipment/Supplies: Were Home Health Services Ordered?: No Any new equipment or medical supplies ordered?: No  Functional Questionnaire: Do you need assistance with bathing/showering or dressing?: No Do you need assistance with meal preparation?: No Do you need assistance with eating?: No Do you have difficulty maintaining continence: No Do you need assistance with getting out of bed/getting out of a chair/moving?: No Do you have difficulty managing or taking your medications?: No  Follow up appointments reviewed: PCP Follow-up appointment confirmed?: Yes Date of PCP follow-up appointment?: 06/24/23 Follow-up Provider: Gwinda Passe NP [facility] Follow-up appointment confirmed?: No Do you need transportation to your follow-up appointment?: No Do you understand care options if your condition(s) worsen?: Yes-patient verbalized understanding    SIGNATURE  Woodfin Ganja LPN [facility] Nurse Health Advisor Direct Dial [phone]

## 2023-06-24 ENCOUNTER — Encounter (INDEPENDENT_AMBULATORY_CARE_PROVIDER_SITE_OTHER): Payer: Self-pay | Admitting: Primary Care

## 2023-06-24 ENCOUNTER — Ambulatory Visit (INDEPENDENT_AMBULATORY_CARE_PROVIDER_SITE_OTHER): Payer: Self-pay | Admitting: Primary Care

## 2023-06-24 VITALS — BP 122/79 | HR 70 | Resp 16 | Wt 137.6 lb

## 2023-06-24 DIAGNOSIS — Z09 Encounter for follow-up examination after completed treatment for conditions other than malignant neoplasm: Secondary | ICD-10-CM

## 2023-06-24 DIAGNOSIS — I4891 Unspecified atrial fibrillation: Secondary | ICD-10-CM

## 2023-06-24 DIAGNOSIS — E0590 Thyrotoxicosis, unspecified without thyrotoxic crisis or storm: Secondary | ICD-10-CM

## 2023-06-24 DIAGNOSIS — I10 Essential (primary) hypertension: Secondary | ICD-10-CM

## 2023-06-24 NOTE — Progress Notes (Signed)
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 She presented to the ED with palpitation and chest pain. 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 Patient has No headache, No chest pain, No abdominal pain - No Nausea, No new weakness tingling or numbness, No Cough - shortness of breath  Denies polyuria, polydipsia, polyphasia or vision changes.  Does not check blood sugars at home. .She feels a lot better especially with heart not racing.Medication expensive when these run out she will not be able to afford them. Past Medical History:  Diagnosis Date   Diabetes 1.5, managed as type 2 (HCC)    Hypertension      No Known Allergies    Current Outpatient Medications on File Prior to Visit  Medication Sig Dispense Refill   apixaban (ELIQUIS) 5 MG TABS tablet Take 1 tablet (5 mg total) by mouth 2 (two) times daily. 60 tablet 1   atorvastatin (LIPITOR) 20 MG tablet Take 1 tablet (20 mg total) by mouth daily. 90 tablet 1   metFORMIN (GLUCOPHAGE-XR) 500 MG 24 hr tablet Take 1 tablet (500 mg total) by mouth 2 (two) times daily. 180 tablet 1   methimazole (TAPAZOLE) 5 MG tablet Take 1 tablet (5 mg total) by mouth 3 (three) times daily. 90 tablet 0   metoprolol tartrate (LOPRESSOR) 25 MG tablet Take 0.5 tablets (12.5 mg total) by mouth 2 (two) times daily. 60 tablet 0   No current facility-administered medications on file prior to visit.     Review of System: Comprehensive ROS Pertinent positive and negative noted in HPI    Objective:  Blood Pressure 122/79   Pulse 70   Respiration 16   Weight 137 lb 9.6 oz (62.4 kg)   Oxygen Saturation 98%   Body Mass Index 29.78 kg/m   Filed  Weights   06/24/23 1039  Weight: 137 lb 9.6 oz (62.4 kg)    Physical Exam: General Appearance: Well nourished, in no apparent distress. Eyes: PERRLA, EOMs, conjunctiva no swelling or erythema Sinuses: No Frontal/maxillary tenderness ENT/Mouth: Ext aud canals clear, TMs without erythema, bulging. No erythema, swelling, or exudate on post pharynx.  Tonsils not swollen or erythematous. Hearing normal.  Neck: Supple, thyroid normal.  Respiratory: Respiratory effort normal, BS equal bilaterally without rales, rhonchi, wheezing or stridor.  Cardio: RRR with no MRGs. Brisk peripheral pulses without edema.  Abdomen: Soft, + BS.  Non tender, no guarding, rebound, hernias, masses. Lymphatics: Non tender without lymphadenopathy.  Musculoskeletal: Full ROM, 5/5 strength, normal gait.  Skin: Warm, dry without rashes, lesions, ecchymosis.  Neuro: Cranial nerves intact. Normal muscle tone, no cerebellar symptoms. Sensation intact.  Psych: Awake and oriented X 3, normal affect, Insight and Judgment appropriate.    Assessment:  [name] was seen today for hospitalization follow-up.  Diagnoses and all orders for this visit:  Hyperthyroidism -     Ambulatory referral to Endocrinology  Hospital discharge follow-up F/u with PCP refer to cardiology and endocrinology   Essential hypertension BP well controlled  Explained that having normal blood pressure is the goal and medications are helping to get to goal and maintain normal blood pressure. DIET: Limit salt intake, read nutrition labels to check salt content, limit fried and high fatty foods  Avoid using multisymptom OTC cold preparations that generally contain sudafed which can rise BP. Consult with pharmacist on best cold relief products to use for persons with HTN EXERCISE Discussed incorporating exercise such as walking - 30 minutes most days of the week and can do in 10 minute intervals     Atrial fibrillation with rapid ventricular response  (HCC) -     Ambulatory referral to Cardiology    No orders of the defined types were placed in this encounter.   This note has been created with Education officer, environmental. Any transcriptional errors are unintentional.   Grayce Sessions, NP 06/24/2023, 11:40 AM

## 2023-06-25 ENCOUNTER — Ambulatory Visit (INDEPENDENT_AMBULATORY_CARE_PROVIDER_SITE_OTHER): Payer: Self-pay | Admitting: *Deleted

## 2023-06-25 NOTE — Telephone Encounter (Signed)
Summary: swelling in the feet/burning in the feet at night   Patient called and stated she had an appointment yesterday with PCP yesterday, 7.18.24 and she stated she is now having swollen feet & in the evening, they are burning along with the swelling. Patient stated this has been going on since the weekend and patient did not discuss this issue with PCP.   Patients callback #:  [phone]    Interpreter: [phone] Reason for Disposition  [1] MODERATE pain (e.g., interferes with normal activities, limping) AND [2] present > 3 days  Answer Assessment - Initial Assessment Questions 1. ONSET: "When did the pain start?"      Swelling and burning pain started Sunday 2. LOCATION: "Where is the pain located?"      Both feet- left is worse 3. PAIN: "How bad is the pain?"    (Scale 1-10; or mild, moderate, severe)  - MILD (1-3): doesn't interfere with normal activities.   - MODERATE (4-7): interferes with normal activities (e.g., work or school) or awakens from sleep, limping.   - SEVERE (8-10): excruciating pain, unable to do any normal activities, unable to walk.      moderate 4. WORK OR EXERCISE: "Has there been any recent work or exercise that involved this part of the body?"      Same routine 5. CAUSE: "What do you think is causing the foot pain?"     Patient feels from hospital stay 6. OTHER SYMPTOMS: "Do you have any other symptoms?" (e.g., leg pain, rash, fever, numbness)     no  Protocols used: Foot Pain-A-AH

## 2023-06-25 NOTE — Telephone Encounter (Signed)
Scheduled an appt for patient to address concerns.

## 2023-06-25 NOTE — Telephone Encounter (Signed)
  Chief Complaint: bilateral feet swelling/burning pain Symptoms: burning pain in feet, swelling up to ankles Frequency: started Sunday Pertinent Negatives: Patient denies swelling in legs Disposition: [] ED /[] Urgent Care (no appt availability in office) / [] Appointment(In office/virtual)/ []  [facility]/ [] Home Care/ [] Refused Recommended Disposition /[] [facility]/ [x]  Follow-up with PCP Additional Notes: Patient was just in office yesterday- but forgot to get provider to check feet- she reports burning pain and swelling in both feet- she reports it does not go away at night. Patient advised no open appointment- will send message to provider- UC if she should get worse or not hear back from provider.

## 2023-07-04 ENCOUNTER — Ambulatory Visit: Payer: No Typology Code available for payment source | Admitting: Family

## 2023-07-06 ENCOUNTER — Ambulatory Visit: Payer: No Typology Code available for payment source | Admitting: Family

## 2023-07-14 ENCOUNTER — Inpatient Hospital Stay (HOSPITAL_COMMUNITY)
Admission: RE | Admit: 2023-07-14 | Payer: No Typology Code available for payment source | Source: Ambulatory Visit | Admitting: Physician Assistant

## 2023-07-14 NOTE — Progress Notes (Incomplete)
Primary Care Physician: Grayce Sessions, NP Primary Cardiologist: None Electrophysiologist: None  Referring Physician: Dr Thomasena Edis Susan [name] is a 47 y.o. female with a history of DM, hyperthyroidism, HTN, atrial fibrillation who presents for consultation in the [facility]. The patient was initially diagnosed with atrial fibrillation 06/18/23 after presenting to the ED with symptoms of tachypalpitations. ECG showed afib with RVR and she was started on diltiazem which converted her to SR. Patient was also started on Eliquis for a CHADS2VASC score of 3. TSH on admission was <0.01, T3 and T4 at 23.8 and 4.47 respectively. She was started on methimazole. Echo showed normal EF, mild MR, mild AS.   Today, ***  Today, she denies symptoms of ***palpitations, chest pain, shortness of breath, orthopnea, PND, lower extremity edema, dizziness, presyncope, syncope, snoring, daytime somnolence, bleeding, or neurologic sequela. The patient is tolerating medications without difficulties and is otherwise without complaint today.    Atrial Fibrillation Risk Factors:  she {Action; does/does not:19097} have symptoms or diagnosis of sleep apnea. she {ACTION; IS/IS [id number]} compliant with CPAP therapy. she {Action; does/does not:19097} have a history of rheumatic fever. she {Action; does/does not:19097} have a history of alcohol use. The patient {Action; does/does not:19097} have a history of early familial atrial fibrillation or other arrhythmias.  Atrial Fibrillation Management history:  Previous antiarrhythmic drugs: none Previous cardioversions: none Previous ablations: none Anticoagulation history: Eliquis  ROS- All systems are reviewed and negative except as per the HPI above.   Physical Exam: There were no vitals taken for this visit.  GEN: Well nourished, well developed in no acute distress NECK: No JVD; No carotid bruits CARDIAC:  {EPRHYTHM:28826}, no murmurs, rubs, gallops RESPIRATORY:  Clear to auscultation without rales, wheezing or rhonchi  ABDOMEN: Soft, non-tender, non-distended EXTREMITIES:  No edema; No deformity   Wt Readings from Last 3 Encounters:  06/24/23 62.4 kg  06/18/23 59.9 kg  06/12/23 60.5 kg     EKG today demonstrates  ***  Echo 06/20/23 demonstrated   1. Left ventricular ejection fraction, by estimation, is 60 to 65%. The  left ventricle has normal function. The left ventricle has no regional  wall motion abnormalities. Left ventricular diastolic parameters were  normal.   2. Right ventricular systolic function is normal. The right ventricular  size is normal. There is moderately elevated pulmonary artery systolic  pressure.   3. The mitral valve is normal in structure. Mild mitral valve  regurgitation. No evidence of mitral stenosis.   4. The aortic valve is tricuspid. Aortic valve regurgitation is mild.  Mild aortic valve stenosis. Aortic valve mean gradient measures 10.0 mmHg.  Aortic valve Vmax measures 2.30 m/s.   5. The inferior vena cava is dilated in size with <50% respiratory  variability, suggesting right atrial pressure of 15 mmHg.   Comparison(s): No prior Echocardiogram.    CHA2DS2-VASc Score =    The patient's score is based upon:   {Click here to calculate score.  REFRESH note before signing. :1}     ASSESSMENT AND PLAN: {Select the correct AFib Diagnosis                 :[id number]}  *** Suspect 2/2 hyperthyroidism Continue Eliquis 5 mg BID for now. Once her thyroid is treated, could consider a cardiac monitor.  Continue Lopressor 12.5 mg BID  HTN Stable on current regimen ***  Hyperthyroidism Referred to endocrinology On methimazole ***   Follow up ***  [name] PA-C [facility] [address] [place], [place] [address] [phone]

## 2023-07-15 ENCOUNTER — Ambulatory Visit (HOSPITAL_COMMUNITY)
Admission: RE | Admit: 2023-07-15 | Discharge: 2023-07-15 | Disposition: A | Discharge status: Home / Self Care | Payer: MEDICAID | Source: Ambulatory Visit | Attending: Physician Assistant | Admitting: Physician Assistant

## 2023-07-15 ENCOUNTER — Other Ambulatory Visit (HOSPITAL_COMMUNITY): Payer: Self-pay

## 2023-07-15 ENCOUNTER — Encounter (HOSPITAL_COMMUNITY): Payer: Self-pay | Admitting: Internal Medicine

## 2023-07-15 VITALS — BP 122/78 | HR 66 | Ht <= 58 in | Wt 143.2 lb

## 2023-07-15 DIAGNOSIS — E0590 Thyrotoxicosis, unspecified without thyrotoxic crisis or storm: Secondary | ICD-10-CM | POA: Insufficient documentation

## 2023-07-15 DIAGNOSIS — I10 Essential (primary) hypertension: Secondary | ICD-10-CM | POA: Insufficient documentation

## 2023-07-15 DIAGNOSIS — D6869 Other thrombophilia: Secondary | ICD-10-CM

## 2023-07-15 DIAGNOSIS — E118 Type 2 diabetes mellitus with unspecified complications: Secondary | ICD-10-CM | POA: Insufficient documentation

## 2023-07-15 DIAGNOSIS — I4891 Unspecified atrial fibrillation: Secondary | ICD-10-CM

## 2023-07-15 DIAGNOSIS — Z7901 Long term (current) use of anticoagulants: Secondary | ICD-10-CM | POA: Insufficient documentation

## 2023-07-15 DIAGNOSIS — I480 Paroxysmal atrial fibrillation: Secondary | ICD-10-CM

## 2023-07-15 MED ORDER — METOPROLOL TARTRATE 25 MG PO TABS
12.5000 mg | ORAL_TABLET | Freq: Two times a day (BID) | ORAL | 3 refills | Status: DC
  Filled 2023-07-15: qty 60, 60d supply, fill #0
  Filled 2023-07-16: qty 30, 30d supply, fill #0
  Filled 2023-08-21: qty 30, 30d supply, fill #1
  Filled 2023-09-17 (×2): qty 30, 30d supply, fill #2

## 2023-07-15 MED ORDER — APIXABAN 5 MG PO TABS
5.0000 mg | ORAL_TABLET | Freq: Two times a day (BID) | ORAL | 3 refills | Status: DC
Start: 2023-07-15 — End: 2023-12-24
  Filled 2023-07-15 – 2023-07-16 (×2): qty 60, 30d supply, fill #0
  Filled 2023-10-02: qty 60, 30d supply, fill #1

## 2023-07-15 NOTE — Progress Notes (Incomplete)
Primary Care Physician: Grayce Sessions, NP Primary Cardiologist: None Electrophysiologist: None  Referring Physician: Dr Thomasena Edis [name] is a 47 y.o. female with a history of DM, hyperthyroidism, HTN, atrial fibrillation who presents for consultation in the [facility]. The patient was initially diagnosed with atrial fibrillation 06/18/23 after presenting to the ED with symptoms of tachypalpitations. ECG showed afib with RVR and she was started on diltiazem which converted her to SR. Patient was also started on Eliquis for a CHADS2VASC score of 3. TSH on admission was <0.01, T3 and T4 at 23.8 and 4.47 respectively. She was started on methimazole. Echo showed normal EF, mild MR, mild AS.   Today, ***  Today, she denies symptoms of ***palpitations, chest pain, shortness of breath, orthopnea, PND, lower extremity edema, dizziness, presyncope, syncope, snoring, daytime somnolence, bleeding, or neurologic sequela. The patient is tolerating medications without difficulties and is otherwise without complaint today.    Atrial Fibrillation Risk Factors:  she {Action; does/does not:19097} have symptoms or diagnosis of sleep apnea. she {ACTION; IS/IS ZOX:[id number]} compliant with CPAP therapy. she {Action; does/does not:19097} have a history of rheumatic fever. she {Action; does/does not:19097} have a history of alcohol use. The patient {Action; does/does not:19097} have a history of early familial atrial fibrillation or other arrhythmias.  Atrial Fibrillation Management history:  Previous antiarrhythmic drugs: none Previous cardioversions: none Previous ablations: none Anticoagulation history: Eliquis  ROS- All systems are reviewed and negative except as per the HPI above.   Physical Exam: There were no vitals taken for this visit.  GEN: Well nourished, well developed in no acute distress NECK: No JVD; No carotid bruits CARDIAC:  {EPRHYTHM:28826}, no murmurs, rubs, gallops RESPIRATORY:  Clear to auscultation without rales, wheezing or rhonchi  ABDOMEN: Soft, non-tender, non-distended EXTREMITIES:  No edema; No deformity   Wt Readings from Last 3 Encounters:  06/24/23 62.4 kg  06/18/23 59.9 kg  06/12/23 60.5 kg     EKG today demonstrates  ***  Echo 06/20/23 demonstrated   1. Left ventricular ejection fraction, by estimation, is 60 to 65%. The  left ventricle has normal function. The left ventricle has no regional  wall motion abnormalities. Left ventricular diastolic parameters were  normal.   2. Right ventricular systolic function is normal. The right ventricular  size is normal. There is moderately elevated pulmonary artery systolic  pressure.   3. The mitral valve is normal in structure. Mild mitral valve  regurgitation. No evidence of mitral stenosis.   4. The aortic valve is tricuspid. Aortic valve regurgitation is mild.  Mild aortic valve stenosis. Aortic valve mean gradient measures 10.0 mmHg.  Aortic valve Vmax measures 2.30 m/s.   5. The inferior vena cava is dilated in size with <50% respiratory  variability, suggesting right atrial pressure of 15 mmHg.   Comparison(s): No prior Echocardiogram.    CHA2DS2-VASc Score =    The patient's score is based upon:   {Click here to calculate score.  REFRESH note before signing. :1}     ASSESSMENT AND PLAN: {Select the correct AFib Diagnosis                 :[id number]}  *** Suspect 2/2 hyperthyroidism Continue Eliquis 5 mg BID for now. Once her thyroid is treated, could consider a cardiac monitor.  Continue Lopressor 12.5 mg BID  HTN Stable on current regimen ***  Hyperthyroidism Referred to endocrinology On methimazole ***   Follow up ***  [name] PA-C [facility] [address] [place], [place] [address] [phone]

## 2023-07-15 NOTE — Progress Notes (Addendum)
Primary Care Physician: Grayce Sessions, NP Primary Cardiologist: None Electrophysiologist: None  Referring Physician: Dr Susan [name] [name] is a 47 y.o. female with a history of DM, hyperthyroidism, HTN, atrial fibrillation who presents for consultation in the [facility]. The patient was initially diagnosed with atrial fibrillation 06/18/23 after presenting to the ED with symptoms of tachypalpitations. ECG showed afib with RVR and she was started on diltiazem which converted her to SR. Patient was also started on Eliquis for a CHADS2VASC score of 3. TSH on admission was <0.01, T3 and T4 at 23.8 and 4.47 respectively. She was started on methimazole. Echo showed normal EF, mild MR, mild AS.   On evaluation today, she is currently in NSR. She has remained compliant with methimazole for thyroid treatment. She has not had any episodes of Afib since hospital visit. She has been taking accidentally 1 whole tablet of metoprolol twice daily instead of half tablet twice daily. No missed Eliquis. She has an Apple watch.   Today, she denies symptoms of palpitations, chest pain, shortness of breath, orthopnea, PND, lower extremity edema, dizziness, presyncope, syncope, snoring, daytime somnolence, bleeding, or neurologic sequela. The patient is tolerating medications without difficulties and is otherwise without complaint today.   Atrial Fibrillation Management history:  Previous antiarrhythmic drugs: none Previous cardioversions: none Previous ablations: none Anticoagulation history: Eliquis  ROS- All systems are reviewed and negative except as per the HPI above.   Physical Exam: BP 122/78   Pulse 66   Ht 4\' 9"  (1.448 m)   Wt 65 kg   BMI 30.99 kg/m   GEN: Well nourished, well developed in no acute distress NECK: No JVD; No carotid bruits CARDIAC: Regular rate and rhythm, no murmurs, rubs, gallops RESPIRATORY:  Clear to auscultation  without rales, wheezing or rhonchi  ABDOMEN: Soft, non-tender, non-distended EXTREMITIES:  No edema; No deformity   Wt Readings from Last 3 Encounters:  07/15/23 65 kg  06/24/23 62.4 kg  06/18/23 59.9 kg     EKG today demonstrates  Vent. rate 66 BPM PR interval 146 ms QRS duration 70 ms QT/QTcB 370/387 ms P-R-T axes 47 86 61 Normal sinus rhythm Normal ECG When compared with ECG of 19-Jun-2023 03:24, PREVIOUS ECG IS PRESENT  Echo 06/20/23 demonstrated   1. Left ventricular ejection fraction, by estimation, is 60 to 65%. The  left ventricle has normal function. The left ventricle has no regional  wall motion abnormalities. Left ventricular diastolic parameters were  normal.   2. Right ventricular systolic function is normal. The right ventricular  size is normal. There is moderately elevated pulmonary artery systolic  pressure.   3. The mitral valve is normal in structure. Mild mitral valve  regurgitation. No evidence of mitral stenosis.   4. The aortic valve is tricuspid. Aortic valve regurgitation is mild.  Mild aortic valve stenosis. Aortic valve mean gradient measures 10.0 mmHg.  Aortic valve Vmax measures 2.30 m/s.   5. The inferior vena cava is dilated in size with <50% respiratory  variability, suggesting right atrial pressure of 15 mmHg.   Comparison(s): No prior Echocardiogram.    CHA2DS2-VASc Score = 3  The patient's score is based upon: CHF History: 0 HTN History: 1 Diabetes History: 1 Stroke History: 0 Vascular Disease History: 0 Age Score: 0 Gender Score: 1       ASSESSMENT AND PLAN: Paroxysmal Atrial Fibrillation (ICD10:  I48.0) The patient's CHA2DS2-VASc score is 3, indicating a 3.2%  annual risk of stroke.    She is currently in NSR.   Suspect secondary to hyperthyroidism Continue Eliquis 5 mg BID for now. Once her thyroid is treated, could consider a cardiac monitor.  Continue Lopressor 12.5 mg BID  Secondary Hypercoagulable State (ICD10:   D68.69) The patient is at significant risk for stroke/thromboembolism based upon her CHA2DS2-VASc Score of 3.  Continue Apixaban (Eliquis).  No missed doses.    HTN Stable on current regimen   Hyperthyroidism Referred to endocrinology On methimazole    Follow up as scheduled with Dr. Cristal Deer. Follow up 5 months Afib clinic.    Lake Bells, PA-C Afib Clinic [facility] [address] [place], [place] [address] [phone]

## 2023-07-16 ENCOUNTER — Other Ambulatory Visit: Payer: Self-pay

## 2023-07-16 ENCOUNTER — Other Ambulatory Visit (INDEPENDENT_AMBULATORY_CARE_PROVIDER_SITE_OTHER): Payer: Self-pay | Admitting: Primary Care

## 2023-07-16 ENCOUNTER — Other Ambulatory Visit (HOSPITAL_COMMUNITY): Payer: Self-pay

## 2023-07-16 DIAGNOSIS — I10 Essential (primary) hypertension: Secondary | ICD-10-CM

## 2023-07-16 DIAGNOSIS — Z760 Encounter for issue of repeat prescription: Secondary | ICD-10-CM

## 2023-07-16 NOTE — Telephone Encounter (Addendum)
Medication Refill - Medication: Rx #: [id number]  metoprolol tartrate (LOPRESSOR) 25 MG tablet [[id number]]   methimazole (TAPAZOLE) 5 MG tablet [[id number]]     Has the patient contacted their pharmacy? Yes.   (Agent: If no, request that the patient contact the pharmacy for the refill. If patient does not wish to contact the pharmacy document the reason why and proceed with request.) (Agent: If yes, when and what did the pharmacy advise?)  Preferred Pharmacy (with phone number or street name): [facility] Phone: [phone]  Fax: [phone]   Has the patient been seen for an appointment in the last year OR does the patient have an upcoming appointment? Yes.    Agent: Please be advised that RX refills may take up to 3 business days. We ask that you follow-up with your pharmacy.

## 2023-07-16 NOTE — Telephone Encounter (Deleted)
methimazole (TAPAZOLE) 5 MG tablet [[id number]]

## 2023-07-17 ENCOUNTER — Telehealth: Payer: Self-pay

## 2023-07-17 ENCOUNTER — Other Ambulatory Visit: Payer: Self-pay

## 2023-07-17 NOTE — Telephone Encounter (Signed)
Requested medications are due for refill today.  Tapazole - yes, Metoprolol - unsure  Requested medications are on the active medications list.  yes  Last refill. varied  Future visit scheduled.   yes  Notes to clinic.  Tapazole is  delegated. Both rx's are signed by other providers.    Requested Prescriptions  Pending Prescriptions Disp Refills   methimazole (TAPAZOLE) 5 MG tablet 90 tablet 0    Sig: Take 1 tablet (5 mg total) by mouth 3 (three) times daily.     Not Delegated - Endocrinology:  Hyperthyroid Agents Failed - 07/17/2023  7:58 AM      Failed - This refill cannot be delegated      Failed - TSH in normal range and within 180 days    TSH  Date Value Ref Range Status  06/19/2023 <0.010 (L) 0.350 - 4.500 uIU/mL Final    Comment:    Performed by a 3rd Generation assay with a functional sensitivity of <=0.01 uIU/mL. Performed at [facility], [address] [place], [place] [address]          Failed - T3 Total in normal range and within 180 days    T3, Free  Date Value Ref Range Status  06/19/2023 23.8 (H) 2.0 - 4.4 pg/mL Final    Comment:    (NOTE) Performed At: [facility] [address] [place], [place] [id number] Jolene Schimke MD [id number]          Failed - T4 free in normal range and within 180 days    Free T4  Date Value Ref Range Status  06/19/2023 4.47 (H) 0.61 - 1.12 ng/dL Final    Comment:    (NOTE) Biotin ingestion may interfere with free T4 tests. If the results are inconsistent with the TSH level, previous test results, or the clinical presentation, then consider biotin interference. If needed, order repeat testing after stopping biotin. Performed at [facility], [address] [place], [place] [address]          Passed - Valid encounter within last 6 months    Recent Outpatient Visits           3 weeks ago Hyperthyroidism   [place] [facility] Grayce Sessions, NP   1  month ago Type 2 diabetes mellitus without complication, without long-term current use of insulin (HCC)   [place] [facility] Grayce Sessions, NP   6 months ago Type 2 diabetes mellitus without complication, without long-term current use of insulin (HCC)   [place] [facility] Grayce Sessions, NP   10 months ago Type 2 diabetes mellitus without complication, without long-term current use of insulin (HCC)   [place] [facility] Grayce Sessions, NP   1 year ago Type 2 diabetes mellitus without complication, without long-term current use of insulin [facility])   [place] [facility] Grayce Sessions, NP       Future Appointments             In 4 days Grayce Sessions, NP [place] [facility]   In 2 months Jodelle Red, MD [facility] at [facility], [place]   In 2 months Randa Evens, Kinnie Scales, NP [place] [facility]   In 5 months Randa Evens, Kinnie Scales, NP [place] [facility]             metoprolol tartrate (  LOPRESSOR) 25 MG tablet 60 tablet 3    Sig: Take 0.5 tablets (12.5 mg total) by mouth 2 (two) times daily.     Cardiovascular:  Beta Blockers Passed - 07/17/2023  7:58 AM      Passed - Last BP in normal range    BP Readings from Last 1 Encounters:  07/15/23 122/78         Passed - Last Heart Rate in normal range    Pulse Readings from Last 1 Encounters:  07/15/23 66         Passed - Valid encounter within last 6 months    Recent Outpatient Visits           3 weeks ago Hyperthyroidism   [place] [facility] Grayce Sessions, NP   1 month ago Type 2 diabetes mellitus without complication, without long-term current use of insulin (HCC)   [place] [facility] Grayce Sessions, NP   6 months ago Type 2 diabetes mellitus without complication, without long-term  current use of insulin (HCC)   [place] [facility] Grayce Sessions, NP   10 months ago Type 2 diabetes mellitus without complication, without long-term current use of insulin (HCC)   [place] [facility] Grayce Sessions, NP   1 year ago Type 2 diabetes mellitus without complication, without long-term current use of insulin (HCC)   [place] [facility] Grayce Sessions, NP       Future Appointments             In 4 days Grayce Sessions, NP  [facility]   In 2 months Jodelle Red, MD [facility] at [facility], [place]   In 2 months Randa Evens, Kinnie Scales, NP [facility]   In 5 months Randa Evens, Kinnie Scales, NP [facility]

## 2023-07-17 NOTE — Telephone Encounter (Signed)
A [employer] patient assistance application was faxed today to program for processing. Eliquis will ship to home if approved.

## 2023-07-20 ENCOUNTER — Other Ambulatory Visit (INDEPENDENT_AMBULATORY_CARE_PROVIDER_SITE_OTHER): Payer: Self-pay | Admitting: Primary Care

## 2023-07-20 ENCOUNTER — Other Ambulatory Visit: Payer: Self-pay

## 2023-07-21 ENCOUNTER — Encounter (INDEPENDENT_AMBULATORY_CARE_PROVIDER_SITE_OTHER): Payer: Self-pay | Admitting: Primary Care

## 2023-07-21 ENCOUNTER — Ambulatory Visit (INDEPENDENT_AMBULATORY_CARE_PROVIDER_SITE_OTHER): Payer: Self-pay | Admitting: Primary Care

## 2023-07-21 ENCOUNTER — Other Ambulatory Visit: Payer: Self-pay

## 2023-07-21 VITALS — BP 123/64 | HR 57 | Resp 16 | Wt 137.0 lb

## 2023-07-21 DIAGNOSIS — E0590 Thyrotoxicosis, unspecified without thyrotoxic crisis or storm: Secondary | ICD-10-CM

## 2023-07-21 DIAGNOSIS — I4891 Unspecified atrial fibrillation: Secondary | ICD-10-CM

## 2023-07-21 DIAGNOSIS — E6609 Other obesity due to excess calories: Secondary | ICD-10-CM

## 2023-07-21 DIAGNOSIS — E139 Other specified diabetes mellitus without complications: Secondary | ICD-10-CM

## 2023-07-21 DIAGNOSIS — Z6830 Body mass index (BMI) 30.0-30.9, adult: Secondary | ICD-10-CM

## 2023-07-21 MED ORDER — METHIMAZOLE 5 MG PO TABS
5.0000 mg | ORAL_TABLET | Freq: Three times a day (TID) | ORAL | 0 refills | Status: DC
  Filled 2023-07-21: qty 90, 30d supply, fill #0

## 2023-07-21 NOTE — Patient Instructions (Addendum)
Recuento de caloras para bajar de peso Calorie Counting for [employer] Loss Las caloras son unidades de Engineer, drilling. El cuerpo necesita una cierta cantidad de caloras de los alimentos para que lo ayuden a funcionar durante todo Medical laboratory scientific officer. Cuando se comen o beben ms caloras de las que el cuerpo [place], este acumula las caloras adicionales mayormente como grasa. Cuando se comen o beben menos caloras de las que el cuerpo [place], este quema grasa para obtener la energa que necesita. El recuento de caloras es el registro de la cantidad de caloras que se comen y Audiological scientist. El recuento de caloras puede ser de ayuda si necesita perder peso. Si come menos caloras de las que el cuerpo necesita, debera bajar de [place]. Pregntele al mdico cul es un peso sano para usted. Para que el recuento de caloras funcione, usted tendr que ingerir la cantidad de caloras adecuadas cada da, para bajar una cantidad de peso saludable por semana. Un nutricionista puede ayudar a determinar la cantidad de caloras que usted necesita por da y sugerirle formas de Barista su objetivo calrico. Neomia Dear cantidad de peso saludable para bajar cada semana suele ser entre 1 y 2 libras (0.5 a 0.9 kg). Esto habitualmente significa que su ingesta diaria de caloras se debera reducir en unas 500 a 750 caloras. Ingerir de 1200 a 1500 caloras por Clinical research associate a la [employer] de las mujeres a Publishing copy de [place]. Ingerir de 1500 a 1800 caloras por Clinical research associate a la [employer] de los hombres a Publishing copy de [place]. Qu debo saber acerca del recuento de caloras? Trabaje con el mdico o el nutricionista para determinar cuntas caloras debe recibir Management consultant. A fin de alcanzar su objetivo diario de caloras, tendr que: Averiguar cuntas caloras hay en cada alimento que le Lobbyist. Intente hacerlo antes de comer. Decidir la cantidad que puede comer del alimento. Llevar un registro de los alimentos. Para esto, anote lo que comi y cuntas  caloras tena. Para perder peso con xito, es importante equilibrar el recuento de caloras con un estilo de vida saludable que incluya actividad fsica de forma regular. Dnde encuentro informacin sobre las caloras?  Es posible Veterinary surgeon cantidad de caloras que contiene un alimento en la etiqueta de informacin nutricional. Si un alimento no tiene una etiqueta de informacin nutricional, intente buscar las caloras en Internet o pida ayuda al nutricionista. Recuerde que las caloras se calculan por porcin. Si opta por comer ms de una porcin de un alimento, tendr [employer] las caloras de una porcin por la cantidad de porciones que planea comer. Por ejemplo, la etiqueta de un envase de pan puede decir que el tamao de una porcin es 1 rodaja, y que una porcin tiene 90 caloras. Si come 1 rodaja, habr comido 90 caloras. Si come 2 rodajas, habr comido 180 caloras. Cmo llevo un registro de comidas? Despus de cada vez que coma, anote lo siguiente en el registro de alimentos lo antes posible: Lo que comi. Asegrese de [employer], las salsas y otros extras [employer] Technologies Corporation. La cantidad que comi. Esto se puede medir en tazas, onzas o cantidad de alimentos. Cuntas caloras haba en cada alimento y en cada bebida. La cantidad total de caloras en la comida que tom. Tenga a Scientific laboratory technician de alimentos, por ejemplo, en un anotador de bolsillo o utilice una aplicacin o sitio web en el telfono mvil. Algunos programas calcularn las caloras por usted y Insurance account manager la cantidad de  caloras que le quedan para llegar al objetivo diario. Cules son algunos consejos para controlar las porciones? Sepa cuntas caloras hay en una porcin. Esto lo ayudar a saber cuntas porciones de un alimento determinado puede comer. Use una taza medidora para medir los tamaos de las porciones. Tambin Hydrographic surveyor las porciones en una balanza de cocina. Con el tiempo, podr hacer  un clculo estimativo de los tamaos de las porciones de algunos alimentos. Dedique tiempo a poner porciones de diferentes alimentos en sus platos, tazones y tazas predilectos, a fin de saber cmo se ve una porcin. Intente no comer directamente de un envase de alimentos, por ejemplo, de una bolsa o una caja. Comer directamente del envase dificulta ver cunto est comiendo y puede conducir a Actuary. Ponga la cantidad [employer] gustara comer en una taza o un plato, a fin de asegurarse de que est comiendo la porcin correcta. Use platos, vasos y tazones ms pequeos para medir porciones ms pequeas y Automotive engineer no comer en exceso. Intente no realizar varias tareas al [employer]. Por ejemplo, evite mirar televisin o usar la [employer]ant come. Si es la hora de comer, sintese a Museum/gallery conservator y disfrute de Chemical engineer. Esto lo ayudar a Public house manager cundo est satisfecho. Tambin le permitir estar ms consciente de qu come y cunto come. Consejos para seguir Surveyor, minerals Al leer las etiquetas de los alimentos Controle el recuento de caloras en comparacin con el tamao de la porcin. El tamao de la porcin puede ser ms pequeo de lo que suele comer. Verifique la fuente de las caloras. Intente elegir alimentos ricos en protenas, fibras y vitaminas, y bajos en grasas saturadas, grasas trans y [place]. Al ir de compras Lea las etiquetas nutricionales cuando compre. Esto lo ayudar a tomar decisiones saludables sobre qu alimentos comprar. Preste atencin a las etiquetas nutricionales de alimentos bajos en grasas o sin grasas. Estos alimentos a veces tienen la misma cantidad de caloras o ms caloras que las versiones ricas en grasas. Con frecuencia, tambin tienen agregados de azcar, almidn o sal, para darles el sabor que fue eliminado con las grasas. Haga una lista de compras con los alimentos que tienen un menor contenido de caloras y Leisure centre manager. Al cocinar Intente cocinar sus alimentos preferidos  de una manera ms saludable. Por ejemplo, pruebe hornear en vez de frer. Utilice productos lcteos descremados. Planificacin de las comidas Utilice ms frutas y verduras. La mitad de su plato debe ser de frutas y verduras. Incluya protenas magras, como pollo, pavo y [place]. Estilo de [employer], trate de hacer una de las siguientes cosas: 150 minutos de ejercicio moderado, como caminar. 75 minutos de ejercicio enrgico, como correr. Informacin general Sepa cuntas caloras tienen los alimentos que come con ms frecuencia. Esto le ayudar a contar las caloras ms rpidamente. Encuentre un mtodo para controlar las caloras que funcione para usted. Sea creativo. Pruebe aplicaciones o programas distintos, si llevar un registro de las caloras no funciona para usted. Qu alimentos debo consumir?  Consuma alimentos nutritivos. Es mejor comer un alimento nutritivo, de alto contenido calrico, como un aguacate, que uno con pocos nutrientes, como una bolsa de patatas fritas. Use sus caloras en alimentos y bebidas que lo sacien y no lo dejen con apetito apenas termina de comer. Ejemplos de alimentos que lo sacian son los frutos secos y Civil engineer, contracting de frutos secos, verduras, Associate Professor y Forensic scientist con alto contenido de Research scientist (life sciences) como los cereales integrales. Los alimentos con [employer] Financial  contenido de [place] son aquellos que tienen ms de 5 g de fibra por porcin. Preste atencin a las Limited Brands. Las bebidas de bajas caloras incluyen agua y refrescos sin International aid/development worker. Es posible que los productos que se enumeran ms [place] no constituyan una lista completa de los alimentos y las bebidas que puede tomar. Consulte a un nutricionista para obtener ms informacin. Qu alimentos debo limitar? Limite el consumo de alimentos o bebidas que no sean buenas fuentes de vitaminas, minerales o protenas, o que tengan alto contenido de grasas no saludables. Estos incluyen: Caramelos. Otros  dulces. Refrescos, bebidas con caf especiales, alcohol y [place]. Es posible que los productos que se enumeran ms [place] no constituyan una lista completa de los alimentos y las bebidas que Personnel officer. Consulte a un nutricionista para obtener ms informacin. Cmo puedo hacer el recuento de caloras cuando como afuera? Preste atencin a las porciones. A menudo, las porciones son mucho ms grandes al comer afuera. Pruebe con estos consejos para mantener las porciones ms pequeas: Considere la posibilidad de compartir una comida en lugar de tomarla toda usted solo. Si pide su propia comida, coma solo la mitad. Antes de empezar a comer, pida un recipiente y ponga la mitad de la comida en l. Cuando sea posible, considere la posibilidad de pedir porciones ms pequeas del men en lugar de porciones completas. Preste atencin a Soil scientist de alimentos y bebidas. Saber la forma en que se cocinan los alimentos y lo que incluye la comida puede ayudarlo a ingerir menos caloras. Si se detallan las caloras en el men, elija las opciones que contengan la menor cantidad. Elija platos que incluyan verduras, frutas, cereales integrales, productos lcteos con bajo contenido de grasa y Associate Professor. Opte por los alimentos hervidos, asados, cocidos a la parrilla o al vapor. Evite los alimentos a los que se les ponga mantequilla, que estn empanados o fritos, o que se sirvan con salsa a base de crema. Generalmente, los alimentos que se etiquetan como "crujientes" estn fritos, a menos que se indique lo contrario. Elija el agua, la [place], [place] t helado sin azcar u otras bebidas que no contengan azcares agregados. Si desea una bebida alcohlica, escoja una opcin con menos caloras, como una copa de vino o una cerveza ligera. Ordene los [employer], las salsas y los jarabes aparte. Estos son, con frecuencia, de alto contenido en caloras, por lo que debe limitar la cantidad que ingiere. Si desea [place], elija una de hortalizas y pida carnes a la parrilla. Evite las guarniciones adicionales como el tocino, el queso o los alimentos fritos. Ordene el aderezo aparte o pida aceite de [place] y vinagre o limn para Emergency planning/management officer. Haga un clculo estimativo de la cantidad de porciones que le sirven. Conocer el tamao de las porciones lo ayudar a Theme park manager atento a la cantidad de comida que come [employer]. Dnde buscar ms informacin Centers for Disease Control and Prevention (Centros para el Control y la Prevencin de Enfermedades): [email or web address].br U.S. Department of Agriculture (Departamento de Agricultura de los EE. UU.): [email or web address].dk Resumen El recuento de caloras es el registro de la cantidad de caloras que se comen y Audiological scientist. Si come menos caloras de las que el cuerpo necesita, debera bajar de [place]. Una cantidad de peso saludable para bajar por semana suele ser entre 1 y 2 libras (0.5 a 0.9 kg). Esto significa, con frecuencia, reducir su ingesta diaria de caloras unas 500 a 750 caloras. Es posible  encontrar la cantidad de caloras que contiene un alimento en la etiqueta de informacin nutricional. Si un alimento no tiene una etiqueta de informacin nutricional, intente buscar las caloras en Internet o pida ayuda al nutricionista. Use platos, vasos y tazones ms pequeos para medir porciones ms pequeas y Automotive engineer no comer en exceso. Use sus caloras en alimentos y bebidas que lo sacien y no lo dejen con apetito poco tiempo despus de haber comido. Esta informacin no tiene Theme park manager el consejo del mdico. Asegrese de hacerle al mdico cualquier pregunta que tenga. Document Revised: 03/19/2020 Document Reviewed: 03/19/2020 Elsevier Patient Education  2023 Elsevier Inc. Fibrilacin auricular Atrial Fibrillation La fibrilacin auricular (FA) es un tipo de latido cardaco irregular o rpido. Si tiene FA, el corazn late sin ningn orden. Esto dificulta el bombeo de la sangre  por parte del corazn de [place] normal. La FA puede aparecer y Geneticist, molecular, o puede convertirse en un problema prolongado. Si la FA no se trata, puede aumentar el riesgo de accidente cerebrovascular, insuficiencia cardaca y otros problemas cardacos. Cules son las causas? La FA puede ser causada por enfermedades que daan el sistema elctrico del corazn. Entre ellas, se incluyen las siguientes: Presin arterial alta. Insuficiencia cardaca. Enfermedades de las vlvulas cardacas. Ciruga cardaca. Diabetes. Enfermedad tiroidea. Enfermedad renal. Enfermedades pulmonares, como neumona o EPOC. Apnea del sueo. Algunas veces, la causa no se conoce. Qu incrementa el riesgo? Es ms probable que desarrolle una FA en los siguientes casos: Es una persona de [place]. Hace ejercicio muy extenuante con frecuencia. Tiene antecedentes familiares de FA. Es hombre. Es caucsico. Tiene sobrepeso. Fuma. Bebe mucho alcohol. Cules son los signos o sntomas? Los sntomas frecuentes de esta afeccin Baxter [employer] siguientes: Sensacin de que el corazn late [place] rpido. Dolor o International aid/development worker. Falta de aire. Sensacin repentina de debilidad o de desvanecimiento. Cansarse con facilidad al hacer actividad fsica. Desmayos. Sudoracin. En algunos casos, no hay sntomas. Cmo se trata? Medicamentos para: Chief of Staff de [place]. Tratar los problemas de frecuencia cardaca o de ritmo cardaco. Usar dispositivos, por ejemplo, un marcapasos, para corregir problemas del ritmo cardaco. Garnetta Buddy ciruga para extirpar la parte del corazn que enva seales incorrectas. Cerrar una zona donde se pueden formar cogulos en el corazn (orejuela auricular izquierda). En algunos casos, el mdico tratar otras afecciones subyacentes. Siga estas indicaciones en su casa: Medicamentos Use los medicamentos de venta libre y los recetados solamente como se lo haya indicado el  mdico. No tome ningn medicamento nuevo sin hablar primero con su mdico. Si est tomando anticoagulantes, tenga en cuenta lo siguiente: Hable con el mdico antes de tomar aspirina o antiinflamatorios no esteroideos (AINE), como el ibuprofeno. Tome los medicamentos segn las indicaciones. Tmelos a la Smith International. No haga cosas que puedan lastimarlo o causarle moretones. Sea cuidadoso para evitar las cadas. Use una pulsera de alerta o lleve una tarjeta que diga que toma anticoagulantes. Estilo de vida No fume ni consuma ningn producto que contenga nicotina o tabaco. Si necesita ayuda para dejar de consumir estos productos, consulte al mdico. Consuma alimentos cardiosaludables. Hable con su mdico sobre el plan de alimentacin adecuado para usted. Haga ejercicios con regularidad tal como se lo indic el mdico. No beba alcohol. Baje de peso si es necesario. Indicaciones generales Si tiene un apnea del sueo, trtela segn las indicaciones del mdico. No use pldoras para bajar de peso salvo que su mdico le diga que son seguras. Estas pastillas  pueden agravar los problemas cardacos. Concurra a todas las visitas de seguimiento. El mdico verificar la frecuencia cardaca y el ritmo cardaco con regularidad. Comunquese con un mdico si: Nota un cambio en la velocidad, el ritmo o la fuerza de los latidos cardacos. Toma medicamentos anticoagulantes y tiene ms moretones. Se cansa con ms facilidad cuando se mueve o hace ejercicio. Tiene un cambio repentino [employer]. Solicite ayuda de inmediato si:  Midwife. Tiene dificultad para respirar. Tiene efectos secundarios de los anticoagulantes, como sangre en el vmito, la materia fecal (heces) o el pis (orina), o sangrado que no puede detenerse. Tiene algn signo de accidente cerebrovascular. "BE FAST" es una manera fcil de recordar las principales seales de advertencia: B: Balance (equilibrio). Mareo,  dificultad repentina para caminar o prdida del equilibrio. E: Eyes (ojos). Dificultad para ver o un cambio en cmo ve. F: Face (rostro). Debilidad repentina o prdida de la sensibilidad en la cara. Su rostro o prpado pueden caerse hacia un lado. A: Arms (brazos). Debilidad o prdida de la sensibilidad en un brazo. Esto sucede de repente y generalmente en un lado del cuerpo. S: Speech (habla). Dificultad repentina para hablar, hablar arrastrando las palabras o dificultad para comprender lo que las [employer].C. Holdings. T: Time (tiempo).Es tiempo de llamar al servicio de Sports administrator. Anote la hora a la que Albertson's sntomas. Presenta otros signos de un accidente cerebrovascular, como los siguientes: Dolor de [place] repentino y muy intenso sin causa aparente. Ganas de vomitar (nuseas). Vmitos. Una convulsin. Estos sntomas pueden Customer service manager. Solicite ayuda de inmediato. Llame al 911. No espere a ver si los sntomas desaparecen. No conduzca por sus propios medios [employer]Max Incorporated. Esta informacin no tiene Theme park manager el consejo del mdico. Asegrese de hacerle al mdico cualquier pregunta que tenga. Document Revised: 09/24/2022 Document Reviewed: 09/24/2022 Elsevier Patient Education  2024 [employer].

## 2023-07-21 NOTE — Progress Notes (Signed)
[facility]  Susan [name], is a 47 y.o. female  HQI:[id number]  XLK:[id number]  DOB - 08/19/1976  Chief Complaint  Patient presents with   Foot Swelling    B/l and feet burning at night        Subjective:   Susan [name] is a 47 y.o. Hispanic female ( interpreter Nettie Elm [phone] today for a follow up visit for T2D. Denies polyuria, polydipsia, polyphasia or vision changes.  Does not check blood sugars at home. She is c/o feet swelling and burning at nigh (bilateral). Requesting medication refill.  She does have Hyperthyroidism  and has  heartbeat normally stops in a few minutes but when it doesn't she worries explained a.fib.   No problems updated.  No Known Allergies  Past Medical History:  Diagnosis Date   Diabetes 1.5, managed as type 2 (HCC)    Hypertension     Current Outpatient Medications on File Prior to Visit  Medication Sig Dispense Refill   apixaban (ELIQUIS) 5 MG TABS tablet Take 1 tablet (5 mg total) by mouth 2 (two) times daily. 60 tablet 3   atorvastatin (LIPITOR) 20 MG tablet Take 1 tablet (20 mg total) by mouth daily. 90 tablet 1   metFORMIN (GLUCOPHAGE-XR) 500 MG 24 hr tablet Take 1 tablet (500 mg total) by mouth 2 (two) times daily. 180 tablet 1   methimazole (TAPAZOLE) 5 MG tablet Take 1 tablet (5 mg total) by mouth 3 (three) times daily. 90 tablet 0   metoprolol tartrate (LOPRESSOR) 25 MG tablet Take 0.5 tablets (12.5 mg total) by mouth 2 (two) times daily. 60 tablet 3   No current facility-administered medications on file prior to visit.    Objective:  Blood Pressure 123/64   Pulse (Abnormal) 57   Respiration 16   Weight 137 lb (62.1 kg)   Oxygen Saturation 100%   Body Mass Index 29.65 kg/m    Comprehensive ROS Pertinent positive and negative noted in HPI   Exam General appearance : Awake, alert, not in any distress. Speech Clear. Not toxic looking HEENT: Atraumatic and Normocephalic, pupils equally  reactive to light and accomodation Neck: Supple, no JVD. No cervical lymphadenopathy.  Chest: Good air entry bilaterally, no added sounds  CVS: S1 S2 regular, no murmurs.  Abdomen: Bowel sounds present, Non tender and not distended with no gaurding, rigidity or rebound. Extremities: B/L Lower Ext shows no edema, both legs are warm to touch Neurology: Awake alert, and oriented X 3, , Non focal Skin: No Rash  Data Review Lab Results  Component Value Date   HGBA1C 6.4 06/12/2023   HGBA1C 6.4 (H) 01/06/2023   HGBA1C 6.5 09/02/2022    Assessment & Plan  Susan [name] was seen today for foot swelling.  Diagnoses and all orders for this visit:  Hyperthyroidism -     methimazole (TAPAZOLE) 5 MG tablet; Take 1 tablet (5 mg total) by mouth 3 (three) times daily.  Atrial fibrillation with rapid ventricular response (HCC) Managed by cardiology   Class 1 obesity due to excess calories without serious comorbidity with body mass index (BMI) of 30.0 to 30.9 in adult Obesity is 30-39 indicating an excess in caloric intake or underlining conditions. This may lead to other co-morbidities. Educated on lifestyle modifications of diet and exercise which may reduce obesity.    Diabetes mellitus type 1.5 (HCC) - educated on lifestyle modifications, including but not limited to diet choices and adding exercise to daily routine.   -  Hemoglobin A1c; Future     Patient have been counseled extensively about nutrition and exercise. Other issues discussed during this visit include: low cholesterol diet, weight control and daily exercise, foot care, annual eye examinations at Ophthalmology, importance of adherence with medications and regular follow-up. We also discussed long term complications of uncontrolled diabetes and hypertension.   No follow-ups on file.  The patient was given clear instructions to go to ER or return to medical center if symptoms don't improve, worsen or new problems develop. The  patient verbalized understanding. The patient was told to call to get lab results if they haven't heard anything in the next week.   This note has been created with Education officer, environmental. Any transcriptional errors are unintentional.   Grayce Sessions, NP 07/21/2023, 8:40 AM

## 2023-07-21 NOTE — Addendum Note (Signed)
Addended by: Gwinda Passe on: 07/21/2023 09:20 AM   Modules accepted: Orders

## 2023-07-22 ENCOUNTER — Telehealth (HOSPITAL_COMMUNITY): Payer: Self-pay

## 2023-07-22 ENCOUNTER — Other Ambulatory Visit: Payer: Self-pay

## 2023-07-22 NOTE — Telephone Encounter (Signed)
Patient has been approved for Eliquis through [facility] Patient Assistance Program. Application Case # [id number] Approval from 07/22/2023-07/20/2024

## 2023-07-31 ENCOUNTER — Other Ambulatory Visit: Payer: Self-pay

## 2023-08-21 ENCOUNTER — Other Ambulatory Visit: Payer: Self-pay

## 2023-08-21 ENCOUNTER — Other Ambulatory Visit (INDEPENDENT_AMBULATORY_CARE_PROVIDER_SITE_OTHER): Payer: Self-pay | Admitting: Primary Care

## 2023-08-21 DIAGNOSIS — E0590 Thyrotoxicosis, unspecified without thyrotoxic crisis or storm: Secondary | ICD-10-CM

## 2023-08-21 MED ORDER — METHIMAZOLE 5 MG PO TABS
5.0000 mg | ORAL_TABLET | Freq: Three times a day (TID) | ORAL | 0 refills | Status: DC
Start: 2023-08-21 — End: 2023-09-24
  Filled 2023-08-21: qty 90, 30d supply, fill #0

## 2023-08-24 ENCOUNTER — Other Ambulatory Visit: Payer: Self-pay

## 2023-08-26 ENCOUNTER — Other Ambulatory Visit: Payer: Self-pay

## 2023-09-17 ENCOUNTER — Other Ambulatory Visit: Payer: Self-pay

## 2023-09-18 ENCOUNTER — Ambulatory Visit (INDEPENDENT_AMBULATORY_CARE_PROVIDER_SITE_OTHER): Payer: Self-pay | Admitting: Cardiology

## 2023-09-18 ENCOUNTER — Encounter (HOSPITAL_BASED_OUTPATIENT_CLINIC_OR_DEPARTMENT_OTHER): Payer: Self-pay | Admitting: Cardiology

## 2023-09-18 VITALS — BP 148/76 | HR 72 | Ht 60.0 in | Wt 148.1 lb

## 2023-09-18 DIAGNOSIS — I10 Essential (primary) hypertension: Secondary | ICD-10-CM

## 2023-09-18 DIAGNOSIS — D6869 Other thrombophilia: Secondary | ICD-10-CM

## 2023-09-18 DIAGNOSIS — Z7901 Long term (current) use of anticoagulants: Secondary | ICD-10-CM

## 2023-09-18 DIAGNOSIS — I480 Paroxysmal atrial fibrillation: Secondary | ICD-10-CM

## 2023-09-18 DIAGNOSIS — Z7189 Other specified counseling: Secondary | ICD-10-CM

## 2023-09-18 NOTE — Progress Notes (Signed)
Heart and Vascular Care Navigation  09/18/2023  Susan [name] 10-14-1976 [id number]  Reason for Referral: uninsured, medication assistance Patient is participating in a Managed Medicaid Plan:No, self pay, eligible for Emergency Medicaid only for recent hospital stay  Engaged with patient face to face for initial visit for Heart and Vascular Care Coordination.                                                                                                   Assessment:                                     LCSW introduced self, role, reason for visit. Assisted by Susan [name], Spanish language interpreter. Confirmed home address, PCP. Currently pt resides with relatives, they own their home but since she is not working she receives assistance with housing costs etc from friends and family. She relies on them also for rides, I shared if any additional transportation needs to [facility] Heart and Vascular or Primary Care offices we can assist/connect her to assistance. Pt has started receiving her Eliquis in the mail. We reviewed additional resources including [employer] and [employer] for [employer] and [employer] for other generic medications. Only other concern voiced at this time is additional food assistance- pt okay with me sending this in the mail.   No additional questions at this time.   HRT/VAS Care Coordination     Patients Home Cardiology Office --  [facility]   Living arrangements for the past 2 months Single Family Home   Lives with: Relatives   Patient Current Insurance Coverage Self-Pay   Patient Has Concern With Paying Medical Bills Yes   Patient Concerns With Medical Bills only eligible for Emergency Medicaid   Medical Bill Referrals: [facility] Financial Assistance;  [employer]   Does Patient Have Prescription Coverage? No   Patient Prescription Assistance Programs Patient Assistance Programs   Home Assistive Devices/Equipment Blood pressure cuff        Social History:                                                                             SDOH Screenings   Food Insecurity: Food Insecurity Present (09/18/2023)  Housing: Low Risk  (09/18/2023)  Transportation Needs: Unmet Transportation Needs (09/18/2023)  Utilities: Not At Risk (09/18/2023)  Depression (PHQ2-9): Low Risk  (07/21/2023)  Financial Resource Strain: Medium Risk (09/18/2023)  Tobacco Use: Low Risk  (09/18/2023)  Health Literacy: Adequate Health Literacy (09/18/2023)    SDOH Interventions: Financial Resources:  Financial Strain Interventions: Other (Comment) (pt assisted with mortgage/utilities/food by family and friends; provided community resources for food and not behind on any current mortgage/utilities) Editor, commissioning for [employer]  Food Insecurity:  Food Insecurity Interventions: Other (Comment) (provided food pantry list)  Housing Insecurity:  Housing Interventions: Intervention Not Indicated  Transportation:   Transportation Interventions: Patient Resources Dietitian) (relies on family and friends; discussed assistance to Hrt/Vas appts as needed)     Other Care Navigation Interventions:     Provided Pharmacy assistance resources Patient Assistance Programs- approved for Eliquis through [facility] and provided NCMedAssist application   Follow-up plan:   Provided pt with the following: my card, [employer] application and med list, [employer], [employer] application- all in [place]. Provided pt with instructions to bring NCMedAssist application to [facility] office and to contact Susan [name], Artist regarding [employer] (pt is a community clinic pt and must submit with Susan [name]). Pt encouraged to call me when applications complete PRIOR to submitting to ensure we are aware she is bringing them and answer any additional questions. I will check in with pt next week.

## 2023-09-18 NOTE — Patient Instructions (Signed)
Medication Instructions:  Your physician recommends that you continue on your current medications as directed. Please refer to the Current Medication list given to you today.  *If you need a refill on your cardiac medications before your next appointment, please call your pharmacy*  Follow-Up: At [facility], you and your health needs are our priority.  As part of our continuing mission to provide you with exceptional heart care, we have created designated Provider Care Teams.  These Care Teams include your primary Cardiologist (physician) and Advanced Practice Providers (APPs -  Physician Assistants and Nurse Practitioners) who all work together to provide you with the care you need, when you need it.  We recommend signing up for the patient portal called "MyChart".  Sign up information is provided on this After Visit Summary.  MyChart is used to connect with patients for Virtual Visits (Telemedicine).  Patients are able to view lab/test results, encounter notes, upcoming appointments, etc.  Non-urgent messages can be sent to your provider as well.   To learn more about what you can do with MyChart, go to [email or web address].    Your next appointment:   2-3 weeks for nurse visit for BP Check   &   1 year with Dr. Cristal Deer

## 2023-09-18 NOTE — Progress Notes (Signed)
Cardiology Office Note:  .    Date:  09/18/2023  ID:  Susan [name], DOB [birth year], MRN [id number] PCP: Grayce Sessions, NP  [facility] Providers Cardiologist:  Jodelle Red, MD     History of Present Illness: Susan [name] Kitchen    Susan [name] is a 47 y.o. female with a hx of atrial fibrillation, hypertension, hyperthyroidism, diabetes, here for the evaluation of atrial fibrillation with RVR at the request of Gwinda Passe, NP.  On 06/18/2023 she presented to the ED with complaints of palpitations, shortness of breath, and chest pressure. She was found to be in Afib with RVR and started on Cardizem drip and Eliquis; she converted to sinus rhythm. Subsequently transitioned from Cardizem to oral metoprolol BID. Blood pressure was soft so lisinopril and HCTZ was held. Incidentally she was found to have hyperthyroidism and was started on methimazole 5 mg TID. Echocardiogram showed LVEF 60-65%, normal diastolic parameters, moderately elevated PASP, mild mitral regurgitation, mild aortic regurgitation and mild aortic valve stenosis. She was recommended for follow-up with endocrinology and Afib clinic.   She was seen at the Afib clinic 07/15/2023 and was in NSR. No arrhythmic episodes in the interim. Noted to be taking 1 whole tablet of metoprolol BID instead of 1/2 tablet BID. Continued on metoprolol 12.5 mg BID.  Today, she is accompanied by a professional interpreter. Occasionally she feels chest pressure, and palpitations especially when going to sleep. Her symptoms typically resolve on their own after a brief time. She complains of chest pressure described as a squeezing/tightness that may occur up to twice a day; she hasn't experienced this in the past week. The pain is generally very mild when it occurs. Additionally she describes experiencing an occasional mild pinching sensation in her chest. The day before yesterday she had an episode associated with  nausea, lasting for a couple seconds. At times she is aware of pleuritic pain. Due to her palpitations, she is unable to tolerate sleeping on her left side regardless if she is in Afib. This has been an issue since she went to the hospital. Her symptoms have been more noticeable when she is feeling worried about something. However, she is able to walk and work without exacerbating the chest pain. She climbs stairs without symptoms.   Cardiovascular risk factors: Prior clinical ASCVD: None Comorbid conditions:  Diabetes. Hyperthyroidism - Started on methimazole during ED visit 06/2023. Hypertension - previously on lisinopril 20 mg, last taken about 2 years ago. In the office her blood pressure is initially elevated to 152/66, and 148/76 on manual recheck. She does not own a BP cuff at home. Metabolic syndrome/Obesity:  Current weight 148 lbs. Chronic inflammatory conditions:  None. Tobacco use history:  Never. Prior pertinent testing and/or incidental findings: Echocardiogram 06/20/23 with LVEF 60-65%.  Has a history of snoring prior to her hospital visit; her husband has not told her recently that she snores. No prior sleep study.  She denies any shortness of breath, peripheral edema, lightheadedness, headaches, syncope, orthopnea, or PND.  ROS:  Please see the history of present illness. ROS otherwise negative except as noted.  (+) Intermittent chest pain (+) Palpitations  Studies Reviewed: .         Echo  06/20/2023:  1. Left ventricular ejection fraction, by estimation, is 60 to 65%. The  left ventricle has normal function. The left ventricle has no regional  wall motion abnormalities. Left ventricular diastolic parameters were  normal.   2. Right ventricular systolic  function is normal. The right ventricular  size is normal. There is moderately elevated pulmonary artery systolic  pressure.   3. The mitral valve is normal in structure. Mild mitral valve  regurgitation. No evidence of  mitral stenosis.   4. The aortic valve is tricuspid. Aortic valve regurgitation is mild.  Mild aortic valve stenosis. Aortic valve mean gradient measures 10.0 mmHg.  Aortic valve Vmax measures 2.30 m/s.   5. The inferior vena cava is dilated in size with <50% respiratory  variability, suggesting right atrial pressure of 15 mmHg.   Comparison(s): No prior Echocardiogram.   Physical Exam:    VS:  BP (!) 148/76 (BP Location: Left Arm, Patient Position: Sitting, Cuff Size: Normal)   Pulse 72   Ht 5' (1.524 m)   Wt 148 lb 1.6 oz (67.2 kg)   SpO2 99%   BMI 28.92 kg/m    Wt Readings from Last 3 Encounters:  09/18/23 148 lb 1.6 oz (67.2 kg)  07/21/23 137 lb (62.1 kg)  07/15/23 143 lb 3.2 oz (65 kg)    GEN: Well nourished, well developed in no acute distress HEENT: Normal, moist mucous membranes NECK: No JVD CARDIAC: regular rhythm, normal S1 and S2, no rubs or gallops. No murmur. VASCULAR: Radial and DP pulses 2+ bilaterally. No carotid bruits RESPIRATORY:  Clear to auscultation without rales, wheezing or rhonchi  ABDOMEN: Soft, non-tender, non-distended MUSCULOSKELETAL:  Ambulates independently SKIN: Warm and dry, no edema NEUROLOGIC:  Alert and oriented x 3. No focal neuro deficits noted. PSYCHIATRIC:  Normal affect   ASSESSMENT AND PLAN: .    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 Limit salt. -recommend moderate walking, 3-5 times/week for 30-50 minutes  each session. Aim for at least 150 minutes.week. Goal should be pace of 3 miles/hours, or walking 1.5 miles in 30 minutes -recommend avoidance of tobacco products. Avoid excess alcohol. -ASCVD risk score: The ASCVD Risk score (Arnett DK, et al., 2019) failed to calculate for the following reasons:   The valid total cholesterol range is 130 to 320 mg/dL     Dispo: Follow-up in 2-3 weeks for nurse visit for BP check; 1 year with me, or sooner as needed.  I,Mathew Stumpf,acting as a Neurosurgeon for [employer], MD.,have documented all relevant documentation on the behalf of Jodelle Red, MD,as directed by  Jodelle Red, MD while in the presence of Jodelle Red, MD.  I, Jodelle Red, MD, have reviewed all documentation for this visit. The documentation on 11/08/23 for the exam, diagnosis, procedures, and orders are all accurate and complete.   Signed, Jodelle Red, MD

## 2023-09-23 ENCOUNTER — Telehealth: Payer: Self-pay | Admitting: Licensed Clinical Social Worker

## 2023-09-23 NOTE — Telephone Encounter (Signed)
H&V Care Navigation CSW Progress Note  Clinical Social Worker contacted patient by phone to f/u on patient assistance applications. Was able to reach her at [phone] with assistance of Maralyn Sago, Spanish language interpreter [phone]. She shares that she actually does work, is currently doing so but is paid in cash. We reviewed documents needed for applications, and who to bring them to when complete. Pt requested f/u next Friday, I will do so.  Patient is participating in a Managed Medicaid Plan:  No, self pay only.   SDOH Screenings   Food Insecurity: Food Insecurity Present (09/18/2023)  Housing: Low Risk  (09/18/2023)  Transportation Needs: Unmet Transportation Needs (09/18/2023)  Utilities: Not At Risk (09/18/2023)  Depression (PHQ2-9): Low Risk  (07/21/2023)  Financial Resource Strain: Medium Risk (09/18/2023)  Tobacco Use: Low Risk  (09/18/2023)  Health Literacy: Adequate Health Literacy (09/18/2023)    Octavio Graves, MSW, LCSW Clinical Social Worker II [facility] Health Heart/Vascular Care Navigation  [phone]- work cell phone (preferred) [phone]- desk phone

## 2023-09-24 ENCOUNTER — Encounter (INDEPENDENT_AMBULATORY_CARE_PROVIDER_SITE_OTHER): Payer: Self-pay | Admitting: Primary Care

## 2023-09-24 ENCOUNTER — Ambulatory Visit (INDEPENDENT_AMBULATORY_CARE_PROVIDER_SITE_OTHER): Payer: No Typology Code available for payment source | Admitting: Primary Care

## 2023-09-24 VITALS — BP 134/75 | HR 61 | Resp 16 | Wt 146.6 lb

## 2023-09-24 DIAGNOSIS — E782 Mixed hyperlipidemia: Secondary | ICD-10-CM

## 2023-09-24 DIAGNOSIS — Z760 Encounter for issue of repeat prescription: Secondary | ICD-10-CM

## 2023-09-24 DIAGNOSIS — Z7984 Long term (current) use of oral hypoglycemic drugs: Secondary | ICD-10-CM

## 2023-09-24 DIAGNOSIS — I10 Essential (primary) hypertension: Secondary | ICD-10-CM

## 2023-09-24 DIAGNOSIS — E119 Type 2 diabetes mellitus without complications: Secondary | ICD-10-CM

## 2023-09-24 DIAGNOSIS — Z2821 Immunization not carried out because of patient refusal: Secondary | ICD-10-CM

## 2023-09-24 DIAGNOSIS — Z1211 Encounter for screening for malignant neoplasm of colon: Secondary | ICD-10-CM

## 2023-09-24 DIAGNOSIS — E0590 Thyrotoxicosis, unspecified without thyrotoxic crisis or storm: Secondary | ICD-10-CM

## 2023-09-24 DIAGNOSIS — E139 Other specified diabetes mellitus without complications: Secondary | ICD-10-CM

## 2023-09-24 MED ORDER — ATORVASTATIN CALCIUM 20 MG PO TABS
20.0000 mg | ORAL_TABLET | Freq: Every day | ORAL | 1 refills | Status: DC
Start: 2023-09-24 — End: 2023-10-01

## 2023-09-24 MED ORDER — METHIMAZOLE 5 MG PO TABS
5.0000 mg | ORAL_TABLET | Freq: Three times a day (TID) | ORAL | 0 refills | Status: DC
Start: 2023-09-24 — End: 2023-09-29

## 2023-09-24 MED ORDER — METOPROLOL TARTRATE 25 MG PO TABS
12.5000 mg | ORAL_TABLET | Freq: Two times a day (BID) | ORAL | 3 refills | Status: DC
Start: 2023-09-24 — End: 2023-10-01

## 2023-09-24 MED ORDER — METFORMIN HCL ER 500 MG PO TB24
500.0000 mg | ORAL_TABLET | Freq: Two times a day (BID) | ORAL | 1 refills | Status: DC
Start: 2023-09-24 — End: 2023-10-01

## 2023-09-24 NOTE — Progress Notes (Signed)
[facility]  Susan [name], is a 47 y.o. female  ZOX:[id number]  WJX:[id number]  DOB - 1976/08/27  Chief Complaint  Patient presents with   Hyperthyroidism   Diabetes   Hypertension       Subjective:   [name] is a 47 y.o. Hispanic female here today for a follow up visit HTN, Hyperthyroidism, and T2D. Patient has No headache, No chest pain, No abdominal pain - No Nausea, No new weakness tingling or numbness, No Cough - shortness of breath. Denies polyuria, polydipsia, polyphasia or vision changes.  Does not check blood sugars at home.  Denies unintentional weight loss and a rapid or irregular heartbeat   No problems updated.  No Known Allergies  Past Medical History:  Diagnosis Date   Atrial fibrillation (HCC)    Diabetes 1.5, managed as type 2 (HCC)    DM (diabetes mellitus) (HCC)    Hypertension    Hyperthyroidism     Current Outpatient Medications on File Prior to Visit  Medication Sig Dispense Refill   apixaban (ELIQUIS) 5 MG TABS tablet Take 1 tablet (5 mg total) by mouth 2 (two) times daily. 60 tablet 3   No current facility-administered medications on file prior to visit.    Objective:   Vitals:   09/24/23 0916 09/24/23 0917  BP: (!) 142/79 134/75  Pulse: 61   Resp: 16   SpO2: 100%   Weight: 146 lb 9.6 oz (66.5 kg)     Comprehensive ROS Pertinent positive and negative noted in HPI   Exam General appearance : Awake, alert, not in any distress. Speech Clear. Not toxic looking HEENT: Atraumatic and Normocephalic, pupils equally reactive to light and accomodation Neck: Supple, no JVD. No cervical lymphadenopathy.  Chest: Good air entry bilaterally, no added sounds  CVS: S1 S2 regular, no murmurs.  Abdomen: Bowel sounds present, Non tender and not distended with no gaurding, rigidity or rebound. Extremities: B/L Lower Ext shows no edema, both legs are warm to touch Neurology: Awake alert, and oriented X 3, CN  II-XII intact, Non focal Skin: No Rash  Data Review Lab Results  Component Value Date   HGBA1C 6.4 (H) 09/24/2023   HGBA1C 6.4 06/12/2023   HGBA1C 6.4 (H) 01/06/2023    Assessment & Plan   Susan [name] was seen today for hyperthyroidism, diabetes and hypertension.  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  Hyperthyroidism -     TSH + free T4 -     methimazole (TAPAZOLE) 5 MG tablet; Take 1 tablet (5 mg total) by mouth 3 (three) times daily.  Mixed hyperlipidemia -     atorvastatin (LIPITOR) 20 MG tablet; Take 1 tablet (20 mg total) by mouth daily.  Medication refill -     atorvastatin (LIPITOR) 20 MG tablet; Take 1 tablet (20 mg total) by mouth daily. -     metFORMIN (GLUCOPHAGE-XR) 500 MG 24 hr tablet; Take 1 tablet (500 mg total) by mouth 2 (two) times daily.  Type 2 diabetes mellitus without complication, without long-term current use of insulin (HCC) -     Lipid panel -     Hemoglobin A1c stable at 6.4 (   Prediabetes is 5.7-6.4 monitor carbohydrates -rice, potatoes, tortillas, breads, pasta, sweets, sodas.  Increase exercising to help maintain appropriate weight. ) -  metFORMIN (GLUCOPHAGE-XR) 500 MG 24 hr tablet; Take 1 tablet (500 mg total) by mouth 2 (two) times daily.  Essential hypertension -     metoprolol tartrate (LOPRESSOR) 25 MG tablet; Take 0.5 tablets (12.5 mg total) by mouth 2 (two) times daily.  Colon cancer screening -     Fecal occult blood, imunochemical  Patient have been counseled extensively about nutrition and exercise. Other issues discussed during this visit include: low cholesterol diet, weight control and daily exercise, foot care, annual eye examinations at Ophthalmology, importance of adherence with medications and regular follow-up. We also discussed long term  complications of uncontrolled diabetes and hypertension.   No follow-ups on file.  The patient was given clear instructions to go to ER or return to medical center if symptoms don't improve, worsen or new problems develop. The patient verbalized understanding. The patient was told to call to get lab results if they haven't heard anything in the next week.   This note has been created with Education officer, environmental. Any transcriptional errors are unintentional.   Susan Sessions, NP 09/28/2023, 9:05 AM Susan [name] [phone]

## 2023-09-25 LAB — HEMOGLOBIN A1C
Est. average glucose Bld gHb Est-mCnc: 137 mg/dL
Hgb A1c MFr Bld: 6.4 % — ABNORMAL HIGH (ref 4.8–5.6)

## 2023-09-25 LAB — LIPID PANEL
Chol/HDL Ratio: 2.8 {ratio} (ref 0.0–4.4)
Cholesterol, Total: 113 mg/dL (ref 100–199)
HDL: 41 mg/dL (ref >39)
LDL Chol Calc (NIH): 55 mg/dL (ref 0–99)
Triglycerides: 83 mg/dL (ref 0–149)
VLDL Cholesterol Cal: 17 mg/dL (ref 5–40)

## 2023-09-25 LAB — TSH+FREE T4
Free T4: 1.32 ng/dL (ref 0.82–1.77)
TSH: <0.005 u[IU]/mL — ABNORMAL LOW (ref 0.450–4.500)

## 2023-09-29 ENCOUNTER — Other Ambulatory Visit (INDEPENDENT_AMBULATORY_CARE_PROVIDER_SITE_OTHER): Payer: Self-pay | Admitting: Primary Care

## 2023-09-29 DIAGNOSIS — E0590 Thyrotoxicosis, unspecified without thyrotoxic crisis or storm: Secondary | ICD-10-CM

## 2023-09-29 MED ORDER — METHIMAZOLE 5 MG PO TABS
5.0000 mg | ORAL_TABLET | Freq: Three times a day (TID) | ORAL | 3 refills | Status: DC
Start: 2023-09-29 — End: 2023-10-01

## 2023-09-30 ENCOUNTER — Telehealth: Payer: Self-pay | Admitting: Licensed Clinical Social Worker

## 2023-09-30 ENCOUNTER — Other Ambulatory Visit (INDEPENDENT_AMBULATORY_CARE_PROVIDER_SITE_OTHER): Payer: Self-pay | Admitting: Primary Care

## 2023-09-30 ENCOUNTER — Other Ambulatory Visit: Payer: Self-pay

## 2023-09-30 DIAGNOSIS — Z760 Encounter for issue of repeat prescription: Secondary | ICD-10-CM

## 2023-09-30 DIAGNOSIS — E0590 Thyrotoxicosis, unspecified without thyrotoxic crisis or storm: Secondary | ICD-10-CM

## 2023-09-30 DIAGNOSIS — E119 Type 2 diabetes mellitus without complications: Secondary | ICD-10-CM

## 2023-09-30 DIAGNOSIS — I10 Essential (primary) hypertension: Secondary | ICD-10-CM

## 2023-09-30 NOTE — Telephone Encounter (Signed)
H&V Care Navigation CSW Progress Note  Clinical Social Worker contacted patient by phone to f/u on patient assistance applications. Was able to reach her at [phone] with assistance of Donald Siva, [place] language interpreter [phone]. We again reviewed documents needed for applications, and who to bring them to when complete. Will f/u with pt again moving forward. Reminded her to contact Mikle Bosworth, Artist, or I when applications completed or with any questions.    Patient is participating in a Managed Medicaid Plan:  No, self pay only.   SDOH Screenings   Food Insecurity: Food Insecurity Present (09/18/2023)  Housing: Low Risk  (09/18/2023)  Transportation Needs: Unmet Transportation Needs (09/18/2023)  Utilities: Not At Risk (09/18/2023)  Depression (PHQ2-9): Low Risk  (09/24/2023)  Financial Resource Strain: Medium Risk (09/18/2023)  Tobacco Use: Low Risk  (09/24/2023)  Health Literacy: Adequate Health Literacy (09/18/2023)   Octavio Graves, MSW, LCSW Clinical Social Worker II [facility] Health Heart/Vascular Care Navigation  [phone]- work cell phone (preferred) [phone]- desk phone

## 2023-10-01 ENCOUNTER — Other Ambulatory Visit (INDEPENDENT_AMBULATORY_CARE_PROVIDER_SITE_OTHER): Payer: Self-pay

## 2023-10-01 ENCOUNTER — Other Ambulatory Visit: Payer: Self-pay

## 2023-10-01 DIAGNOSIS — I10 Essential (primary) hypertension: Secondary | ICD-10-CM

## 2023-10-01 DIAGNOSIS — E119 Type 2 diabetes mellitus without complications: Secondary | ICD-10-CM

## 2023-10-01 DIAGNOSIS — E0590 Thyrotoxicosis, unspecified without thyrotoxic crisis or storm: Secondary | ICD-10-CM

## 2023-10-01 DIAGNOSIS — Z760 Encounter for issue of repeat prescription: Secondary | ICD-10-CM

## 2023-10-01 DIAGNOSIS — E782 Mixed hyperlipidemia: Secondary | ICD-10-CM

## 2023-10-01 MED ORDER — ATORVASTATIN CALCIUM 20 MG PO TABS
20.0000 mg | ORAL_TABLET | Freq: Every day | ORAL | 1 refills | Status: DC
Start: 2023-10-01 — End: 2024-05-17
  Filled 2023-10-01 – 2023-10-02 (×2): qty 90, 90d supply, fill #0
  Filled 2024-02-09: qty 90, 90d supply, fill #1

## 2023-10-01 MED ORDER — METHIMAZOLE 5 MG PO TABS
5.0000 mg | ORAL_TABLET | Freq: Three times a day (TID) | ORAL | 3 refills | Status: DC
Start: 2023-10-01 — End: 2023-10-15
  Filled 2023-10-01 – 2023-10-02 (×2): qty 90, 30d supply, fill #0

## 2023-10-01 MED ORDER — METOPROLOL TARTRATE 25 MG PO TABS
12.5000 mg | ORAL_TABLET | Freq: Two times a day (BID) | ORAL | 3 refills | Status: DC
Start: 2023-10-01 — End: 2023-12-24
  Filled 2023-10-01 – 2023-10-02 (×2): qty 60, 60d supply, fill #0
  Filled 2023-12-18: qty 60, 60d supply, fill #1

## 2023-10-01 MED ORDER — METFORMIN HCL ER 500 MG PO TB24
500.0000 mg | ORAL_TABLET | Freq: Two times a day (BID) | ORAL | 1 refills | Status: DC
Start: 2023-10-01 — End: 2023-12-21
  Filled 2023-10-01 – 2023-10-02 (×2): qty 180, 90d supply, fill #0

## 2023-10-02 ENCOUNTER — Other Ambulatory Visit: Payer: Self-pay

## 2023-10-05 ENCOUNTER — Other Ambulatory Visit: Payer: Self-pay

## 2023-10-05 DIAGNOSIS — E0590 Thyrotoxicosis, unspecified without thyrotoxic crisis or storm: Secondary | ICD-10-CM

## 2023-10-07 ENCOUNTER — Telehealth: Payer: Self-pay | Admitting: Licensed Clinical Social Worker

## 2023-10-07 NOTE — Telephone Encounter (Signed)
H&V Care Navigation CSW Progress Note  Clinical Social Worker contacted patient by phone to f/u on assistance applications as pt has an upcoming appt on Thursday 10/31. Was able to reach pt at [phone] with assistance of Spanish language interpreter Earley Abide [phone]. Pt shares she is waiting on letter from her employer. Discussed additional supporting documents including proof of address and proof of nonfiling. Pt does not have any bank account, no additional supporting documents noted. Will f/u next week to answer any additional questions- pt aware of appt on 10/31.  Patient is participating in a Managed Medicaid Plan:  No, self pay only  SDOH Screenings   Food Insecurity: Food Insecurity Present (09/18/2023)  Housing: Low Risk  (09/18/2023)  Transportation Needs: Unmet Transportation Needs (09/18/2023)  Utilities: Not At Risk (09/18/2023)  Depression (PHQ2-9): Low Risk  (09/24/2023)  Financial Resource Strain: Medium Risk (09/18/2023)  Tobacco Use: Low Risk  (09/24/2023)  Health Literacy: Adequate Health Literacy (09/18/2023)    Octavio Graves, MSW, LCSW Clinical Social Worker II [facility] Health Heart/Vascular Care Navigation  [phone]- work cell phone (preferred) [phone]- desk phone

## 2023-10-08 ENCOUNTER — Ambulatory Visit (INDEPENDENT_AMBULATORY_CARE_PROVIDER_SITE_OTHER): Payer: Self-pay | Admitting: *Deleted

## 2023-10-08 ENCOUNTER — Other Ambulatory Visit: Payer: Self-pay

## 2023-10-08 VITALS — BP 152/70 | HR 70 | Ht 60.0 in | Wt 149.8 lb

## 2023-10-08 DIAGNOSIS — I10 Essential (primary) hypertension: Secondary | ICD-10-CM

## 2023-10-08 NOTE — Progress Notes (Cosign Needed)
   Nurse Visit   Date of Encounter: 10/08/2023 ID: Susan [name], DOB June 24, 1976, MRN [id number]  PCP:  Grayce Sessions, NP   [facility] Providers Cardiologist:  Jodelle Red, MD      Visit Details   VS:  BP (!) 152/70   Pulse 70   Ht 5' (1.524 m)   Wt 149 lb 12.8 oz (67.9 kg)   BMI 29.26 kg/m  , BMI Body mass index is 29.26 kg/m.  Wt Readings from Last 3 Encounters:  10/08/23 149 lb 12.8 oz (67.9 kg)  09/24/23 146 lb 9.6 oz (66.5 kg)  09/18/23 148 lb 1.6 oz (67.2 kg)     Reason for visit: BLOOD PRESSURE CHECK  Performed today: Vitals Changes (medications, testing, etc.) : WILL HAVE DR CHRISTOPHER REVIEW WHEN SHE IS BACK IN THE OFFICE  Length of Visit: 15 minutes  Patient in for nurse visit. Home blood pressure monitor 153/76, manual check 152/70. Home blood pressure reading average 137.5/69.6 Dr Cristal Deer covering hospital this week. Will discuss when she returns next week  Signed, Regis Bill, LPN  40/98/1191 5:52 PM

## 2023-10-09 ENCOUNTER — Other Ambulatory Visit (INDEPENDENT_AMBULATORY_CARE_PROVIDER_SITE_OTHER): Payer: Self-pay

## 2023-10-09 DIAGNOSIS — E0590 Thyrotoxicosis, unspecified without thyrotoxic crisis or storm: Secondary | ICD-10-CM

## 2023-10-09 LAB — T4, FREE: Free T4: 0.99 ng/dL (ref 0.60–1.60)

## 2023-10-09 LAB — T3, FREE: T3, Free: 4.6 pg/mL — ABNORMAL HIGH (ref 2.3–4.2)

## 2023-10-09 LAB — TSH: TSH: <0.01 u[IU]/mL — ABNORMAL LOW (ref 0.35–5.50)

## 2023-10-12 ENCOUNTER — Telehealth (HOSPITAL_BASED_OUTPATIENT_CLINIC_OR_DEPARTMENT_OTHER): Payer: Self-pay | Admitting: Licensed Clinical Social Worker

## 2023-10-12 NOTE — Telephone Encounter (Signed)
H&V Care Navigation CSW Progress Note  Clinical Social Worker contacted patient by phone to f/u on assistance applications. During previous calls pt had noted that she had been waiting on a letter from her employer prior to submitting assistance. No answer this morning at [phone]. With assistance of Luis I left a voicemail regarding applications. Will re-attempt again as able.  Patient is participating in a Managed Medicaid Plan:  no, self pay only  SDOH Screenings   Food Insecurity: Food Insecurity Present (09/18/2023)  Housing: Low Risk  (09/18/2023)  Transportation Needs: Unmet Transportation Needs (09/18/2023)  Utilities: Not At Risk (09/18/2023)  Depression (PHQ2-9): Low Risk  (09/24/2023)  Financial Resource Strain: Medium Risk (09/18/2023)  Tobacco Use: Low Risk  (09/24/2023)  Health Literacy: Adequate Health Literacy (09/18/2023)    Octavio Graves, MSW, LCSW Clinical Social Worker II [facility] Health Heart/Vascular Care Navigation  [phone]- work cell phone (preferred) [phone]- desk phone

## 2023-10-14 LAB — THYROID STIMULATING IMMUNOGLOBULIN: TSI: 385 %{baseline} — ABNORMAL HIGH (ref <140)

## 2023-10-14 LAB — TRAB (TSH RECEPTOR BINDING ANTIBODY): TRAB: 28.3 IU/L — ABNORMAL HIGH (ref <=2.00)

## 2023-10-15 ENCOUNTER — Encounter: Payer: Self-pay | Admitting: "Endocrinology

## 2023-10-15 ENCOUNTER — Other Ambulatory Visit: Payer: Self-pay

## 2023-10-15 ENCOUNTER — Ambulatory Visit (INDEPENDENT_AMBULATORY_CARE_PROVIDER_SITE_OTHER): Payer: Self-pay | Admitting: "Endocrinology

## 2023-10-15 VITALS — BP 140/74 | HR 78 | Ht 60.0 in | Wt 150.0 lb

## 2023-10-15 DIAGNOSIS — E042 Nontoxic multinodular goiter: Secondary | ICD-10-CM

## 2023-10-15 DIAGNOSIS — E0500 Thyrotoxicosis with diffuse goiter without thyrotoxic crisis or storm: Secondary | ICD-10-CM

## 2023-10-15 MED ORDER — METHIMAZOLE 10 MG PO TABS
10.0000 mg | ORAL_TABLET | Freq: Every day | ORAL | 1 refills | Status: DC
Start: 2023-10-15 — End: 2024-01-15
  Filled 2023-10-15: qty 90, 90d supply, fill #0

## 2023-10-15 NOTE — Patient Instructions (Signed)
If you notice any symptoms of worsening fatigue, fever with sore throat, loss of appetite, yellowing of eyes, dark urine, joint pains, sores in the mouth, itchy rash, light colored stools or abdominal pain, please stop the medication and call us immediately as this can be a serious side effect of the medication.

## 2023-10-15 NOTE — Progress Notes (Signed)
Outpatient Endocrinology Note Susan [place], MD  10/15/23   Susan [name] [birth year] [id number]  Referring Provider: Grayce Sessions, NP Primary Care Provider: Grayce Sessions, NP Subjective  No chief complaint on file.   Assessment & Plan  Diagnoses and all orders for this visit:  Graves disease -     methimazole (TAPAZOLE) 10 MG tablet; Take 1 tablet (10 mg total) by mouth daily. -     T4, free; Future -     TSH; Future -     T3, free; Future  Multiple thyroid nodules   Susan [name] is currently taking methimazole 5 mg qd.. Patient currently clinically and biochemically hyperthyroid.  Discussed the etiology for hyperthyroidism. Educated on thyroid axis.  Recommend the following: Take methimazole 10 mg once a day. Repeat labs in 3 months or sooner if symptoms of hyper or hypothyroidism develop.  -compliance and follow up needs    If you notice any symptoms of worsening fatigue, fever with sore throat, loss of appetite, yellowing of eyes, dark urine, joint pains, sores in the mouth, itchy rash, light colored stools or abdominal pain, please stop the medication and call Susan [name] immediately as this can be a serious side effect of the medication.  I have reviewed current medications, nurse's notes, allergies, vital signs, past medical and surgical history, family medical history, and social history for this encounter. Counseled patient on symptoms, examination findings, lab findings, imaging results, treatment decisions and monitoring and prognosis. The patient understood the recommendations and agrees with the treatment plan. All questions regarding treatment plan were fully answered.   Return in about 3 months (around 01/15/2024) for visit, labs before next visit.   Susan [place], MD  10/15/23   I have reviewed current medications, nurse's notes, allergies, vital signs, past medical and surgical history, family medical history,  and social history for this encounter. Counseled patient on symptoms, examination findings, lab findings, imaging results, treatment decisions and monitoring and prognosis. The patient understood the recommendations and agrees with the treatment plan. All questions regarding treatment plan were fully answered.   History of Present Illness Susan [name] is a 47 y.o. year old female who presents to our clinic with graves disease diagnosed in 2024?    Been on methimazole 5 mg every day since 06/2023.   Symptoms suggestive of HYPERTHYROIDISM:  weight loss  No heat intolerance No hyperdefecation  No palpitations  No, feels skipping beats   Compressive symptoms:  dysphagia  No dysphonia  No positional dyspnea (especially with simultaneous arms elevation)  Yes, some subjective SOB  Smokes  No On biotin  No Personal history of head/neck surgery/irradiation  No   06/2023 THYROID ULTRASOUND   TECHNIQUE: Ultrasound examination of the thyroid gland and adjacent soft tissues was performed.   COMPARISON:  None Available.   FINDINGS: Parenchymal Echotexture: Moderately heterogenous   Isthmus: 0.9 cm   Right lobe: 3.9 x 3.0 x 2.4 cm   Left lobe: 3.1 x 1.8 x 2.2 cm   _________________________________________________________   Estimated total number of nodules >/= 1 cm: 1   Number of spongiform nodules >/=  2 cm not described below (TR1): 0   Number of mixed cystic and solid nodules >/= 1.5 cm not described below (TR2): 0   _________________________________________________________   Diffusely heterogeneous and enlarged thyroid gland. Vascularity is within normal limits on color Doppler imaging. No significant hypervascularity.   Nodule # 1: Ill-defined solid hypoechoic nodule within  the right lower gland measures 1.1 x 0.9 x 1.2 cm. Findings are consistent with TI-RADS category 3. Given size (<1.4 cm) and appearance, this nodule does NOT meet TI-RADS criteria  for biopsy or dedicated follow-up.   Nodule # 2: Small hyperechoic nodule in the left lower gland measures 1.1 x 0.9 x 0.9 cm. Margins are ill-defined. No suspicious features. Findings are consistent with TI-RADS category 3. Given size (<1.4 cm) and appearance, this nodule does NOT meet TI-RADS criteria for biopsy or dedicated follow-up.   IMPRESSION: 1. Diffusely enlarged and heterogeneous thyroid gland. Findings suggest either chronic thyroiditis or diffuse goitrous change. 2. Two small nodules are noted incidentally, 1 in the right inferior gland in 1 in the left inferior gland. Neither nodule meets criteria to warrant biopsy or dedicated imaging follow-up. No follow-up recommended.  Physical Exam  BP (!) 140/74   Pulse 78   Ht 5' (1.524 m)   Wt 150 lb (68 kg)   SpO2 98%   BMI 29.29 kg/m  Constitutional: well developed, well nourished Head: normocephalic, atraumatic, no exophthalmos Eyes: sclera anicteric, no redness Neck: no thyromegaly, no thyroid tenderness Lungs: normal respiratory effort Neurology: alert and oriented, no fine hand tremor Skin: dry, no appreciable rashes Musculoskeletal: no appreciable defects Psychiatric: normal mood and affect  Allergies No Known Allergies  Current Medications Patient's Medications  New Prescriptions   METHIMAZOLE (TAPAZOLE) 10 MG TABLET    Take 1 tablet (10 mg total) by mouth daily.  Previous Medications   APIXABAN (ELIQUIS) 5 MG TABS TABLET    Take 1 tablet (5 mg total) by mouth 2 (two) times daily.   ATORVASTATIN (LIPITOR) 20 MG TABLET    Take 1 tablet (20 mg total) by mouth daily.   METFORMIN (GLUCOPHAGE-XR) 500 MG 24 HR TABLET    Take 1 tablet (500 mg total) by mouth 2 (two) times daily.   METOPROLOL TARTRATE (LOPRESSOR) 25 MG TABLET    Take 0.5 tablets (12.5 mg total) by mouth 2 (two) times daily.  Modified Medications   No medications on file  Discontinued Medications   METHIMAZOLE (TAPAZOLE) 5 MG TABLET    Take 1  tablet (5 mg total) by mouth 3 (three) times daily.    Past Medical History Past Medical History:  Diagnosis Date   Atrial fibrillation (HCC)    Diabetes 1.5, managed as type 2 (HCC)    DM (diabetes mellitus) (HCC)    Hypertension    Hyperthyroidism     Past Surgical History History reviewed. No pertinent surgical history.  Family History family history includes Diabetes in her father, mother, sister, and sister; Hypertension in her father.  Social History Social History   Socioeconomic History   Marital status: Single    Spouse name: Not on file   Number of children: 4   Years of education: Not on file   Highest education level: 5th grade  Occupational History   Not on file  Tobacco Use   Smoking status: Never   Smokeless tobacco: Never  Vaping Use   Vaping status: Never Used  Substance and Sexual Activity   Alcohol use: No   Drug use: No   Sexual activity: Not Currently    Birth control/protection: Condom  Other Topics Concern   Not on file  Social History Narrative   Not on file   Social Determinants of Health   Financial Resource Strain: Medium Risk (09/18/2023)   Overall Financial Resource Strain (CARDIA)    Difficulty of Paying Living  Expenses: Somewhat hard  Food Insecurity: Food Insecurity Present (09/18/2023)   Hunger Vital Sign    Worried About Running Out of Food in the Last Year: Sometimes true    Ran Out of Food in the Last Year: Sometimes true  Transportation Needs: Unmet Transportation Needs (09/18/2023)   PRAPARE - Administrator, Civil Service (Medical): Yes    Lack of Transportation (Non-Medical): Yes  Physical Activity: Not on file  Stress: Not on file  Social Connections: Not on file  Intimate Partner Violence: Not on file    Laboratory Investigations Lab Results  Component Value Date   TSH <0.01 Repeated and verified X2. (L) 10/09/2023   TSH <0.005 (L) 09/24/2023   TSH <0.010 (L) 06/19/2023   FREET4 0.99 10/09/2023    FREET4 1.32 09/24/2023   FREET4 4.47 (H) 06/19/2023     Lab Results  Component Value Date   TSI 385 (H) 10/09/2023     No components found for: "TRAB"   Lab Results  Component Value Date   CHOL 113 09/24/2023   Lab Results  Component Value Date   HDL 41 09/24/2023   Lab Results  Component Value Date   LDLCALC 55 09/24/2023   Lab Results  Component Value Date   TRIG 83 09/24/2023   Lab Results  Component Value Date   CHOLHDL 2.8 09/24/2023   Lab Results  Component Value Date   CREATININE 0.35 (L) 06/20/2023   No results found for: "GFR"    Component Value Date/Time   NA 137 06/20/2023 0848   NA 140 06/12/2023 1109   K 3.6 06/20/2023 0848   CL 113 (H) 06/20/2023 0848   CO2 18 (L) 06/20/2023 0848   GLUCOSE 144 (H) 06/20/2023 0848   BUN 13 06/20/2023 0848   BUN 15 06/12/2023 1109   CREATININE 0.35 (L) 06/20/2023 0848   CALCIUM 9.0 06/20/2023 0848   PROT 6.5 06/19/2023 1502   PROT 7.3 06/12/2023 1109   ALBUMIN 3.2 (L) 06/19/2023 1502   ALBUMIN 4.2 06/12/2023 1109   AST 22 06/19/2023 1502   ALT 37 06/19/2023 1502   ALKPHOS 81 06/19/2023 1502   BILITOT 0.7 06/19/2023 1502   BILITOT 0.3 06/12/2023 1109   GFRNONAA >60 06/20/2023 0848   GFRAA 115 10/16/2020 1017      Latest Ref Rng & Units 06/20/2023    8:48 AM 06/19/2023    3:58 AM 06/18/2023    9:37 PM  BMP  Glucose 70 - 99 mg/dL 161  096  045   BUN 6 - 20 mg/dL 13  15  20    Creatinine 0.44 - 1.00 mg/dL 4.09  8.11  9.14   Sodium 135 - 145 mmol/L 137  138  134   Potassium 3.5 - 5.1 mmol/L 3.6  3.7  3.1   Chloride 98 - 111 mmol/L 113  112  107   CO2 22 - 32 mmol/L 18  19  19    Calcium 8.9 - 10.3 mg/dL 9.0  9.0  8.9        Component Value Date/Time   WBC 7.5 06/20/2023 0848   RBC 3.93 06/20/2023 0848   HGB 10.5 (L) 06/20/2023 0848   HGB 13.1 01/06/2023 1027   HCT 32.9 (L) 06/20/2023 0848   HCT 38.8 01/06/2023 1027   PLT 154 06/20/2023 0848   PLT 229 01/06/2023 1027   MCV 83.7 06/20/2023 0848    MCV 80 01/06/2023 1027   MCH 26.7 06/20/2023 0848   MCHC  31.9 06/20/2023 0848   RDW 12.4 06/20/2023 0848   RDW 12.3 01/06/2023 1027   LYMPHSABS 2.8 01/06/2023 1027   MONOABS 0.8 06/06/2012 2030   EOSABS 0.0 01/06/2023 1027   BASOSABS 0.0 01/06/2023 1027      Parts of this note may have been dictated using voice recognition software. There may be variances in spelling and vocabulary which are unintentional. Not all errors are proofread. Please notify the Thereasa Parkin if any discrepancies are noted or if the meaning of any statement is not clear.

## 2023-10-16 ENCOUNTER — Telehealth (HOSPITAL_BASED_OUTPATIENT_CLINIC_OR_DEPARTMENT_OTHER): Payer: Self-pay | Admitting: Licensed Clinical Social Worker

## 2023-10-16 NOTE — Telephone Encounter (Signed)
H&V Care Navigation CSW Progress Note  Clinical Social Worker contacted patient by phone to f/u on assistance applications again. No answer today at [phone]. With assistance of Spanish language interpreter Hessie Diener #[id number], I left voicemail requesting return call. Will f/u one more time to see if I can engage pt to complete applications.  Patient is participating in a Managed Medicaid Plan:  no, self pay only  SDOH Screenings   Food Insecurity: Food Insecurity Present (09/18/2023)  Housing: Low Risk  (09/18/2023)  Transportation Needs: Unmet Transportation Needs (09/18/2023)  Utilities: Not At Risk (09/18/2023)  Depression (PHQ2-9): Low Risk  (09/24/2023)  Financial Resource Strain: Medium Risk (09/18/2023)  Tobacco Use: Low Risk  (10/15/2023)  Health Literacy: Adequate Health Literacy (09/18/2023)    Octavio Graves, MSW, LCSW Clinical Social Worker II [facility] Heart/Vascular Care Navigation  [phone]- work cell phone (preferred) [phone]- desk phone

## 2023-10-22 ENCOUNTER — Telehealth: Payer: Self-pay | Admitting: Licensed Clinical Social Worker

## 2023-10-22 NOTE — Telephone Encounter (Signed)
H&V Care Navigation CSW Progress Note  Clinical Social Worker contacted patient by phone to f/u on assistance applications again. No answer today at [phone]. With assistance of Spanish language interpreter Florence, #[id number], I left voicemail requesting return call. Remain available should pt return my call.   Patient is participating in a Managed Medicaid Plan:  No, self pay only  SDOH Screenings   Food Insecurity: Food Insecurity Present (09/18/2023)  Housing: Low Risk  (09/18/2023)  Transportation Needs: Unmet Transportation Needs (09/18/2023)  Utilities: Not At Risk (09/18/2023)  Depression (PHQ2-9): Low Risk  (09/24/2023)  Financial Resource Strain: Medium Risk (09/18/2023)  Tobacco Use: Low Risk  (10/15/2023)  Health Literacy: Adequate Health Literacy (09/18/2023)   Octavio Graves, MSW, LCSW Clinical Social Worker II [facility] Heart/Vascular Care Navigation  [phone]- work cell phone (preferred) [phone]- desk phone

## 2023-11-06 ENCOUNTER — Ambulatory Visit (HOSPITAL_COMMUNITY)
Admission: EM | Admit: 2023-11-06 | Discharge: 2023-11-06 | Disposition: A | Discharge status: Home / Self Care | Payer: Self-pay | Attending: Internal Medicine | Admitting: Internal Medicine

## 2023-11-06 ENCOUNTER — Encounter (HOSPITAL_COMMUNITY): Payer: Self-pay | Admitting: Emergency Medicine

## 2023-11-06 DIAGNOSIS — H109 Unspecified conjunctivitis: Secondary | ICD-10-CM

## 2023-11-06 DIAGNOSIS — J069 Acute upper respiratory infection, unspecified: Secondary | ICD-10-CM

## 2023-11-06 MED ORDER — AMOXICILLIN-POT CLAVULANATE 875-125 MG PO TABS: 1.0000 | ORAL_TABLET | Freq: Two times a day (BID) | ORAL | 0 refills | Status: DC

## 2023-11-06 MED ORDER — ERYTHROMYCIN 5 MG/GM OP OINT: TOPICAL_OINTMENT | OPHTHALMIC | 0 refills | Status: DC

## 2023-11-06 NOTE — ED Notes (Signed)
Daphine Deutscher #[id number]

## 2023-11-06 NOTE — ED Triage Notes (Signed)
Pt c/o headache and congestion for 2 weeks. Her right eye became red and started draining two days ago

## 2023-11-06 NOTE — ED Provider Notes (Signed)
[facility]    CSN: [id number] Arrival date & time: 11/06/23  7253      History   Chief Complaint Chief Complaint  Patient presents with   Eye Problem   Nasal Congestion   Headache    HPI Susan [name] is a 47 y.o. female.   Patient presents with a 2-week history of headache and nasal congestion.  Reports intermittent minimal coughing.  Denies any recent fever.  Denies chest pain or shortness of breath.  Denies any known sick contacts.  She developed right eye redness and purulent drainage yesterday.  Denies trauma or foreign body to the eye.  Denies blurry vision.  She does not wear contacts or glasses.  This is the first time she has been seen since symptoms started.  She has taken Tylenol for symptoms.   Eye Problem Headache   Past Medical History:  Diagnosis Date   Atrial fibrillation (HCC)    Diabetes 1.5, managed as type 2 (HCC)    DM (diabetes mellitus) (HCC)    Hypertension    Hyperthyroidism     Patient Active Problem List   Diagnosis Date Noted   Hypercoagulable state due to paroxysmal atrial fibrillation (HCC) 07/15/2023   Atrial fibrillation with rapid ventricular response (HCC) 06/19/2023   Diabetes mellitus type 1.5 (HCC) 06/19/2023   HTN (hypertension) 06/19/2023   Screening breast examination 11/10/2019    History reviewed. No pertinent surgical history.  OB History     Gravida  5   Para  4   Term  4   Preterm      AB  1   Living  4      SAB      IAB  1   Ectopic      Multiple      Live Births               Home Medications    Prior to Admission medications   Medication Sig Start Date End Date Taking? Authorizing Provider  amoxicillin-clavulanate (AUGMENTIN) 875-125 MG tablet Take 1 tablet by mouth every 12 (twelve) hours. 11/06/23  Yes Ilija Maxim, Rolly Salter E, FNP  erythromycin ophthalmic ointment Place a 1/2 inch ribbon of ointment into the lower eyelid 4 times daily for 7 days. 11/06/23  Yes  Zeshan Sena, Acie Fredrickson, FNP  apixaban (ELIQUIS) 5 MG TABS tablet Take 1 tablet (5 mg total) by mouth 2 (two) times daily. 07/15/23   Eustace Pen, PA-C  atorvastatin (LIPITOR) 20 MG tablet Take 1 tablet (20 mg total) by mouth daily. 10/01/23   Grayce Sessions, NP  metFORMIN (GLUCOPHAGE-XR) 500 MG 24 hr tablet Take 1 tablet (500 mg total) by mouth 2 (two) times daily. 10/01/23   Grayce Sessions, NP  methimazole (TAPAZOLE) 10 MG tablet Take 1 tablet (10 mg total) by mouth daily. 10/15/23 01/13/24  Altamese Pace, MD  metoprolol tartrate (LOPRESSOR) 25 MG tablet Take 0.5 tablets (12.5 mg total) by mouth 2 (two) times daily. 10/01/23   Grayce Sessions, NP    Family History Family History  Problem Relation Age of Onset   Diabetes Mother    Diabetes Father    Hypertension Father    Diabetes Sister    Diabetes Sister    Breast cancer Neg Hx     Social History Social History   Tobacco Use   Smoking status: Never   Smokeless tobacco: Never  Vaping Use   Vaping status: Never Used  Substance Use Topics  Alcohol use: No   Drug use: No     Allergies   Patient has no known allergies.   Review of Systems Review of Systems Per HPI  Physical Exam Triage Vital Signs ED Triage Vitals  Encounter Vitals Group     BP 11/06/23 1015 (!) 146/75     Systolic BP Percentile --      Diastolic BP Percentile --      Pulse Rate 11/06/23 1015 74     Resp 11/06/23 1015 17     Temp 11/06/23 1015 97.8 F (36.6 C)     Temp Source 11/06/23 1015 Oral     SpO2 11/06/23 1015 97 %     Weight --      Height --      Head Circumference --      Peak Flow --      Pain Score 11/06/23 1013 8     Pain Loc --      Pain Education --      Exclude from Growth Chart --    No data found.  Updated Vital Signs BP (!) 146/75 (BP Location: Right Arm)   Pulse 74   Temp 97.8 F (36.6 C) (Oral)   Resp 17   LMP 11/04/2023 (Exact Date)   SpO2 97%   Visual Acuity Right Eye Distance:   Left Eye  Distance:   Bilateral Distance:    Right Eye Near:   Left Eye Near:    Bilateral Near:     Physical Exam Constitutional:      General: She is not in acute distress.    Appearance: Normal appearance. She is not toxic-appearing or diaphoretic.  HENT:     Head: Normocephalic and atraumatic.     Right Ear: Tympanic membrane and ear canal normal.     Left Ear: Tympanic membrane and ear canal normal.     Nose: Congestion present.     Mouth/Throat:     Mouth: Mucous membranes are moist.     Pharynx: No posterior oropharyngeal erythema.  Eyes:     General: Lids are everted, no foreign bodies appreciated. Vision grossly intact. Gaze aligned appropriately.     Extraocular Movements: Extraocular movements intact.     Conjunctiva/sclera:     Right eye: Right conjunctiva is injected. No chemosis, exudate or hemorrhage.    Left eye: Left conjunctiva is not injected. No chemosis or exudate.    Pupils: Pupils are equal, round, and reactive to light.  Cardiovascular:     Rate and Rhythm: Normal rate and regular rhythm.     Pulses: Normal pulses.     Heart sounds: Normal heart sounds.  Pulmonary:     Effort: Pulmonary effort is normal. No respiratory distress.     Breath sounds: Normal breath sounds. No stridor. No wheezing, rhonchi or rales.  Abdominal:     General: Abdomen is flat. Bowel sounds are normal.     Palpations: Abdomen is soft.  Musculoskeletal:        General: Normal range of motion.     Cervical back: Normal range of motion.  Skin:    General: Skin is warm and dry.  Neurological:     General: No focal deficit present.     Mental Status: She is alert and oriented to person, place, and time. Mental status is at baseline.  Psychiatric:        Mood and Affect: Mood normal.        Behavior: Behavior normal.  UC Treatments / Results  Labs (all labs ordered are listed, but only abnormal results are displayed) Labs Reviewed - No data to display  EKG   Radiology No  results found.  Procedures Procedures (including critical care time)  Medications Ordered in UC Medications - No data to display  Initial Impression / Assessment and Plan / UC Course  I have reviewed the triage vital signs and the nursing notes.  Pertinent labs & imaging results that were available during my care of the patient were reviewed by me and considered in my medical decision making (see chart for details).     Given duration of nasal congestion, suspect secondary bacterial infection versus sinus infection so will treat with Augmentin antibiotic.  There are no adventitious lung sounds on exam so do not think that chest imaging is necessary.  Physical exam of her eye is concerning for bacterial conjunctivitis so will treat with erythromycin ointment.  Visual acuity appears normal.  Advised strict follow precautions if any symptoms persist or worsen.  Patient verbalized understanding and was agreeable with plan.  Interpreter used throughout patient interaction. Final Clinical Impressions(s) / UC Diagnoses   Final diagnoses:  Acute upper respiratory infection  Bacterial conjunctivitis of right eye     Discharge Instructions      I have prescribed an antibiotic for upper respiratory infection as well as an antibiotic that will go in your eye for pinkeye.  Follow-up if any symptoms persist or worsen.    ED Prescriptions     Medication Sig Dispense Auth. Provider   amoxicillin-clavulanate (AUGMENTIN) 875-125 MG tablet Take 1 tablet by mouth every 12 (twelve) hours. 14 tablet Cavalier, [place] E, [place]   erythromycin ophthalmic ointment Place a 1/2 inch ribbon of ointment into the lower eyelid 4 times daily for 7 days. 3.5 g Gustavus Bryant, [place]      PDMP not reviewed this encounter.   Gustavus Bryant, [place] 11/06/23 1150

## 2023-11-06 NOTE — Discharge Instructions (Signed)
I have prescribed an antibiotic for upper respiratory infection as well as an antibiotic that will go in your eye for pinkeye.  Follow-up if any symptoms persist or worsen.

## 2023-11-08 ENCOUNTER — Encounter (HOSPITAL_BASED_OUTPATIENT_CLINIC_OR_DEPARTMENT_OTHER): Payer: Self-pay | Admitting: Cardiology

## 2023-11-16 ENCOUNTER — Ambulatory Visit (INDEPENDENT_AMBULATORY_CARE_PROVIDER_SITE_OTHER): Payer: Self-pay | Admitting: Primary Care

## 2023-12-14 ENCOUNTER — Encounter (INDEPENDENT_AMBULATORY_CARE_PROVIDER_SITE_OTHER): Payer: Self-pay

## 2023-12-14 ENCOUNTER — Ambulatory Visit (INDEPENDENT_AMBULATORY_CARE_PROVIDER_SITE_OTHER): Payer: No Typology Code available for payment source | Admitting: Primary Care

## 2023-12-18 ENCOUNTER — Other Ambulatory Visit (HOSPITAL_BASED_OUTPATIENT_CLINIC_OR_DEPARTMENT_OTHER): Payer: Self-pay

## 2023-12-18 ENCOUNTER — Other Ambulatory Visit: Payer: Self-pay

## 2023-12-21 ENCOUNTER — Encounter (INDEPENDENT_AMBULATORY_CARE_PROVIDER_SITE_OTHER): Payer: Self-pay | Admitting: Primary Care

## 2023-12-21 ENCOUNTER — Ambulatory Visit (INDEPENDENT_AMBULATORY_CARE_PROVIDER_SITE_OTHER): Payer: Self-pay | Admitting: Primary Care

## 2023-12-21 ENCOUNTER — Other Ambulatory Visit (HOSPITAL_COMMUNITY)
Admission: RE | Admit: 2023-12-21 | Discharge: 2023-12-21 | Disposition: A | Discharge status: Home / Self Care | Payer: Self-pay | Source: Ambulatory Visit | Attending: Primary Care | Admitting: Primary Care

## 2023-12-21 ENCOUNTER — Other Ambulatory Visit: Payer: Self-pay

## 2023-12-21 VITALS — BP 137/77 | HR 82 | Resp 16 | Ht <= 58 in | Wt 148.0 lb

## 2023-12-21 DIAGNOSIS — Z760 Encounter for issue of repeat prescription: Secondary | ICD-10-CM

## 2023-12-21 DIAGNOSIS — I10 Essential (primary) hypertension: Secondary | ICD-10-CM

## 2023-12-21 DIAGNOSIS — Z124 Encounter for screening for malignant neoplasm of cervix: Secondary | ICD-10-CM | POA: Insufficient documentation

## 2023-12-21 DIAGNOSIS — Z7984 Long term (current) use of oral hypoglycemic drugs: Secondary | ICD-10-CM

## 2023-12-21 DIAGNOSIS — E119 Type 2 diabetes mellitus without complications: Secondary | ICD-10-CM

## 2023-12-21 DIAGNOSIS — N926 Irregular menstruation, unspecified: Secondary | ICD-10-CM

## 2023-12-21 DIAGNOSIS — Z113 Encounter for screening for infections with a predominantly sexual mode of transmission: Secondary | ICD-10-CM | POA: Insufficient documentation

## 2023-12-21 DIAGNOSIS — M25561 Pain in right knee: Secondary | ICD-10-CM

## 2023-12-21 DIAGNOSIS — Z3202 Encounter for pregnancy test, result negative: Secondary | ICD-10-CM

## 2023-12-21 LAB — POCT URINE PREGNANCY: Preg Test, Ur: NEGATIVE

## 2023-12-21 MED ORDER — LISINOPRIL 5 MG PO TABS
5.0000 mg | ORAL_TABLET | Freq: Every day | ORAL | 1 refills | Status: DC
  Filled 2023-12-21: qty 90, 90d supply, fill #0
  Filled 2024-03-23: qty 90, 90d supply, fill #1

## 2023-12-21 MED ORDER — IBUPROFEN 400 MG PO TABS
400.0000 mg | ORAL_TABLET | Freq: Three times a day (TID) | ORAL | 1 refills | Status: DC | PRN
  Filled 2023-12-21: qty 90, 30d supply, fill #0
  Filled 2024-03-23: qty 90, 30d supply, fill #1

## 2023-12-21 MED ORDER — METFORMIN HCL ER 500 MG PO TB24
500.0000 mg | ORAL_TABLET | Freq: Two times a day (BID) | ORAL | 1 refills | Status: DC
  Filled 2023-12-21: qty 180, 90d supply, fill #0

## 2023-12-21 NOTE — Patient Instructions (Signed)
 Perimenopausia Perimenopause La perimenopausia es el momento normal de la vida de una mujer en el que los niveles de [place], la hormona femenina producida por los ovarios, comienzan a disminuir. Esto provoca cambios en los perodos menstruales antes de que se detengan por completo (menopausia). La perimenopausia puede comenzar de 2 a 8 aos antes de [employer]. Durante la perimenopausia, los ovarios pueden o no producir vulos y Architectural technologist an puede quedar [place]. Cules son las causas? La perimenopausia es causada por un cambio natural en los niveles hormonales que se produce a medida que las mujeres envejecen. Qu incrementa el riesgo? Es ms probable que la perimenopausia comience a una edad ms temprana si tiene ciertas afecciones o se ha realizado ciertos tratamientos, incluidos los siguientes: Un tumor en la hipfisis del cerebro. Una enfermedad que afecte los ovarios y la produccin de hormonas. Ciertos tratamientos para el cncer, como quimioterapia o terapia hormonal, o radioterapia en la pelvis. Fumar mucho y consumir alcohol de forma excesiva. Antecedentes familiares de menopausia temprana. Cules son los signos o sntomas? Los cambios por la perimenopausia afectan de [place] diferente a Journalist, newspaper. Los sntomas de esta afeccin pueden incluir los siguientes: Engineer, maintenance. Perodos menstruales irregulares. Sudoracin nocturna. Cambios en el sentimiento respecto de las [employer]. Es posible que el deseo sexual disminuya o que se sienta ms incmoda respecto de su sexualidad. Sequedad vaginal. Dolores de cabeza. Cambios en el estado de nimo. Depresin. Problemas para dormir (insomnio). Problemas de memoria o dificultad para concentrarse. Irritabilidad. Cansancio. Aumento de [place]. Ansiedad. Problemas para quedar embarazada. Cmo se diagnostica? Esta afeccin se diagnostica en funcin de los antecedentes mdicos, un examen fsico, la edad, los antecedentes  menstruales y los sntomas. Tambin le podran realizar estudios hormonales. Cmo se trata? En algunos los casos, no se necesita tratamiento. Usted y el mdico deben decidir juntos si se debe Pensions consultant. El tratamiento se determinar en funcin de su cuadro clnico y de sus preferencias. Existen varios tratamientos disponibles, como: Terapia hormonal para la menopausia. Medicamentos para tratar sntomas especficos. Acupuntura. Vitaminas o suplementos herbales. Antes de Microbiologist, es importante que le avise al mdico si tiene antecedentes personales o familiares de lo siguiente: Enfermedad cardaca. Cncer de mama. Cogulos de [place]. Diabetes. Osteoporosis. Siga estas instrucciones en su casa: Medicamentos Use los medicamentos de venta libre y los recetados solamente como se lo haya indicado el mdico. Tome los suplementos vitamnicos solamente como se lo haya indicado el mdico. Hable con el mdico antes de Corporate investment banker a tomar cualquier suplemento herbal. Estilo de vida  No consuma ningn producto que contenga nicotina o tabaco, como cigarrillos, cigarrillos electrnicos y tabaco de Theatre manager. Si necesita ayuda para dejar de consumir estos productos, consulte al mdico. Realice, por lo menos, 30 minutos de actividad fsica 5 das por semana o ms. Siga una dieta equilibrada que incluya frutas y verduras frescas, cereales integrales, soja, huevos, carnes magras y lcteos descremados. Evite las bebidas con alcohol o cafena, as como las [employer] Esto podra ayudar a prevenir los acaloramientos. Intente dormir de 7 a 8 horas todas las noches. Vstase con capas de prendas que pueda quitarse para Clinical cytogeneticist. Encuentre modos de [employer], por ejemplo, a travs de la respiracin, meditacin o un diario ntimo. Indicaciones generales  Lleve un registro de sus perodos menstruales; incluya lo siguiente: El momento en que  ocurren. Qu tan abundantes son y cunto duran. Cunto tiempo transcurre entre cada perodo menstrual. Lleve un registro de  los sntomas; anote cundo comienzan, con qu frecuencia ocurren y cunto duran. Use lubricantes o humectantes vaginales para aliviar la sequedad vaginal y Personnel officer durante las relaciones sexuales. An puede quedar embarazada si tiene periodos irregulares. Asegrese de [employer] anticonceptivos durante la perimenopausia si no desea quedar embarazada. Cumpla con todas las visitas de seguimiento. Esto es importante. Esto incluye la terapia grupal y la psicoterapia. Comunquese con un mdico si: Tiene una hemorragia vaginal abundante o despide cogulos de sangre. Su perodo menstrual dura ms de 2 das que lo habitual. Sus perodos [employer] se producen con Counsellor que [place]. Sangra despus de [employer]. Siente dolor durante las [employer]. Solicite ayuda de inmediato si tiene: Journalist, newspaper, dificultad para respirar o dificultad para hablar. Depresin grave. Dolor al Beatrix Shipper. Dolor de cabeza intenso. Problemas de visin. Resumen La perimenopausia es el perodo en el cual el cuerpo de la mujer comienza a pasar a la menopausia. Esto puede suceder naturalmente o como consecuencia de otros problemas de salud o tratamientos mdicos. La perimenopausia puede [employer] 2 y 8 aos antes de la menopausia y suele durar varios aos. Los sntomas de la perimenopausia pueden controlarse con medicamentos, cambios en el estilo de vida y tratamientos complementarios, como la acupuntura. Esta informacin no tiene Theme park manager el consejo del mdico. Asegrese de hacerle al mdico cualquier pregunta que tenga. Document Revised: 06/26/2020 Document Reviewed: 06/26/2020 Elsevier Patient Education  2024 [employer].

## 2023-12-21 NOTE — Progress Notes (Signed)
 [facility]  WELL-WOMAN PHYSICAL & PAP Patient name: Susan [name] MRN [id number]  Date of birth: 04-23-1976 Chief Complaint:   Diabetes, Hypertension, Gynecologic Exam, and Knee Pain  History of Present Illness:   Susan [name] is a 48 y.o. [phone] female being seen today for a routine well-woman exam.   CC:gyn   Dorthea interpreter [phone]  The current method of family planning is none.  Patient's last menstrual period was 10/15/2023. Last pap 11/12/20. Results were: normal bacterial vaginosis  Last mammogram: 04/09/23 . Results were: normal. Family h/o breast cancer: No Last colonoscopy: cologuard brought in with visit . Family h/o colorectal cancer: No  Health Maintenance  Topic Date Due   Pneumococcal Vaccination (1 of 2 - PCV) Never done   Complete foot exam   Never done   Eye exam for diabetics  Never done   Stool Blood Test  Never done   Colon Cancer Screening  Never done   COVID-19 Vaccine (1 - 2024-25 season) Never done   Pap with HPV screening  11/13/2023   Yearly kidney health urinalysis for diabetes  01/07/2024   Flu Shot  03/07/2024*   Hemoglobin A1C  03/24/2024   Yearly kidney function blood test for diabetes  06/19/2024   DTaP/Tdap/Td vaccine (2 - Td or Tdap) 04/14/2027   Hepatitis C Screening  Completed   HIV Screening  Completed   HPV Vaccine  Aged Out  *Topic was postponed. The date shown is not the original due date.   Review of Systems:    Denies any headaches, blurred vision, fatigue, shortness of breath, chest pain, abdominal pain, abnormal vaginal discharge/itching/odor/irritation, problems with periods, bowel movements, urination, or intercourse unless otherwise stated above.  Pertinent History Reviewed:   Reviewed past medical,surgical, social and family history.  Reviewed problem list, medications and allergies.  Physical Assessment:   Vitals:   12/21/23 1024 12/21/23 1026  BP: (!) 160/82 137/77   Pulse: 82   Resp: 16   SpO2: 99%   Weight: 148 lb (67.1 kg)   Height: 4' 9.5 (1.461 m)   Body mass index is 31.47 kg/m.        Physical Examination:  General appearance - well appearing, and in no distress Mental status - alert, oriented to person, place, and time Psych:  She has a normal mood and affect Skin - warm and dry, normal color, no suspicious lesions noted Chest - effort normal, all lung fields clear to auscultation bilaterally Heart - normal rate and regular rhythm Neck:  midline trachea, no thyromegaly or nodules Breasts - breasts appear normal, no suspicious masses, no skin or nipple changes or axillary nodes Educated patient on proper self breast examination and had patient to demonstrate SBE. Abdomen - soft, nontender, nondistended, no masses or organomegaly Pelvic-VULVA: normal appearing vulva with no masses, tenderness or lesions   VAGINA: normal appearing vagina with normal color and discharge, no lesions   CERVIX: normal appearing cervix without discharge or lesions, no CMT UTERUS: uterus is felt to be normal size, shape, consistency and nontender  ADNEXA: No adnexal masses or tenderness noted. Extremities:  No swelling or varicosities noted  No results found for this or any previous visit (from the past 24 hours).   Assessment & Plan:  Susan [name] was seen today for diabetes, hypertension, gynecologic exam and knee pain.  Diagnoses and all orders for this visit:  Type 2 diabetes mellitus without complication, without long-term current use of insulin  (HCC) A1C stable  11 months 6.4 -     CBC with Differential/Platelet -     CMP14+EGFR -     Microalbumin / creatinine urine ratio  Essential hypertension BP goal - < 130/80 Explained that having normal blood pressure is the goal and medications are helping to get to goal and maintain normal blood pressure. DIET: Limit salt intake, read nutrition labels to check salt content, limit fried and high fatty foods   Avoid using multisymptom OTC cold preparations that generally contain sudafed which can rise BP. Consult with pharmacist on best cold relief products to use for persons with HTN EXERCISE Discussed incorporating exercise such as walking - 30 minutes most days of the week and can do in 10 minute intervals    -     CBC with Differential/Platelet -     CMP14+EGFR  Mixed hyperlipidemia Completed 12/24/23 wnl Continue atorvastatin  20mg  nightly  Cervical cancer screening -     Cytology - PAP  Screening for STD (sexually transmitted disease) -     Cervicovaginal ancillary only  Missed period -     POCT urine pregnancy negative   Chronic pain of right knee  Denies trauma not working - prescribed NSAID 600mg  prn Q8   Orders Placed This Encounter  Procedures   CBC with Differential/Platelet   CMP14+EGFR   Microalbumin / creatinine urine ratio   POCT urine pregnancy    Meds:  Meds ordered this encounter  Medications   lisinopril  (ZESTRIL ) 5 MG tablet    Sig: Take 1 tablet (5 mg total) by mouth daily.    Dispense:  90 tablet    Refill:  1    Supervising Provider:   JEGEDE, OLUGBEMIGA E [[id number]]   metFORMIN  (GLUCOPHAGE -XR) 500 MG 24 hr tablet    Sig: Take 1 tablet (500 mg total) by mouth 2 (two) times daily.    Dispense:  180 tablet    Refill:  1    Supervising Provider:   JEGEDE, OLUGBEMIGA E [[id number]]   ibuprofen  (ADVIL ) 400 MG tablet    Sig: Take 1 tablet (400 mg total) by mouth every 8 (eight) hours as needed for moderate pain (pain score 4-6).    Dispense:  90 tablet    Refill:  1    Supervising Provider:   MORRELL PRECIOUS BRAVO [[id number]]    Follow-up: Return in about 3 months (around 03/20/2024) for medical conditions.  This note has been created with Education officer, environmental. Any transcriptional errors are unintentional.   Susan SHAUNNA Bohr, NP 12/21/2023, 2:01 PM

## 2023-12-22 ENCOUNTER — Other Ambulatory Visit: Payer: Self-pay

## 2023-12-22 LAB — CMP14+EGFR
ALT: 37 [IU]/L — ABNORMAL HIGH (ref 0–32)
AST: 27 [IU]/L (ref 0–40)
Albumin: 4.4 g/dL (ref 3.9–4.9)
Alkaline Phosphatase: 120 [IU]/L (ref 44–121)
BUN/Creatinine Ratio: 19 (ref 9–23)
BUN: 11 mg/dL (ref 6–24)
Bilirubin Total: 0.3 mg/dL (ref 0.0–1.2)
CO2: 19 mmol/L — ABNORMAL LOW (ref 20–29)
Calcium: 9.6 mg/dL (ref 8.7–10.2)
Chloride: 104 mmol/L (ref 96–106)
Creatinine, Ser: 0.57 mg/dL (ref 0.57–1.00)
Globulin, Total: 3.3 g/dL (ref 1.5–4.5)
Glucose: 116 mg/dL — ABNORMAL HIGH (ref 70–99)
Potassium: 4.8 mmol/L (ref 3.5–5.2)
Sodium: 139 mmol/L (ref 134–144)
Total Protein: 7.7 g/dL (ref 6.0–8.5)
eGFR: 112 mL/min/{1.73_m2} (ref >59)

## 2023-12-22 LAB — MICROALBUMIN / CREATININE URINE RATIO
Creatinine, Urine: 120 mg/dL
Microalb/Creat Ratio: 5 mg/g{creat} (ref 0–29)
Microalbumin, Urine: 6.4 ug/mL

## 2023-12-22 LAB — CBC WITH DIFFERENTIAL/PLATELET
Basophils Absolute: 0 10*3/uL (ref 0.0–0.2)
Basos: 0 %
EOS (ABSOLUTE): 0 10*3/uL (ref 0.0–0.4)
Eos: 0 %
Hematocrit: 37.6 % (ref 34.0–46.6)
Hemoglobin: 11.2 g/dL (ref 11.1–15.9)
Immature Grans (Abs): 0 10*3/uL (ref 0.0–0.1)
Immature Granulocytes: 0 %
Lymphocytes Absolute: 3.4 10*3/uL — ABNORMAL HIGH (ref 0.7–3.1)
Lymphs: 39 %
MCH: 20.9 pg — ABNORMAL LOW (ref 26.6–33.0)
MCHC: 29.8 g/dL — ABNORMAL LOW (ref 31.5–35.7)
MCV: 70 fL — ABNORMAL LOW (ref 79–97)
Monocytes Absolute: 0.8 10*3/uL (ref 0.1–0.9)
Monocytes: 9 %
Neutrophils Absolute: 4.6 10*3/uL (ref 1.4–7.0)
Neutrophils: 52 %
Platelets: 269 10*3/uL (ref 150–450)
RBC: 5.35 x10E6/uL — ABNORMAL HIGH (ref 3.77–5.28)
RDW: 16.6 % — ABNORMAL HIGH (ref 11.7–15.4)
WBC: 8.9 10*3/uL (ref 3.4–10.8)

## 2023-12-23 ENCOUNTER — Other Ambulatory Visit (INDEPENDENT_AMBULATORY_CARE_PROVIDER_SITE_OTHER): Payer: Self-pay | Admitting: Primary Care

## 2023-12-23 ENCOUNTER — Other Ambulatory Visit: Payer: Self-pay

## 2023-12-23 DIAGNOSIS — B379 Candidiasis, unspecified: Secondary | ICD-10-CM

## 2023-12-23 DIAGNOSIS — B9689 Other specified bacterial agents as the cause of diseases classified elsewhere: Secondary | ICD-10-CM

## 2023-12-23 LAB — CERVICOVAGINAL ANCILLARY ONLY
Bacterial Vaginitis (gardnerella): POSITIVE — AB
Candida Glabrata: NEGATIVE
Candida Vaginitis: POSITIVE — AB
Chlamydia: NEGATIVE
Comment: NEGATIVE
Comment: NEGATIVE
Comment: NEGATIVE
Comment: NEGATIVE
Comment: NEGATIVE
Comment: NORMAL
Neisseria Gonorrhea: NEGATIVE
Trichomonas: NEGATIVE

## 2023-12-23 LAB — FECAL OCCULT BLOOD, IMMUNOCHEMICAL: Fecal Occult Bld: NEGATIVE

## 2023-12-23 MED ORDER — FLUCONAZOLE 150 MG PO TABS
150.0000 mg | ORAL_TABLET | Freq: Every day | ORAL | 1 refills | Status: DC
  Filled 2023-12-23: qty 1, 1d supply, fill #0

## 2023-12-23 MED ORDER — METRONIDAZOLE 500 MG PO TABS
500.0000 mg | ORAL_TABLET | Freq: Two times a day (BID) | ORAL | 0 refills | Status: DC
  Filled 2023-12-23: qty 14, 7d supply, fill #0

## 2023-12-24 ENCOUNTER — Ambulatory Visit (HOSPITAL_COMMUNITY)
Admission: RE | Admit: 2023-12-24 | Discharge: 2023-12-24 | Disposition: A | Discharge status: Home / Self Care | Payer: Self-pay | Source: Ambulatory Visit | Attending: Internal Medicine | Admitting: Internal Medicine

## 2023-12-24 ENCOUNTER — Other Ambulatory Visit: Payer: Self-pay

## 2023-12-24 VITALS — BP 132/60 | HR 81 | Ht <= 58 in | Wt 153.2 lb

## 2023-12-24 DIAGNOSIS — I4891 Unspecified atrial fibrillation: Secondary | ICD-10-CM

## 2023-12-24 DIAGNOSIS — Z7901 Long term (current) use of anticoagulants: Secondary | ICD-10-CM | POA: Insufficient documentation

## 2023-12-24 DIAGNOSIS — I080 Rheumatic disorders of both mitral and aortic valves: Secondary | ICD-10-CM | POA: Insufficient documentation

## 2023-12-24 DIAGNOSIS — E0590 Thyrotoxicosis, unspecified without thyrotoxic crisis or storm: Secondary | ICD-10-CM | POA: Insufficient documentation

## 2023-12-24 DIAGNOSIS — I10 Essential (primary) hypertension: Secondary | ICD-10-CM | POA: Insufficient documentation

## 2023-12-24 DIAGNOSIS — D6869 Other thrombophilia: Secondary | ICD-10-CM | POA: Insufficient documentation

## 2023-12-24 DIAGNOSIS — Z79899 Other long term (current) drug therapy: Secondary | ICD-10-CM | POA: Insufficient documentation

## 2023-12-24 DIAGNOSIS — E119 Type 2 diabetes mellitus without complications: Secondary | ICD-10-CM | POA: Insufficient documentation

## 2023-12-24 DIAGNOSIS — I480 Paroxysmal atrial fibrillation: Secondary | ICD-10-CM | POA: Insufficient documentation

## 2023-12-24 MED ORDER — APIXABAN 5 MG PO TABS
5.0000 mg | ORAL_TABLET | Freq: Two times a day (BID) | ORAL | 6 refills | Status: DC
  Filled 2023-12-24: qty 60, 30d supply, fill #0

## 2023-12-24 MED ORDER — METOPROLOL TARTRATE 25 MG PO TABS
ORAL_TABLET | ORAL | 6 refills | Status: DC
  Filled 2023-12-24: qty 60, fill #0
  Filled 2024-02-09: qty 60, 40d supply, fill #0
  Filled 2024-03-23: qty 60, 40d supply, fill #1
  Filled 2024-05-27: qty 60, 40d supply, fill #2
  Filled 2024-07-05: qty 60, 40d supply, fill #3
  Filled 2024-08-10: qty 45, 30d supply, fill #4
  Filled 2024-09-07: qty 45, 30d supply, fill #5
  Filled 2024-11-29: qty 45, 30d supply, fill #6
  Filled 2024-11-30 (×2): qty 45, 30d supply, fill #0

## 2023-12-24 NOTE — Patient Instructions (Signed)
Metoprolol 1/2 tablet in the morning and 1 tablet in the evening

## 2023-12-24 NOTE — Progress Notes (Signed)
Primary Care Physician: Grayce Sessions, NP Primary Cardiologist: Jodelle Red, MD Electrophysiologist: None  Referring Physician: Dr Thomasena Edis [name] is a 48 y.o. female with a history of DM, hyperthyroidism, HTN, atrial fibrillation who presents for consultation in the [facility]. The patient was initially diagnosed with atrial fibrillation 06/18/23 after presenting to the ED with symptoms of tachypalpitations. ECG showed afib with RVR and she was started on diltiazem which converted her to SR. Patient was also started on Eliquis for a CHADS2VASC score of 3. TSH on admission was <0.01, T3 and T4 at 23.8 and 4.47 respectively. She was started on methimazole. Echo showed normal EF, mild MR, mild AS.   On evaluation today, she is currently in NSR. She has remained compliant with methimazole for thyroid treatment. She has not had any episodes of Afib since hospital visit. She has been taking accidentally 1 whole tablet of metoprolol twice daily instead of half tablet twice daily. No missed Eliquis. She has an Apple watch.   On follow up 12/24/23, she is currently in NSR. She has had no episodes of Afib since last office visit. She has noted some brief palpitations in the evening before bed at times. She remains compliant with treatment for hyperthyroidism. No bleeding issues on Eliquis; patient notes she is likely beginning menopause.   Today, she denies symptoms of palpitations, chest pain, shortness of breath, orthopnea, PND, lower extremity edema, dizziness, presyncope, syncope, snoring, daytime somnolence, bleeding, or neurologic sequela. The patient is tolerating medications without difficulties and is otherwise without complaint today.   Atrial Fibrillation Management history:  Previous antiarrhythmic drugs: none Previous cardioversions: none Previous ablations: none Anticoagulation history: Eliquis  ROS- All systems are  reviewed and negative except as per the HPI above.   Physical Exam: BP 132/60   Pulse 81   Ht 4' 9.5" (1.461 m)   Wt 69.5 kg   LMP 12/04/2023   BMI 32.58 kg/m   GEN- The patient is well appearing, alert and oriented x 3 today.   Neck - no JVD or carotid bruit noted Lungs- Clear to ausculation bilaterally, normal work of breathing Heart- Regular rate and rhythm, no murmurs, rubs or gallops, PMI not laterally displaced Extremities- no clubbing, cyanosis, or edema Skin - no rash or ecchymosis noted   Wt Readings from Last 3 Encounters:  12/24/23 69.5 kg  12/21/23 67.1 kg  10/15/23 68 kg     EKG today demonstrates  Vent. rate 81 BPM PR interval 136 ms QRS duration 74 ms QT/QTcB 354/411 ms P-R-T axes 36 56 16 Normal sinus rhythm Normal ECG When compared with ECG of 15-Jul-2023 15:09, PREVIOUS ECG IS PRESENT  Echo 06/20/23 demonstrated   1. Left ventricular ejection fraction, by estimation, is 60 to 65%. The  left ventricle has normal function. The left ventricle has no regional  wall motion abnormalities. Left ventricular diastolic parameters were  normal.   2. Right ventricular systolic function is normal. The right ventricular  size is normal. There is moderately elevated pulmonary artery systolic  pressure.   3. The mitral valve is normal in structure. Mild mitral valve  regurgitation. No evidence of mitral stenosis.   4. The aortic valve is tricuspid. Aortic valve regurgitation is mild.  Mild aortic valve stenosis. Aortic valve mean gradient measures 10.0 mmHg.  Aortic valve Vmax measures 2.30 m/s.   5. The inferior vena cava is dilated in size with <50% respiratory  variability,  suggesting right atrial pressure of 15 mmHg.   Comparison(s): No prior Echocardiogram.    CHA2DS2-VASc Score = 3  The patient's score is based upon: CHF History: 0 HTN History: 1 Diabetes History: 1 Stroke History: 0 Vascular Disease History: 0 Age Score: 0 Gender Score: 1        ASSESSMENT AND PLAN: Paroxysmal Atrial Fibrillation (ICD10:  I48.0) The patient's CHA2DS2-VASc score is 3, indicating a 3.2% annual risk of stroke.    She is currently in NSR.   Suspect secondary to hyperthyroidism; appears to have low burden of Afib. Continue Eliquis 5 mg BID. Change to Lopressor 12.5 mg AM, 25 mg PM.   Secondary Hypercoagulable State (ICD10:  D68.69) The patient is at significant risk for stroke/thromboembolism based upon her CHA2DS2-VASc Score of 3.  Continue Apixaban (Eliquis).  No missed doses.    HTN Stable on current regimen   Hyperthyroidism On methimazole    Follow up 6 months Afib clinic.    Lake Bells, PA-C [facility] [address] [place], [place] [address] [phone]

## 2023-12-25 ENCOUNTER — Other Ambulatory Visit (INDEPENDENT_AMBULATORY_CARE_PROVIDER_SITE_OTHER): Payer: Self-pay

## 2023-12-25 ENCOUNTER — Other Ambulatory Visit: Payer: Self-pay

## 2023-12-25 LAB — CYTOLOGY - PAP
Adequacy: ABSENT
Comment: NEGATIVE
Diagnosis: NEGATIVE
High risk HPV: NEGATIVE

## 2023-12-25 MED ORDER — FLUCONAZOLE 150 MG PO TABS
150.0000 mg | ORAL_TABLET | Freq: Once | ORAL | 0 refills | Status: AC
  Filled 2023-12-25: qty 1, 1d supply, fill #0

## 2023-12-25 MED ORDER — METRONIDAZOLE 500 MG PO TABS
500.0000 mg | ORAL_TABLET | Freq: Two times a day (BID) | ORAL | 0 refills | Status: AC
  Filled 2023-12-25: qty 14, 7d supply, fill #0

## 2023-12-28 ENCOUNTER — Other Ambulatory Visit: Payer: Self-pay

## 2023-12-29 ENCOUNTER — Other Ambulatory Visit: Payer: Self-pay

## 2024-01-01 ENCOUNTER — Ambulatory Visit (INDEPENDENT_AMBULATORY_CARE_PROVIDER_SITE_OTHER): Payer: Self-pay | Admitting: Primary Care

## 2024-01-01 NOTE — Telephone Encounter (Signed)
  Chief Complaint: toothache  Symptoms: achy for 3 days  Frequency: constant   Disposition: [] ED /[] Urgent Care (no appt availability in office) / [] Appointment(In office/virtual)/ []  [facility]/ [x] Home Care/ [] Refused Recommended Disposition /[] [facility]/ []  Follow-up with PCP Additional Notes: Pt called with interpreter Izette [phone]) complaining of toothache (right lower molar) that has been present for 3 days. Pt does not have a dentist and asked for suggestions. RN offered pt to [employer] nearby offices and call to make an appt. Pt denies fever, but does have some swelling on right side of face she noticed this morning.   RN also told pt if pain becomes intense before seeing a dentist to seek care at an urgent care for pain. RN gave care advice and pt verbalized understanding.        Copied from CRM [id number]. Topic: Clinical - Red Word Triage >> Jan 01, 2024 11:50 AM Marlow Baars wrote: Red Word that prompted transfer to Nurse Triage: The patient called in stating she is having extreme pain with her molar tooth. She said she has been experiencing this pain for a few days and she cannot take it anymore. She would like to speak with a nurse. I am going to transfer her to the E2C2 NT due to Red Word pain Reason for Disposition  Toothache present > 24 hours  Answer Assessment - Initial Assessment Questions 1. LOCATION: "Which tooth is hurting?"  (e.g., right-side/left-side, upper/lower, front/back)     Molar on right lower side  2. ONSET: "When did the toothache start?"  (e.g., hours, days)      3 days 3. SEVERITY: "How bad is the toothache?"  (Scale 1-10; mild, moderate or severe)   - MILD (1-3): doesn't interfere with chewing    - MODERATE (4-7): interferes with chewing, interferes with normal activities, awakens from sleep     - SEVERE (8-10): unable to eat, unable to do any normal activities, excruciating pain        moderate 4. SWELLING: "Is there any visible  swelling of your face?"     Yes, little bit 5. OTHER SYMPTOMS: "Do you have any other symptoms?" (e.g., fever)     denies  Protocols used: Toothache-A-AH

## 2024-01-04 ENCOUNTER — Ambulatory Visit (INDEPENDENT_AMBULATORY_CARE_PROVIDER_SITE_OTHER): Payer: Self-pay | Admitting: Primary Care

## 2024-01-04 ENCOUNTER — Other Ambulatory Visit: Payer: Self-pay

## 2024-01-04 DIAGNOSIS — E0590 Thyrotoxicosis, unspecified without thyrotoxic crisis or storm: Secondary | ICD-10-CM

## 2024-01-04 NOTE — Telephone Encounter (Signed)
Noted

## 2024-01-08 ENCOUNTER — Other Ambulatory Visit: Payer: Self-pay

## 2024-01-12 LAB — TRAB (TSH RECEPTOR BINDING ANTIBODY): TRAB: 18.47 [IU]/L — ABNORMAL HIGH (ref <=2.00)

## 2024-01-12 LAB — TSH: TSH: <0.01 m[IU]/L — ABNORMAL LOW

## 2024-01-12 LAB — THYROID STIMULATING IMMUNOGLOBULIN: TSI: 280 % baseline — ABNORMAL HIGH (ref <140)

## 2024-01-12 LAB — T4, FREE: Free T4: 1.9 ng/dL — ABNORMAL HIGH (ref 0.8–1.8)

## 2024-01-12 LAB — T3, FREE: T3, Free: 8.2 pg/mL — ABNORMAL HIGH (ref 2.3–4.2)

## 2024-01-15 ENCOUNTER — Encounter: Payer: Self-pay | Admitting: "Endocrinology

## 2024-01-15 ENCOUNTER — Ambulatory Visit (INDEPENDENT_AMBULATORY_CARE_PROVIDER_SITE_OTHER): Payer: Self-pay | Admitting: "Endocrinology

## 2024-01-15 ENCOUNTER — Other Ambulatory Visit: Payer: Self-pay

## 2024-01-15 VITALS — BP 118/60 | HR 70 | Resp 20 | Ht <= 58 in | Wt 150.0 lb

## 2024-01-15 DIAGNOSIS — E0500 Thyrotoxicosis with diffuse goiter without thyrotoxic crisis or storm: Secondary | ICD-10-CM

## 2024-01-15 MED ORDER — METHIMAZOLE 10 MG PO TABS
10.0000 mg | ORAL_TABLET | Freq: Three times a day (TID) | ORAL | 1 refills | Status: DC
  Filled 2024-01-15: qty 90, 30d supply, fill #0

## 2024-01-15 NOTE — Progress Notes (Signed)
 Outpatient Endocrinology Note Susan Birmingham, MD  01/15/24   Susan [name] 05-13-1976 [id number]  Referring Provider: Celestia Rosaline SQUIBB, NP Primary Care Provider: Celestia Rosaline SQUIBB, NP Subjective  No chief complaint on file.   Assessment & Plan  Diagnoses and all orders for this visit:  Graves disease -     TSH -     T4, free -     T3, free -     methimazole  (TAPAZOLE ) 10 MG tablet; Take 1 tablet (10 mg total) by mouth 3 (three) times daily.    Susan [name] is currently taking methimazole  10 mg every day. Started MMI in 2024 Patient currently clinically and biochemically hyperthyroid.  Discussed the etiology for hyperthyroidism. Educated on thyroid  axis.  Recommend the following: Take methimazole  10 mg once a day. Repeat labs in 3 months or sooner if symptoms of hyper or hypothyroidism develop.  -compliance and follow up needs    06/2023 thyroid  ultrasound reported 2 thyroid  nodules, none met criteria for f/u or FNA Both nodules 1.1 cm at max diameter and TR3  If you notice any symptoms of worsening fatigue, fever with sore throat, loss of appetite, yellowing of eyes, dark urine, joint pains, sores in the mouth, itchy rash, light colored stools or abdominal pain, please stop the medication and call us  immediately as this can be a serious side effect of the medication.  I have reviewed current medications, nurse's notes, allergies, vital signs, past medical and surgical history, family medical history, and social history for this encounter. Counseled patient on symptoms, examination findings, lab findings, imaging results, treatment decisions and monitoring and prognosis. The patient understood the recommendations and agrees with the treatment plan. All questions regarding treatment plan were fully answered.   Return in about 20 days (around 02/04/2024) for visit + labs 2-3 days before next visit.   Susan Birmingham, MD   01/15/24   I have reviewed current medications, nurse's notes, allergies, vital signs, past medical and surgical history, family medical history, and social history for this encounter. Counseled patient on symptoms, examination findings, lab findings, imaging results, treatment decisions and monitoring and prognosis. The patient understood the recommendations and agrees with the treatment plan. All questions regarding treatment plan were fully answered.   History of Present Illness Susan [name] is a 48 y.o. year old female who presents to our clinic with graves disease diagnosed in 2024?    Been on methimazole  5 mg every day since 06/2023.   Symptoms suggestive of HYPERTHYROIDISM:  weight loss  No heat intolerance No hyperdefecation  No palpitations  Yes, reports one episode   Compressive symptoms:  dysphagia  No dysphonia  No positional dyspnea (especially with simultaneous arms elevation)  Yes, some subjective SOB  Smokes  No On biotin  No Personal history of head/neck surgery/irradiation  No  Adverse Drug Effects from Methimazole  (MMI): rash No fever No throat pain Yes, had cold like symptoms, resolved now  arthritis No mouth ulcers No jaundice No loss of appetite No lymphadenopathy No  06/2023 THYROID  ULTRASOUND   TECHNIQUE: Ultrasound examination of the thyroid  gland and adjacent soft tissues was performed.   COMPARISON:  None Available.   FINDINGS: Parenchymal Echotexture: Moderately heterogenous   Isthmus: 0.9 cm   Right lobe: 3.9 x 3.0 x 2.4 cm   Left lobe: 3.1 x 1.8 x 2.2 cm   _________________________________________________________   Estimated total number of nodules >/= 1 cm: 1   Number  of spongiform nodules >/=  2 cm not described below (TR1): 0   Number of mixed cystic and solid nodules >/= 1.5 cm not described below (TR2): 0   _________________________________________________________   Diffusely heterogeneous and enlarged  thyroid  gland. Vascularity is within normal limits on color Doppler imaging. No significant hypervascularity.   Nodule # 1: Ill-defined solid hypoechoic nodule within the right lower gland measures 1.1 x 0.9 x 1.2 cm. Findings are consistent with TI-RADS category 3. Given size (<1.4 cm) and appearance, this nodule does NOT meet TI-RADS criteria for biopsy or dedicated follow-up.   Nodule # 2: Small hyperechoic nodule in the left lower gland measures 1.1 x 0.9 x 0.9 cm. Margins are ill-defined. No suspicious features. Findings are consistent with TI-RADS category 3. Given size (<1.4 cm) and appearance, this nodule does NOT meet TI-RADS criteria for biopsy or dedicated follow-up.   IMPRESSION: 1. Diffusely enlarged and heterogeneous thyroid  gland. Findings suggest either chronic thyroiditis or diffuse goitrous change. 2. Two small nodules are noted incidentally, 1 in the right inferior gland in 1 in the left inferior gland. Neither nodule meets criteria to warrant biopsy or dedicated imaging follow-up. No follow-up recommended.  Physical Exam  BP 118/60 (BP Location: Left Arm, Patient Position: Sitting, Cuff Size: Large)   Pulse 70   Resp 20   Ht 4' 9.5 (1.461 m)   Wt 150 lb (68 kg)   LMP 12/04/2023   SpO2 96%   BMI 31.90 kg/m  Constitutional: well developed, well nourished Head: normocephalic, atraumatic, no exophthalmos Eyes: sclera anicteric, no redness Neck: no thyromegaly, no thyroid  tenderness Lungs: normal respiratory effort Neurology: alert and oriented, no fine hand tremor Skin: dry, no appreciable rashes Musculoskeletal: no appreciable defects Psychiatric: normal mood and affect  Allergies No Known Allergies  Current Medications Patient's Medications  New Prescriptions   No medications on file  Previous Medications   APIXABAN  (ELIQUIS ) 5 MG TABS TABLET    Take 1 tablet (5 mg total) by mouth 2 (two) times daily.   ATORVASTATIN  (LIPITOR) 20 MG TABLET     Take 1 tablet (20 mg total) by mouth daily.   ERYTHROMYCIN  OPHTHALMIC OINTMENT    Place a 1/2 inch ribbon of ointment into the lower eyelid 4 times daily for 7 days.   IBUPROFEN  (ADVIL ) 400 MG TABLET    Take 1 tablet (400 mg total) by mouth every 8 (eight) hours as needed for moderate pain (pain score 4-6).   LISINOPRIL  (ZESTRIL ) 5 MG TABLET    Take 1 tablet (5 mg total) by mouth daily.   METFORMIN  (GLUCOPHAGE -XR) 500 MG 24 HR TABLET    Take 1 tablet (500 mg total) by mouth 2 (two) times daily.   METOPROLOL  TARTRATE (LOPRESSOR ) 25 MG TABLET    Take 0.5 tablets (12.5 mg total) by mouth in the morning AND 1 tablet (25 mg total) every evening.  Modified Medications   Modified Medication Previous Medication   METHIMAZOLE  (TAPAZOLE ) 10 MG TABLET methimazole  (TAPAZOLE ) 10 MG tablet      Take 1 tablet (10 mg total) by mouth 3 (three) times daily.    Take 1 tablet (10 mg total) by mouth daily.  Discontinued Medications   No medications on file    Past Medical History Past Medical History:  Diagnosis Date   Atrial fibrillation (HCC)    Diabetes 1.5, managed as type 2 (HCC)    DM (diabetes mellitus) (HCC)    Hypertension    Hyperthyroidism  Past Surgical History History reviewed. No pertinent surgical history.  Family History family history includes Diabetes in her father, mother, sister, and sister; Hypertension in her father.  Social History Social History   Socioeconomic History   Marital status: Single    Spouse name: Not on file   Number of children: 4   Years of education: Not on file   Highest education level: 5th grade  Occupational History   Not on file  Tobacco Use   Smoking status: Never   Smokeless tobacco: Never  Vaping Use   Vaping status: Never Used  Substance and Sexual Activity   Alcohol use: No   Drug use: No   Sexual activity: Not Currently    Birth control/protection: Condom  Other Topics Concern   Not on file  Social History Narrative   Not on file    Social Drivers of Health   Financial Resource Strain: Medium Risk (09/18/2023)   Overall Financial Resource Strain (CARDIA)    Difficulty of Paying Living Expenses: Somewhat hard  Food Insecurity: Food Insecurity Present (09/18/2023)   Hunger Vital Sign    Worried About Running Out of Food in the Last Year: Sometimes true    Ran Out of Food in the Last Year: Sometimes true  Transportation Needs: Unmet Transportation Needs (09/18/2023)   PRAPARE - Administrator, Civil Service (Medical): Yes    Lack of Transportation (Non-Medical): Yes  Physical Activity: Not on file  Stress: Not on file  Social Connections: Not on file  Intimate Partner Violence: Not on file    Laboratory Investigations Lab Results  Component Value Date   TSH <0.01 (L) 01/08/2024   TSH <0.01 Repeated and verified X2. (L) 10/09/2023   TSH <0.005 (L) 09/24/2023   FREET4 1.9 (H) 01/08/2024   FREET4 0.99 10/09/2023   FREET4 1.32 09/24/2023     Lab Results  Component Value Date   TSI 280 (H) 01/08/2024     No components found for: TRAB   Lab Results  Component Value Date   CHOL 113 09/24/2023   Lab Results  Component Value Date   HDL 41 09/24/2023   Lab Results  Component Value Date   LDLCALC 55 09/24/2023   Lab Results  Component Value Date   TRIG 83 09/24/2023   Lab Results  Component Value Date   CHOLHDL 2.8 09/24/2023   Lab Results  Component Value Date   CREATININE 0.57 12/21/2023   No results found for: GFR    Component Value Date/Time   NA 139 12/21/2023 1035   K 4.8 12/21/2023 1035   CL 104 12/21/2023 1035   CO2 19 (L) 12/21/2023 1035   GLUCOSE 116 (H) 12/21/2023 1035   GLUCOSE 144 (H) 06/20/2023 0848   BUN 11 12/21/2023 1035   CREATININE 0.57 12/21/2023 1035   CALCIUM  9.6 12/21/2023 1035   PROT 7.7 12/21/2023 1035   ALBUMIN 4.4 12/21/2023 1035   AST 27 12/21/2023 1035   ALT 37 (H) 12/21/2023 1035   ALKPHOS 120 12/21/2023 1035   BILITOT 0.3 12/21/2023  1035   GFRNONAA >60 06/20/2023 0848   GFRAA 115 10/16/2020 1017      Latest Ref Rng & Units 12/21/2023   10:35 AM 06/20/2023    8:48 AM 06/19/2023    3:58 AM  BMP  Glucose 70 - 99 mg/dL 883  855  865   BUN 6 - 24 mg/dL 11  13  15    Creatinine 0.57 - 1.00 mg/dL  0.57  0.35  0.44   BUN/Creat Ratio 9 - 23 19     Sodium 134 - 144 mmol/L 139  137  138   Potassium 3.5 - 5.2 mmol/L 4.8  3.6  3.7   Chloride 96 - 106 mmol/L 104  113  112   CO2 20 - 29 mmol/L 19  18  19    Calcium  8.7 - 10.2 mg/dL 9.6  9.0  9.0        Component Value Date/Time   WBC 8.9 12/21/2023 1035   WBC 7.5 06/20/2023 0848   RBC 5.35 (H) 12/21/2023 1035   RBC 3.93 06/20/2023 0848   HGB 11.2 12/21/2023 1035   HCT 37.6 12/21/2023 1035   PLT 269 12/21/2023 1035   MCV 70 (L) 12/21/2023 1035   MCH 20.9 (L) 12/21/2023 1035   MCH 26.7 06/20/2023 0848   MCHC 29.8 (L) 12/21/2023 1035   MCHC 31.9 06/20/2023 0848   RDW 16.6 (H) 12/21/2023 1035   LYMPHSABS 3.4 (H) 12/21/2023 1035   MONOABS 0.8 06/06/2012 2030   EOSABS 0.0 12/21/2023 1035   BASOSABS 0.0 12/21/2023 1035      Parts of this note may have been dictated using voice recognition software. There may be variances in spelling and vocabulary which are unintentional. Not all errors are proofread. Please notify the dino if any discrepancies are noted or if the meaning of any statement is not clear.

## 2024-02-01 ENCOUNTER — Other Ambulatory Visit: Payer: Self-pay

## 2024-02-04 ENCOUNTER — Ambulatory Visit: Payer: Self-pay | Admitting: "Endocrinology

## 2024-02-09 ENCOUNTER — Other Ambulatory Visit: Payer: Self-pay

## 2024-02-10 ENCOUNTER — Other Ambulatory Visit: Payer: Self-pay

## 2024-02-11 ENCOUNTER — Other Ambulatory Visit: Payer: Self-pay

## 2024-02-22 ENCOUNTER — Other Ambulatory Visit: Payer: Self-pay

## 2024-02-23 LAB — TSH: TSH: <0.01 m[IU]/L — ABNORMAL LOW

## 2024-02-23 LAB — T4, FREE: Free T4: 1.1 ng/dL (ref 0.8–1.8)

## 2024-02-23 LAB — T3, FREE: T3, Free: 3.7 pg/mL (ref 2.3–4.2)

## 2024-02-29 ENCOUNTER — Encounter: Payer: Self-pay | Admitting: "Endocrinology

## 2024-02-29 ENCOUNTER — Ambulatory Visit (INDEPENDENT_AMBULATORY_CARE_PROVIDER_SITE_OTHER): Payer: Self-pay | Admitting: "Endocrinology

## 2024-02-29 ENCOUNTER — Other Ambulatory Visit: Payer: Self-pay

## 2024-02-29 VITALS — BP 120/82 | HR 75 | Ht <= 58 in | Wt 157.0 lb

## 2024-02-29 DIAGNOSIS — E042 Nontoxic multinodular goiter: Secondary | ICD-10-CM

## 2024-02-29 DIAGNOSIS — E0500 Thyrotoxicosis with diffuse goiter without thyrotoxic crisis or storm: Secondary | ICD-10-CM

## 2024-02-29 MED ORDER — METHIMAZOLE 10 MG PO TABS
10.0000 mg | ORAL_TABLET | Freq: Two times a day (BID) | ORAL | 1 refills | Status: DC
  Filled 2024-02-29 – 2024-03-23 (×2): qty 90, 45d supply, fill #0

## 2024-02-29 NOTE — Progress Notes (Signed)
 Outpatient Endocrinology Note Susan [place], MD  02/29/24   Susan [name] June 12, 1976 [id number]  Referring Provider: Grayce Sessions, NP Primary Care Provider: Grayce Sessions, NP Subjective  No chief complaint on file.   Assessment & Plan  Diagnoses and all orders for this visit:  Graves disease -     TSH -     T4, free -     T3, free -     methimazole (TAPAZOLE) 10 MG tablet; Take 1 tablet (10 mg total) by mouth 2 (two) times daily.     Susan [name] is currently taking methimazole 10 mg thrice a day. Started MMI in 2024 Patient currently clinically and biochemically hyperthyroid.  Discussed the etiology for hyperthyroidism. Educated on thyroid axis.  Recommend the following: Take methimazole 10 mg twice a day. Repeat labs in 3 months or sooner if symptoms of hyper or hypothyroidism develop.  -compliance and follow up needs    06/2023 thyroid ultrasound reported 2 thyroid nodules, none met criteria for f/u or FNA Both nodules 1.1 cm at max diameter and TR3  If you notice any symptoms of worsening fatigue, fever with sore throat, loss of appetite, yellowing of eyes, dark urine, joint pains, sores in the mouth, itchy rash, light colored stools or abdominal pain, please stop the medication and call us immediately as this can be a serious side effect of the medication.  I have reviewed current medications, nurse's notes, allergies, vital signs, past medical and surgical history, family medical history, and social history for this encounter. Counseled patient on symptoms, examination findings, lab findings, imaging results, treatment decisions and monitoring and prognosis. The patient understood the recommendations and agrees with the treatment plan. All questions regarding treatment plan were fully answered.   Return in about 6 weeks (around 04/11/2024) for visit + labs before next visit.   Susan [place], MD  02/29/24   I  have reviewed current medications, nurse's notes, allergies, vital signs, past medical and surgical history, family medical history, and social history for this encounter. Counseled patient on symptoms, examination findings, lab findings, imaging results, treatment decisions and monitoring and prognosis. The patient understood the recommendations and agrees with the treatment plan. All questions regarding treatment plan were fully answered.   History of Present Illness Susan [name] is a 48 y.o. year old female who presents to our clinic with graves disease diagnosed in 2024?    Been on methimazole 5 mg every day since 06/2023.  Currently on methimazole 10 mg tid  C/o weight gain, nausea, decreased appetite sometimes   Symptoms suggestive of HYPERTHYROIDISM:  weight loss  No heat intolerance No hyperdefecation  No palpitations  Yes, reports one episode   Compressive symptoms:  dysphagia  No dysphonia  No positional dyspnea (especially with simultaneous arms elevation)  Yes, some subjective SOB  Smokes  No On biotin  No Personal history of head/neck surgery/irradiation  No  Adverse Drug Effects from Methimazole (MMI): rash No fever No throat pain Yes, had cold like symptoms, resolved now  arthritis No mouth ulcers No jaundice No loss of appetite No lymphadenopathy No  06/2023 THYROID ULTRASOUND   TECHNIQUE: Ultrasound examination of the thyroid gland and adjacent soft tissues was performed.   COMPARISON:  None Available.   FINDINGS: Parenchymal Echotexture: Moderately heterogenous   Isthmus: 0.9 cm   Right lobe: 3.9 x 3.0 x 2.4 cm   Left lobe: 3.1 x 1.8 x 2.2 cm  _________________________________________________________   Estimated total number of nodules >/= 1 cm: 1   Number of spongiform nodules >/=  2 cm not described below (TR1): 0   Number of mixed cystic and solid nodules >/= 1.5 cm not described below (TR2): 0    _________________________________________________________   Diffusely heterogeneous and enlarged thyroid gland. Vascularity is within normal limits on color Doppler imaging. No significant hypervascularity.   Nodule # 1: Ill-defined solid hypoechoic nodule within the right lower gland measures 1.1 x 0.9 x 1.2 cm. Findings are consistent with TI-RADS category 3. Given size (<1.4 cm) and appearance, this nodule does NOT meet TI-RADS criteria for biopsy or dedicated follow-up.   Nodule # 2: Small hyperechoic nodule in the left lower gland measures 1.1 x 0.9 x 0.9 cm. Margins are ill-defined. No suspicious features. Findings are consistent with TI-RADS category 3. Given size (<1.4 cm) and appearance, this nodule does NOT meet TI-RADS criteria for biopsy or dedicated follow-up.   IMPRESSION: 1. Diffusely enlarged and heterogeneous thyroid gland. Findings suggest either chronic thyroiditis or diffuse goitrous change. 2. Two small nodules are noted incidentally, 1 in the right inferior gland in 1 in the left inferior gland. Neither nodule meets criteria to warrant biopsy or dedicated imaging follow-up. No follow-up recommended.  Physical Exam  BP 120/82   Pulse 75   Ht 4' 9.5" (1.461 m)   Wt 157 lb (71.2 kg)   SpO2 98%   BMI 33.39 kg/m  Constitutional: well developed, well nourished Head: normocephalic, atraumatic, no exophthalmos Eyes: sclera anicteric, no redness Neck: no thyromegaly, no thyroid tenderness Lungs: normal respiratory effort Neurology: alert and oriented, no fine hand tremor Skin: dry, no appreciable rashes Musculoskeletal: no appreciable defects Psychiatric: normal mood and affect  Allergies No Known Allergies  Current Medications Patient's Medications  New Prescriptions   No medications on file  Previous Medications   APIXABAN (ELIQUIS) 5 MG TABS TABLET    Take 1 tablet (5 mg total) by mouth 2 (two) times daily.   ATORVASTATIN (LIPITOR) 20 MG TABLET     Take 1 tablet (20 mg total) by mouth daily.   ERYTHROMYCIN OPHTHALMIC OINTMENT    Place a 1/2 inch ribbon of ointment into the lower eyelid 4 times daily for 7 days.   IBUPROFEN (ADVIL) 400 MG TABLET    Take 1 tablet (400 mg total) by mouth every 8 (eight) hours as needed for moderate pain (pain score 4-6).   LISINOPRIL (ZESTRIL) 5 MG TABLET    Take 1 tablet (5 mg total) by mouth daily.   METFORMIN (GLUCOPHAGE-XR) 500 MG 24 HR TABLET    Take 1 tablet (500 mg total) by mouth 2 (two) times daily.   METOPROLOL TARTRATE (LOPRESSOR) 25 MG TABLET    Take 0.5 tablets (12.5 mg total) by mouth in the morning AND 1 tablet (25 mg total) every evening.  Modified Medications   Modified Medication Previous Medication   METHIMAZOLE (TAPAZOLE) 10 MG TABLET methimazole (TAPAZOLE) 10 MG tablet      Take 1 tablet (10 mg total) by mouth 2 (two) times daily.    Take 1 tablet (10 mg total) by mouth 3 (three) times daily.  Discontinued Medications   No medications on file    Past Medical History Past Medical History:  Diagnosis Date   Atrial fibrillation (HCC)    Diabetes 1.5, managed as type 2 (HCC)    DM (diabetes mellitus) (HCC)    Hypertension    Hyperthyroidism  Past Surgical History History reviewed. No pertinent surgical history.  Family History family history includes Diabetes in her father, mother, sister, and sister; Hypertension in her father.  Social History Social History   Socioeconomic History   Marital status: Single    Spouse name: Not on file   Number of children: 4   Years of education: Not on file   Highest education level: 5th grade  Occupational History   Not on file  Tobacco Use   Smoking status: Never   Smokeless tobacco: Never  Vaping Use   Vaping status: Never Used  Substance and Sexual Activity   Alcohol use: No   Drug use: No   Sexual activity: Not Currently    Birth control/protection: Condom  Other Topics Concern   Not on file  Social History Narrative    Not on file   Social Drivers of Health   Financial Resource Strain: Medium Risk (09/18/2023)   Overall Financial Resource Strain (CARDIA)    Difficulty of Paying Living Expenses: Somewhat hard  Food Insecurity: Food Insecurity Present (09/18/2023)   Hunger Vital Sign    Worried About Running Out of Food in the Last Year: Sometimes true    Ran Out of Food in the Last Year: Sometimes true  Transportation Needs: Unmet Transportation Needs (09/18/2023)   PRAPARE - Administrator, Civil Service (Medical): Yes    Lack of Transportation (Non-Medical): Yes  Physical Activity: Not on file  Stress: Not on file  Social Connections: Not on file  Intimate Partner Violence: Not on file    Laboratory Investigations Lab Results  Component Value Date   TSH <0.01 (L) 02/22/2024   TSH <0.01 (L) 01/08/2024   TSH <0.01 Repeated and verified X2. (L) 10/09/2023   FREET4 1.1 02/22/2024   FREET4 1.9 (H) 01/08/2024   FREET4 0.99 10/09/2023     Lab Results  Component Value Date   TSI 280 (H) 01/08/2024     No components found for: "TRAB"   Lab Results  Component Value Date   CHOL 113 09/24/2023   Lab Results  Component Value Date   HDL 41 09/24/2023   Lab Results  Component Value Date   LDLCALC 55 09/24/2023   Lab Results  Component Value Date   TRIG 83 09/24/2023   Lab Results  Component Value Date   CHOLHDL 2.8 09/24/2023   Lab Results  Component Value Date   CREATININE 0.57 12/21/2023   No results found for: "GFR"    Component Value Date/Time   NA 139 12/21/2023 1035   K 4.8 12/21/2023 1035   CL 104 12/21/2023 1035   CO2 19 (L) 12/21/2023 1035   GLUCOSE 116 (H) 12/21/2023 1035   GLUCOSE 144 (H) 06/20/2023 0848   BUN 11 12/21/2023 1035   CREATININE 0.57 12/21/2023 1035   CALCIUM 9.6 12/21/2023 1035   PROT 7.7 12/21/2023 1035   ALBUMIN 4.4 12/21/2023 1035   AST 27 12/21/2023 1035   ALT 37 (H) 12/21/2023 1035   ALKPHOS 120 12/21/2023 1035   BILITOT 0.3  12/21/2023 1035   GFRNONAA >60 06/20/2023 0848   GFRAA 115 10/16/2020 1017      Latest Ref Rng & Units 12/21/2023   10:35 AM 06/20/2023    8:48 AM 06/19/2023    3:58 AM  BMP  Glucose 70 - 99 mg/dL 161  096  045   BUN 6 - 24 mg/dL 11  13  15    Creatinine 0.57 - 1.00 mg/dL  0.57  0.35  0.44   BUN/Creat Ratio 9 - 23 19     Sodium 134 - 144 mmol/L 139  137  138   Potassium 3.5 - 5.2 mmol/L 4.8  3.6  3.7   Chloride 96 - 106 mmol/L 104  113  112   CO2 20 - 29 mmol/L 19  18  19    Calcium 8.7 - 10.2 mg/dL 9.6  9.0  9.0        Component Value Date/Time   WBC 8.9 12/21/2023 1035   WBC 7.5 06/20/2023 0848   RBC 5.35 (H) 12/21/2023 1035   RBC 3.93 06/20/2023 0848   HGB 11.2 12/21/2023 1035   HCT 37.6 12/21/2023 1035   PLT 269 12/21/2023 1035   MCV 70 (L) 12/21/2023 1035   MCH 20.9 (L) 12/21/2023 1035   MCH 26.7 06/20/2023 0848   MCHC 29.8 (L) 12/21/2023 1035   MCHC 31.9 06/20/2023 0848   RDW 16.6 (H) 12/21/2023 1035   LYMPHSABS 3.4 (H) 12/21/2023 1035   MONOABS 0.8 06/06/2012 2030   EOSABS 0.0 12/21/2023 1035   BASOSABS 0.0 12/21/2023 1035      Parts of this note may have been dictated using voice recognition software. There may be variances in spelling and vocabulary which are unintentional. Not all errors are proofread. Please notify the There[facility] Parkin if any discrepancies are noted or if the meaning of any statement is not clear.

## 2024-03-10 ENCOUNTER — Other Ambulatory Visit: Payer: Self-pay

## 2024-03-20 ENCOUNTER — Other Ambulatory Visit: Payer: Self-pay

## 2024-03-20 ENCOUNTER — Emergency Department (HOSPITAL_COMMUNITY): Payer: Self-pay

## 2024-03-20 ENCOUNTER — Encounter (HOSPITAL_COMMUNITY): Payer: Self-pay

## 2024-03-20 ENCOUNTER — Emergency Department (HOSPITAL_COMMUNITY)
Admission: EM | Admit: 2024-03-20 | Discharge: 2024-03-20 | Disposition: A | Discharge status: Home / Self Care | Payer: Self-pay | Attending: Emergency Medicine | Admitting: Emergency Medicine

## 2024-03-20 DIAGNOSIS — Z7984 Long term (current) use of oral hypoglycemic drugs: Secondary | ICD-10-CM | POA: Insufficient documentation

## 2024-03-20 DIAGNOSIS — R071 Chest pain on breathing: Secondary | ICD-10-CM

## 2024-03-20 DIAGNOSIS — R079 Chest pain, unspecified: Secondary | ICD-10-CM | POA: Insufficient documentation

## 2024-03-20 DIAGNOSIS — D649 Anemia, unspecified: Secondary | ICD-10-CM | POA: Insufficient documentation

## 2024-03-20 DIAGNOSIS — E119 Type 2 diabetes mellitus without complications: Secondary | ICD-10-CM | POA: Insufficient documentation

## 2024-03-20 DIAGNOSIS — E039 Hypothyroidism, unspecified: Secondary | ICD-10-CM | POA: Insufficient documentation

## 2024-03-20 DIAGNOSIS — Z7901 Long term (current) use of anticoagulants: Secondary | ICD-10-CM | POA: Insufficient documentation

## 2024-03-20 LAB — CBC
HCT: 30.6 % — ABNORMAL LOW (ref 36.0–46.0)
Hemoglobin: 8.9 g/dL — ABNORMAL LOW (ref 12.0–15.0)
MCH: 21.9 pg — ABNORMAL LOW (ref 26.0–34.0)
MCHC: 29.1 g/dL — ABNORMAL LOW (ref 30.0–36.0)
MCV: 75.2 fL — ABNORMAL LOW (ref 80.0–100.0)
Platelets: 252 10*3/uL (ref 150–400)
RBC: 4.07 MIL/uL (ref 3.87–5.11)
RDW: 14.9 % (ref 11.5–15.5)
WBC: 7.9 10*3/uL (ref 4.0–10.5)
nRBC: 0 % (ref 0.0–0.2)

## 2024-03-20 LAB — BASIC METABOLIC PANEL WITH GFR
Anion gap: 7 (ref 5–15)
BUN: 12 mg/dL (ref 6–20)
CO2: 23 mmol/L (ref 22–32)
Calcium: 8.7 mg/dL — ABNORMAL LOW (ref 8.9–10.3)
Chloride: 105 mmol/L (ref 98–111)
Creatinine, Ser: 0.92 mg/dL (ref 0.44–1.00)
GFR, Estimated: >60 mL/min (ref >60)
Glucose, Bld: 160 mg/dL — ABNORMAL HIGH (ref 70–99)
Potassium: 3.9 mmol/L (ref 3.5–5.1)
Sodium: 135 mmol/L (ref 135–145)

## 2024-03-20 LAB — TROPONIN I (HIGH SENSITIVITY): Troponin I (High Sensitivity): 2 ng/L (ref <18)

## 2024-03-20 LAB — HCG, SERUM, QUALITATIVE: Preg, Serum: NEGATIVE

## 2024-03-20 MED ORDER — OXYCODONE-ACETAMINOPHEN 5-325 MG PO TABS
1.0000 | ORAL_TABLET | Freq: Three times a day (TID) | ORAL | 0 refills | Status: AC | PRN
  Filled 2024-03-20 – 2024-03-23 (×2): qty 9, 3d supply, fill #0

## 2024-03-20 MED ORDER — LIDOCAINE 5 % EX PTCH
1.0000 | MEDICATED_PATCH | CUTANEOUS | Status: DC
  Administered 2024-03-20: 1 via TRANSDERMAL
  Filled 2024-03-20: qty 1

## 2024-03-20 NOTE — ED Triage Notes (Signed)
 Pt reports with left chest pressure/pain that goes into her back x 3 days.

## 2024-03-20 NOTE — ED Provider Notes (Signed)
 [place] [facility] AT [facility] Provider Note   CSN: [id number] Arrival date & time: 03/20/24  1349     History  Chief Complaint  Patient presents with   Chest Pain    Susan [name] is a 48 y.o. female with a history of atrial fibrillation with RVR, diabetes mellitus, and hypothyroidism who presents the ED today for chest pressure.  Patient reports intermittent left-sided chest pressure with palpitations that radiates to her back for the past 3 days.  Pain is worse with deep inspirations and with movement.  Has not been take anything for symptoms at home.  Denies any associated shortness of breath.  Additionally, she endorses generalized weakness for the past several days as well.  Denies dizziness or lightheadedness.  No nausea, vomiting, or diarrhea.  Denies any dark or bloody stools.  She is on Eliquis for her A-fib.  States that she is also on her menstrual cycle.  Was advised that her periods will be more heavily while on Eliquis.  No recent hospitalizations or surgeries.  No swelling or pain in the lower extremities.  Denies any additional complaints or concerns at this time.    Home Medications Prior to Admission medications   Medication Sig Start Date End Date Taking? Authorizing Provider  oxyCODONE-acetaminophen (PERCOCET/ROXICET) 5-325 MG tablet Take 1 tablet by mouth every 8 (eight) hours as needed for up to 3 days for severe pain (pain score 7-10). 03/20/24 03/23/24 Yes Sonnie Dusky, PA-C  apixaban (ELIQUIS) 5 MG TABS tablet Take 1 tablet (5 mg total) by mouth 2 (two) times daily. 12/24/23   Nathanel Bal, PA-C  atorvastatin (LIPITOR) 20 MG tablet Take 1 tablet (20 mg total) by mouth daily. 10/01/23   Marius Siemens, NP  erythromycin ophthalmic ointment Place a 1/2 inch ribbon of ointment into the lower eyelid 4 times daily for 7 days. 11/06/23   Dodson Freestone, FNP  ibuprofen (ADVIL) 400 MG tablet Take 1 tablet (400 mg total) by  mouth every 8 (eight) hours as needed for moderate pain (pain score 4-6). Patient taking differently: Take 400 mg by mouth as needed for moderate pain (pain score 4-6). 12/21/23   Marius Siemens, NP  lisinopril (ZESTRIL) 5 MG tablet Take 1 tablet (5 mg total) by mouth daily. 12/21/23   Marius Siemens, NP  metFORMIN (GLUCOPHAGE-XR) 500 MG 24 hr tablet Take 1 tablet (500 mg total) by mouth 2 (two) times daily. 12/21/23   Marius Siemens, NP  methimazole (TAPAZOLE) 10 MG tablet Take 1 tablet (10 mg total) by mouth 2 (two) times daily. 02/29/24   Motwani, Komal, MD  metoprolol tartrate (LOPRESSOR) 25 MG tablet Take 0.5 tablets (12.5 mg total) by mouth in the morning AND 1 tablet (25 mg total) every evening. 12/24/23   Nathanel Bal, PA-C      Allergies    Patient has no known allergies.    Review of Systems   Review of Systems  Cardiovascular:  Positive for chest pain.  All other systems reviewed and are negative.   Physical Exam Updated Vital Signs BP 129/72 (BP Location: Right Arm)   Pulse 84   Temp 98.6 F (37 C) (Oral)   Resp 18   SpO2 97%  Physical Exam Vitals and nursing note reviewed.  Constitutional:      General: She is not in acute distress.    Appearance: Normal appearance.  HENT:     Head: Normocephalic and atraumatic.  Mouth/Throat:     Mouth: Mucous membranes are moist.  Eyes:     Conjunctiva/sclera: Conjunctivae normal.     Pupils: Pupils are equal, round, and reactive to light.  Cardiovascular:     Rate and Rhythm: Normal rate and regular rhythm.     Pulses: Normal pulses.     Heart sounds: Normal heart sounds.  Pulmonary:     Effort: Pulmonary effort is normal.     Breath sounds: Normal breath sounds.  Abdominal:     Palpations: Abdomen is soft.     Tenderness: There is no abdominal tenderness.  Musculoskeletal:        General: Normal range of motion.     Cervical back: No tenderness.  Skin:    General: Skin is warm and dry.      Findings: No rash.  Neurological:     General: No focal deficit present.     Mental Status: She is alert.     Sensory: No sensory deficit.     Motor: No weakness.  Psychiatric:        Mood and Affect: Mood normal.        Behavior: Behavior normal.    ED Results / Procedures / Treatments   Labs (all labs ordered are listed, but only abnormal results are displayed) Labs Reviewed  BASIC METABOLIC PANEL WITH GFR - Abnormal; Notable for the following components:      Result Value   Glucose, Bld 160 (*)    Calcium 8.7 (*)    All other components within normal limits  CBC - Abnormal; Notable for the following components:   Hemoglobin 8.9 (*)    HCT 30.6 (*)    MCV 75.2 (*)    MCH 21.9 (*)    MCHC 29.1 (*)    All other components within normal limits  HCG, SERUM, QUALITATIVE  TROPONIN I (HIGH SENSITIVITY)    EKG EKG Interpretation Date/Time:  Sunday March 20 2024 14:16:15 EDT Ventricular Rate:  81 PR Interval:  131 QRS Duration:  83 QT Interval:  345 QTC Calculation: 401 R Axis:   35  Text Interpretation: Sinus rhythm Borderline T abnormalities, inferior leads no change Confirmed by Florentino Hurdle [phone]) on 03/20/2024 6:04:52 PM  Radiology DG Chest 2 View Result Date: 03/20/2024 CLINICAL DATA:  Chest pain, left chest pressure EXAM: CHEST - 2 VIEW COMPARISON:  June 19, 2023 FINDINGS: Low lung volumes. No focal airspace consolidation, pleural effusion, or pneumothorax. No cardiomegaly. No acute fracture or destructive lesion. Scattered thoracic osteophytosis. IMPRESSION: Low lung volumes.  No acute cardiopulmonary abnormality. Electronically Signed   By: Rance Burrows M.D.   On: 03/20/2024 14:30    Procedures Procedures: not indicated.   Medications Ordered in ED Medications  lidocaine (LIDODERM) 5 % 1 patch (1 patch Transdermal Patch Applied 03/20/24 1728)    ED Course/ Medical Decision Making/ A&P                                 Medical Decision Making Amount  and/or Complexity of Data Reviewed Labs: ordered. Radiology: ordered.  Risk Prescription drug management.   This patient presents to the ED for concern of chest pains and palpitations, this involves an extensive number of treatment options, and is a complaint that carries with it a high risk of complications and morbidity.   Differential diagnosis includes: ACS, pericarditis, pneumonia, AKI, dehydration, electrolyte abnormality, atrial fibrillation, etc.   Comorbidities  See HPI above   Additional History  Additional history obtained from prior records   Cardiac Monitoring / EKG  The patient was maintained on a cardiac monitor.  I personally viewed and interpreted the cardiac monitored which showed: sinus rhythm with a heart rate of 81 bpm.   Lab Tests  I ordered and personally interpreted labs.  The pertinent results include:   Negative troponin BMP is reassuring Hemoglobin of 8.9 otherwise CBC within normal limits Negative pregnancy test   Imaging Studies  I ordered imaging studies including CXR  I independently visualized and interpreted imaging which showed:  No acute cardiopulmonary disease I agree with the radiologist interpretation   Problem List / ED Course / Critical Interventions / Medication Management  Patient reports intermittent chest discomfort and palpitations that radiates to the back for the past 3 days.  States the pain is worse with movement and with deep inspiration.  Has not been taking any for pain at home.  She is on Eliquis for atrial fibrillation.  No recent skip doses.  She says she has mild discomfort at this time without palpitations.  Denies any shortness of breath.  She is not tachycardic.  Low suspicion for pulmonary embolism. Also endorses generalized weakness.  She is on her menstrual cycle and her hemoglobin is 8.9.  She was advised that her periods will be heavier due to taking the Eliquis.  Last hemoglobin was 10.  Patient  instructed to follow-up with her primary care in the outpatient setting for reevaluation of her hemoglobin level. Patient staffed with my attending, Dr. Carylon Claude I ordered medications including: Lidocaine patch for discomfort  Reevaluation of the patient after these medicines showed that the patient improved I have reviewed the patients home medicines and have made adjustments as needed   Social Determinants of Health  Language barrier   Test / Admission - Considered  Patient is stable and safe for discharge home. Advise close primary care follow-up for reevaluation of hemoglobin. Return precautions given.       Final Clinical Impression(s) / ED Diagnoses Final diagnoses:  Chest pain on breathing  Anemia, unspecified type    Rx / DC Orders ED Discharge Orders          Ordered    oxyCODONE-acetaminophen (PERCOCET/ROXICET) 5-325 MG tablet  Every 8 hours PRN        03/20/24 1818              Sonnie Dusky, PA-C 03/20/24 1830    Horton, Sidra Dredge, DO 03/20/24 2217

## 2024-03-20 NOTE — Discharge Instructions (Addendum)
 Como ya se mencion, sus enzimas cardacas son negativas. No hay signos de lesin cardaca, como un infarto. Su hemoglobina es ms baja de lo normal; esto podra deberse a su ciclo menstrual. Acuda a una consulta con su mdico de cabecera en los prximos das para una nueva evaluacin. Puede obtener parches de lidocana sin receta para un mayor alivio del dolor. De lo contrario, tome Tylenol cada 6-8 horas segn sea necesario para el dolor y Percocet cada 8 horas segn sea necesario para Chief Technology Officer.  Solicite ayuda de inmediato si: Le falta el aire. Siente dolor en la espalda, el abdomen o el pecho. Se siente mareado o sufre un desmayo. Tiene dificultad para concentrarse. Sus heces tienen [place], son Marchelle Sessions o son alquitranadas. Vomita repetidamente o vomita sangre.

## 2024-03-21 ENCOUNTER — Other Ambulatory Visit (HOSPITAL_COMMUNITY): Payer: Self-pay

## 2024-03-21 ENCOUNTER — Ambulatory Visit (INDEPENDENT_AMBULATORY_CARE_PROVIDER_SITE_OTHER): Payer: Self-pay | Admitting: Primary Care

## 2024-03-23 ENCOUNTER — Other Ambulatory Visit (HOSPITAL_COMMUNITY): Payer: Self-pay

## 2024-03-23 ENCOUNTER — Other Ambulatory Visit: Payer: Self-pay

## 2024-04-04 ENCOUNTER — Ambulatory Visit (INDEPENDENT_AMBULATORY_CARE_PROVIDER_SITE_OTHER): Payer: Self-pay | Admitting: Primary Care

## 2024-04-04 ENCOUNTER — Encounter (INDEPENDENT_AMBULATORY_CARE_PROVIDER_SITE_OTHER): Payer: Self-pay | Admitting: Primary Care

## 2024-04-04 ENCOUNTER — Other Ambulatory Visit: Payer: Self-pay

## 2024-04-04 VITALS — BP 154/79 | HR 72 | Resp 16 | Wt 155.4 lb

## 2024-04-04 DIAGNOSIS — E782 Mixed hyperlipidemia: Secondary | ICD-10-CM

## 2024-04-04 DIAGNOSIS — E119 Type 2 diabetes mellitus without complications: Secondary | ICD-10-CM

## 2024-04-04 DIAGNOSIS — I10 Essential (primary) hypertension: Secondary | ICD-10-CM

## 2024-04-04 DIAGNOSIS — Z760 Encounter for issue of repeat prescription: Secondary | ICD-10-CM

## 2024-04-04 DIAGNOSIS — Z7984 Long term (current) use of oral hypoglycemic drugs: Secondary | ICD-10-CM

## 2024-04-04 DIAGNOSIS — Z1211 Encounter for screening for malignant neoplasm of colon: Secondary | ICD-10-CM

## 2024-04-04 DIAGNOSIS — Z09 Encounter for follow-up examination after completed treatment for conditions other than malignant neoplasm: Secondary | ICD-10-CM

## 2024-04-04 DIAGNOSIS — Z2821 Immunization not carried out because of patient refusal: Secondary | ICD-10-CM

## 2024-04-04 DIAGNOSIS — D500 Iron deficiency anemia secondary to blood loss (chronic): Secondary | ICD-10-CM

## 2024-04-04 MED ORDER — IBUPROFEN 600 MG PO TABS
600.0000 mg | ORAL_TABLET | Freq: Three times a day (TID) | ORAL | 0 refills | Status: DC | PRN
  Filled 2024-04-04: qty 90, 30d supply, fill #0

## 2024-04-04 MED ORDER — METFORMIN HCL ER 500 MG PO TB24
500.0000 mg | ORAL_TABLET | Freq: Two times a day (BID) | ORAL | 1 refills | Status: DC
  Filled 2024-04-04: qty 180, 90d supply, fill #0
  Filled 2024-07-05: qty 180, 90d supply, fill #1

## 2024-04-04 NOTE — Progress Notes (Signed)
 [facility]  Susan [name], is a 48 y.o. female  ION:[id number]  KGM:[id number]  DOB - 11/09/1976  Chief Complaint  Patient presents with   Diabetes   Hyperlipidemia       Subjective:   Susan [name] is a 47 y.o. Hispanic female Susan [name] interpreter [phone]) initially here today for a follow up visit. Diabetes - Denies polyuria, polydipsia, polyphasia or vision changes.  Does not check blood sugars at home.  Patient has No headache, No chest pain, No abdominal pain - No Nausea, No new weakness tingling or numbness, No Cough - shortness of breath. Bp elevated only on low dose ACE for renal protection.  Patient informed provider she went to the emergency room on 03/20/2024 she reported intermittent left-sided chest pressure with palpitation that had been present for the last 3 days.  She also pain with inspiration and movement.  She is on Eliquis  for her A-fib having a painful heavy menorrhagia cycle lasing regular. She is stress about not working.  No problems updated.  Comprehensive ROS Pertinent positive and negative noted in HPI   No Known Allergies  Past Medical History:  Diagnosis Date   Atrial fibrillation (HCC)    Diabetes 1.5, managed as type 2 (HCC)    DM (diabetes mellitus) (HCC)    Hypertension    Hyperthyroidism     Current Outpatient Medications on File Prior to Visit  Medication Sig Dispense Refill   apixaban  (ELIQUIS ) 5 MG TABS tablet Take 1 tablet (5 mg total) by mouth 2 (two) times daily. 60 tablet 6   atorvastatin  (LIPITOR) 20 MG tablet Take 1 tablet (20 mg total) by mouth daily. 90 tablet 1   lisinopril  (ZESTRIL ) 5 MG tablet Take 1 tablet (5 mg total) by mouth daily. 90 tablet 1   methimazole  (TAPAZOLE ) 10 MG tablet Take 1 tablet (10 mg total) by mouth 2 (two) times daily. 90 tablet 1   metoprolol  tartrate (LOPRESSOR ) 25 MG tablet Take 0.5 tablets (12.5 mg total) by mouth in the morning AND 1 tablet (25 mg total) every evening. 60  tablet 6   No current facility-administered medications on file prior to visit.   Health Maintenance  Topic Date Due   Eye exam for diabetics  Never done   Colon Cancer Screening  Never done   COVID-19 Vaccine (1 - 2024-25 season) Never done   Hemoglobin A1C  03/24/2024   Pneumococcal Vaccination (1 of 2 - PCV) 04/04/2025*   Flu Shot  07/08/2024   Yearly kidney health urinalysis for diabetes  12/20/2024   Stool Blood Test  12/20/2024   Yearly kidney function blood test for diabetes  03/20/2025   Complete foot exam   04/04/2025   DTaP/Tdap/Td vaccine (2 - Td or Tdap) 04/14/2027   Pap with HPV screening  12/20/2028   Hepatitis C Screening  Completed   HIV Screening  Completed   HPV Vaccine  Aged Out   Meningitis B Vaccine  Aged Out  *Topic was postponed. The date shown is not the original due date.    Objective:   Vitals:   04/04/24 1133 04/04/24 1139  BP: (!) 156/78 (!) 154/79  Pulse: 72   Resp: 16   SpO2: 100%   Weight: 155 lb 6.4 oz (70.5 kg)    BP Readings from Last 3 Encounters:  04/04/24 (!) 154/79  03/20/24 129/72  02/29/24 120/82      Physical Exam Vitals reviewed.  Constitutional:      Appearance:  Normal appearance. She is obese.  HENT:     Head: Normocephalic.     Right Ear: External ear normal.     Left Ear: External ear normal.     Nose: Nose normal.     Mouth/Throat:     Mouth: Mucous membranes are moist.  Eyes:     Extraocular Movements: Extraocular movements intact.  Cardiovascular:     Rate and Rhythm: Normal rate.  Pulmonary:     Effort: Pulmonary effort is normal.     Breath sounds: Normal breath sounds.  Abdominal:     General: Bowel sounds are normal.     Palpations: Abdomen is soft.  Musculoskeletal:        General: Normal range of motion.     Cervical back: Normal range of motion.  Skin:    General: Skin is warm and dry.  Neurological:     Mental Status: She is alert and oriented to person, place, and time.  Psychiatric:         Mood and Affect: Mood normal.        Behavior: Behavior normal.        Thought Content: Thought content normal.     Assessment & Plan  Susan [name] was seen today for diabetes and hyperlipidemia.  Diagnoses and all orders for this visit:  Type 2 diabetes mellitus without complication, without long-term current use of insulin  (HCC) -     Hemoglobin A1c -     Lipid panel  Mixed hyperlipidemia -     Lipid panel  Pneumococcal vaccination declined  Screening for colon cancer -     Fecal occult blood, imunochemical; Future  Anemia due to chronic blood loss -     CBC with Differential/Platelet; Future Other orders -     ibuprofen  (ADVIL ) 600 MG tablet; Take 1 tablet (600 mg total) by mouth every 8 (eight) hours as needed.  Essential hypertension BP goal - < 130/80 Explained that having normal blood pressure is the goal and medications are helping to get to goal and maintain normal blood pressure. DIET: Limit salt intake, read nutrition labels to check salt content, limit fried and high fatty foods  Avoid using multisymptom OTC cold preparations that generally contain sudafed which can rise BP. Consult with pharmacist on best cold relief products to use for persons with HTN EXERCISE Discussed incorporating exercise such as walking - 30 minutes most days of the week and can do in 10 minute intervals    Take 2 lisinopril  in AM  Hospital discharge follow-up See HPI   Other orders -     ibuprofen  (ADVIL ) 600 MG tablet; Take 1 tablet (600 mg total) by mouth every 8 (eight) hours as needed.    Patient have been counseled extensively about nutrition and exercise. Other issues discussed during this visit include: low cholesterol diet, weight control and daily exercise, foot care, annual eye examinations at Ophthalmology, importance of adherence with medications and regular follow-up. We also discussed long term complications of uncontrolled diabetes and hypertension.   Return in about 3  weeks (around 04/25/2024) for BP, re-check blood pressure.  The patient was given clear instructions to go to ER or return to medical center if symptoms don't improve, worsen or new problems develop. The patient verbalized understanding. The patient was told to call to get lab results if they haven't heard anything in the next week.   This note has been created with Education officer, environmental. Any transcriptional  errors are unintentional.   Marius Siemens, NP 04/04/2024, 12:27 PM

## 2024-04-05 ENCOUNTER — Other Ambulatory Visit: Payer: Self-pay

## 2024-04-05 LAB — LIPID PANEL
Chol/HDL Ratio: 3.5 ratio (ref 0.0–4.4)
Cholesterol, Total: 124 mg/dL (ref 100–199)
HDL: 35 mg/dL — ABNORMAL LOW (ref >39)
LDL Chol Calc (NIH): 66 mg/dL (ref 0–99)
Triglycerides: 126 mg/dL (ref 0–149)
VLDL Cholesterol Cal: 23 mg/dL (ref 5–40)

## 2024-04-05 LAB — HEMOGLOBIN A1C
Est. average glucose Bld gHb Est-mCnc: 151 mg/dL
Hgb A1c MFr Bld: 6.9 % — ABNORMAL HIGH (ref 4.8–5.6)

## 2024-04-08 ENCOUNTER — Other Ambulatory Visit: Payer: Self-pay

## 2024-04-11 ENCOUNTER — Other Ambulatory Visit: Payer: Self-pay

## 2024-04-11 LAB — T4, FREE: Free T4: 1.3 ng/dL (ref 0.8–1.8)

## 2024-04-11 LAB — T3, FREE: T3, Free: 5.8 pg/mL — ABNORMAL HIGH (ref 2.3–4.2)

## 2024-04-11 LAB — TSH: TSH: <0.01 m[IU]/L — ABNORMAL LOW

## 2024-04-18 ENCOUNTER — Ambulatory Visit (INDEPENDENT_AMBULATORY_CARE_PROVIDER_SITE_OTHER): Payer: Self-pay | Admitting: "Endocrinology

## 2024-04-18 ENCOUNTER — Other Ambulatory Visit: Payer: Self-pay

## 2024-04-18 ENCOUNTER — Encounter: Payer: Self-pay | Admitting: "Endocrinology

## 2024-04-18 VITALS — BP 130/80 | HR 71 | Ht <= 58 in | Wt 158.0 lb

## 2024-04-18 DIAGNOSIS — E0500 Thyrotoxicosis with diffuse goiter without thyrotoxic crisis or storm: Secondary | ICD-10-CM

## 2024-04-18 DIAGNOSIS — E042 Nontoxic multinodular goiter: Secondary | ICD-10-CM

## 2024-04-18 MED ORDER — METHIMAZOLE 10 MG PO TABS
10.0000 mg | ORAL_TABLET | Freq: Three times a day (TID) | ORAL | 1 refills | Status: DC
  Filled 2024-04-18: qty 90, 30d supply, fill #0
  Filled 2024-06-03 (×2): qty 90, 30d supply, fill #1

## 2024-04-18 NOTE — Progress Notes (Signed)
 Outpatient Endocrinology Note Susan Newcomer, MD  04/18/24   Susan [name] 1976/08/02 [id number]  Referring Provider: Marius Siemens, NP Primary Care Provider: Marius Siemens, NP Subjective  No chief complaint on file.   Assessment & Plan  Diagnoses and all orders for this visit:  Graves disease -     methimazole  (TAPAZOLE ) 10 MG tablet; Take 1 tablet (10 mg total) by mouth 3 (three) times daily. -     TSH -     T3, free -     T4, free  Multiple thyroid  nodules   Retal Harlan is currently taking methimazole  10 mg thrice a day. Started MMI in 2024 TSH on MMI 10 mg bid is <0.01 with elevated FT3 at 5.8 Patient currently clinically and biochemically hyperthyroid.  Discussed the etiology for hyperthyroidism. Educated on thyroid  axis.  04/18/24 Recommend the following: Take methimazole  10 mg thrice a day. Repeat labs in 3 months or sooner if symptoms of hyper or hypothyroidism develop.  -compliance and follow up needs   -If you notice any symptoms of worsening fatigue, fever with sore throat, loss of appetite, yellowing of eyes, dark urine, joint pains, sores in the mouth, itchy rash, light colored stools or abdominal pain, please stop the medication and call us  immediately as this can be a serious side effect of the medication.  06/2023 thyroid  ultrasound reported 2 thyroid  nodules, none met criteria for f/u or FNA Both nodules 1.1 cm at max diameter and TR3 Follow up as needed I have reviewed current medications, nurse's notes, allergies, vital signs, past medical and surgical history, family medical history, and social history for this encounter. Counseled patient on symptoms, examination findings, lab findings, imaging results, treatment decisions and monitoring and prognosis. The patient understood the recommendations and agrees with the treatment plan. All questions regarding treatment plan were fully answered.  Return in  about 2 months (around 06/18/2024) for visit + labs before next visit.  Susan Newcomer, MD  04/18/24  I have reviewed current medications, nurse's notes, allergies, vital signs, past medical and surgical history, family medical history, and social history for this encounter. Counseled patient on symptoms, examination findings, lab findings, imaging results, treatment decisions and monitoring and prognosis. The patient understood the recommendations and agrees with the treatment plan. All questions regarding treatment plan were fully answered.   History of Present Illness Susan [name] is a 48 y.o. year old female who presents to our clinic with graves disease diagnosed in 2024?    Been on methimazole  5 mg every day since 06/2023.  Currently on methimazole  10 mg bid  Symptoms suggestive of HYPO/HYPERTHYROIDISM:              Fatigue yes, has anemia  weight gain/loss  Yes, some  Cold/heat intolerance Yes, variable  Constipation/hyperdefecation  No palpitations  Yes, reports one episode   Compressive symptoms:  dysphagia  No dysphonia  No positional dyspnea (especially with simultaneous arms elevation)  Yes, some subjective SOB  Smokes  No On biotin  No Personal history of head/neck surgery/irradiation  No  Adverse Drug Effects from Methimazole  (MMI): rash No fever No throat pain Yes, had cold like symptoms, resolved now  arthritis No mouth ulcers No jaundice No loss of appetite No lymphadenopathy No  06/2023 THYROID  ULTRASOUND   TECHNIQUE: Ultrasound examination of the thyroid  gland and adjacent soft tissues was performed.   COMPARISON:  None Available.   FINDINGS: Parenchymal Echotexture: Moderately heterogenous  Isthmus: 0.9 cm   Right lobe: 3.9 x 3.0 x 2.4 cm   Left lobe: 3.1 x 1.8 x 2.2 cm   _________________________________________________________   Estimated total number of nodules >/= 1 cm: 1   Number of spongiform nodules >/=  2 cm not  described below (TR1): 0   Number of mixed cystic and solid nodules >/= 1.5 cm not described below (TR2): 0   _________________________________________________________   Diffusely heterogeneous and enlarged thyroid  gland. Vascularity is within normal limits on color Doppler imaging. No significant hypervascularity.   Nodule # 1: Ill-defined solid hypoechoic nodule within the right lower gland measures 1.1 x 0.9 x 1.2 cm. Findings are consistent with TI-RADS category 3. Given size (<1.4 cm) and appearance, this nodule does NOT meet TI-RADS criteria for biopsy or dedicated follow-up.   Nodule # 2: Small hyperechoic nodule in the left lower gland measures 1.1 x 0.9 x 0.9 cm. Margins are ill-defined. No suspicious features. Findings are consistent with TI-RADS category 3. Given size (<1.4 cm) and appearance, this nodule does NOT meet TI-RADS criteria for biopsy or dedicated follow-up.   IMPRESSION: 1. Diffusely enlarged and heterogeneous thyroid  gland. Findings suggest either chronic thyroiditis or diffuse goitrous change. 2. Two small nodules are noted incidentally, 1 in the right inferior gland in 1 in the left inferior gland. Neither nodule meets criteria to warrant biopsy or dedicated imaging follow-up. No follow-up recommended.  Physical Exam  BP 130/80   Pulse 71   Ht 4' 9.5" (1.461 m)   Wt 158 lb (71.7 kg)   SpO2 98%   BMI 33.60 kg/m  Constitutional: well developed, well nourished Head: normocephalic, atraumatic, no exophthalmos Eyes: sclera anicteric, no redness Neck: no thyromegaly, no thyroid  tenderness Lungs: normal respiratory effort Neurology: alert and oriented, no fine hand tremor Skin: dry, no appreciable rashes Musculoskeletal: no appreciable defects Psychiatric: normal mood and affect  Allergies No Known Allergies  Current Medications Patient's Medications  New Prescriptions   No medications on file  Previous Medications   APIXABAN  (ELIQUIS ) 5  MG TABS TABLET    Take 1 tablet (5 mg total) by mouth 2 (two) times daily.   ATORVASTATIN  (LIPITOR) 20 MG TABLET    Take 1 tablet (20 mg total) by mouth daily.   IBUPROFEN  (ADVIL ) 600 MG TABLET    Take 1 tablet (600 mg total) by mouth every 8 (eight) hours as needed.   LISINOPRIL  (ZESTRIL ) 5 MG TABLET    Take 1 tablet (5 mg total) by mouth daily.   METFORMIN  (GLUCOPHAGE -XR) 500 MG 24 HR TABLET    Take 1 tablet (500 mg total) by mouth 2 (two) times daily.   METOPROLOL  TARTRATE (LOPRESSOR ) 25 MG TABLET    Take 0.5 tablets (12.5 mg total) by mouth in the morning AND 1 tablet (25 mg total) every evening.  Modified Medications   Modified Medication Previous Medication   METHIMAZOLE  (TAPAZOLE ) 10 MG TABLET methimazole  (TAPAZOLE ) 10 MG tablet      Take 1 tablet (10 mg total) by mouth 3 (three) times daily.    Take 1 tablet (10 mg total) by mouth 2 (two) times daily.  Discontinued Medications   No medications on file    Past Medical History Past Medical History:  Diagnosis Date   Atrial fibrillation (HCC)    Diabetes 1.5, managed as type 2 (HCC)    DM (diabetes mellitus) (HCC)    Hypertension    Hyperthyroidism     Past Surgical History History reviewed. No  pertinent surgical history.  Family History family history includes Diabetes in her father, mother, sister, and sister; Hypertension in her father.  Social History Social History   Socioeconomic History   Marital status: Single    Spouse name: Not on file   Number of children: 4   Years of education: Not on file   Highest education level: 5th grade  Occupational History   Not on file  Tobacco Use   Smoking status: Never   Smokeless tobacco: Never  Vaping Use   Vaping status: Never Used  Substance and Sexual Activity   Alcohol use: No   Drug use: No   Sexual activity: Not Currently    Birth control/protection: Condom  Other Topics Concern   Not on file  Social History Narrative   Not on file   Social Drivers of  Health   Financial Resource Strain: Medium Risk (09/18/2023)   Overall Financial Resource Strain (CARDIA)    Difficulty of Paying Living Expenses: Somewhat hard  Food Insecurity: Food Insecurity Present (09/18/2023)   Hunger Vital Sign    Worried About Running Out of Food in the Last Year: Sometimes true    Ran Out of Food in the Last Year: Sometimes true  Transportation Needs: Unmet Transportation Needs (09/18/2023)   PRAPARE - Administrator, Civil Service (Medical): Yes    Lack of Transportation (Non-Medical): Yes  Physical Activity: Not on file  Stress: Not on file  Social Connections: Not on file  Intimate Partner Violence: Not on file    Laboratory Investigations Lab Results  Component Value Date   TSH <0.01 (L) 04/11/2024   TSH <0.01 (L) 02/22/2024   TSH <0.01 (L) 01/08/2024   FREET4 1.3 04/11/2024   FREET4 1.1 02/22/2024   FREET4 1.9 (H) 01/08/2024     Lab Results  Component Value Date   TSI 280 (H) 01/08/2024     No components found for: "TRAB"   Lab Results  Component Value Date   CHOL 124 04/04/2024   Lab Results  Component Value Date   HDL 35 (L) 04/04/2024   Lab Results  Component Value Date   LDLCALC 66 04/04/2024   Lab Results  Component Value Date   TRIG 126 04/04/2024   Lab Results  Component Value Date   CHOLHDL 3.5 04/04/2024   Lab Results  Component Value Date   CREATININE 0.92 03/20/2024   No results found for: "GFR"    Component Value Date/Time   NA 135 03/20/2024 1439   NA 139 12/21/2023 1035   K 3.9 03/20/2024 1439   CL 105 03/20/2024 1439   CO2 23 03/20/2024 1439   GLUCOSE 160 (H) 03/20/2024 1439   BUN 12 03/20/2024 1439   BUN 11 12/21/2023 1035   CREATININE 0.92 03/20/2024 1439   CALCIUM  8.7 (L) 03/20/2024 1439   PROT 7.7 12/21/2023 1035   ALBUMIN 4.4 12/21/2023 1035   AST 27 12/21/2023 1035   ALT 37 (H) 12/21/2023 1035   ALKPHOS 120 12/21/2023 1035   BILITOT 0.3 12/21/2023 1035   GFRNONAA >60 03/20/2024  1439   GFRAA 115 10/16/2020 1017      Latest Ref Rng & Units 03/20/2024    2:39 PM 12/21/2023   10:35 AM 06/20/2023    8:48 AM  BMP  Glucose 70 - 99 mg/dL 161  096  045   BUN 6 - 20 mg/dL 12  11  13    Creatinine 0.44 - 1.00 mg/dL 4.09  8.11  0.35   BUN/Creat Ratio 9 - 23  19    Sodium 135 - 145 mmol/L 135  139  137   Potassium 3.5 - 5.1 mmol/L 3.9  4.8  3.6   Chloride 98 - 111 mmol/L 105  104  113   CO2 22 - 32 mmol/L 23  19  18    Calcium  8.9 - 10.3 mg/dL 8.7  9.6  9.0        Component Value Date/Time   WBC 7.9 03/20/2024 1439   RBC 4.07 03/20/2024 1439   HGB 8.9 (L) 03/20/2024 1439   HGB 11.2 12/21/2023 1035   HCT 30.6 (L) 03/20/2024 1439   HCT 37.6 12/21/2023 1035   PLT 252 03/20/2024 1439   PLT 269 12/21/2023 1035   MCV 75.2 (L) 03/20/2024 1439   MCV 70 (L) 12/21/2023 1035   MCH 21.9 (L) 03/20/2024 1439   MCHC 29.1 (L) 03/20/2024 1439   RDW 14.9 03/20/2024 1439   RDW 16.6 (H) 12/21/2023 1035   LYMPHSABS 3.4 (H) 12/21/2023 1035   MONOABS 0.8 06/06/2012 2030   EOSABS 0.0 12/21/2023 1035   BASOSABS 0.0 12/21/2023 1035      Parts of this note may have been dictated using voice recognition software. There may be variances in spelling and vocabulary which are unintentional. Not all errors are proofread. Please notify the [facility] if any discrepancies are noted or if the meaning of any statement is not clear.

## 2024-04-18 NOTE — Patient Instructions (Signed)
  If you notice any symptoms of worsening fatigue, fever with sore throat, loss of appetite, yellowing of eyes, dark urine, joint pains, sores in the mouth, itchy rash, light colored stools or abdominal pain, please stop the medication and call us immediately as this can be a serious side effect of the medication.

## 2024-04-21 ENCOUNTER — Other Ambulatory Visit: Payer: Self-pay

## 2024-04-21 DIAGNOSIS — Z1231 Encounter for screening mammogram for malignant neoplasm of breast: Secondary | ICD-10-CM

## 2024-04-25 ENCOUNTER — Ambulatory Visit (INDEPENDENT_AMBULATORY_CARE_PROVIDER_SITE_OTHER): Payer: Self-pay | Admitting: Primary Care

## 2024-04-26 ENCOUNTER — Other Ambulatory Visit: Payer: Self-pay

## 2024-05-09 ENCOUNTER — Ambulatory Visit (INDEPENDENT_AMBULATORY_CARE_PROVIDER_SITE_OTHER): Payer: Self-pay | Admitting: Primary Care

## 2024-05-17 ENCOUNTER — Other Ambulatory Visit: Payer: Self-pay

## 2024-05-17 ENCOUNTER — Other Ambulatory Visit (INDEPENDENT_AMBULATORY_CARE_PROVIDER_SITE_OTHER): Payer: Self-pay | Admitting: Primary Care

## 2024-05-17 DIAGNOSIS — Z760 Encounter for issue of repeat prescription: Secondary | ICD-10-CM

## 2024-05-17 DIAGNOSIS — E782 Mixed hyperlipidemia: Secondary | ICD-10-CM

## 2024-05-18 ENCOUNTER — Other Ambulatory Visit: Payer: Self-pay

## 2024-05-18 MED ORDER — ATORVASTATIN CALCIUM 20 MG PO TABS
20.0000 mg | ORAL_TABLET | Freq: Every day | ORAL | 1 refills | Status: DC
  Filled 2024-05-18: qty 90, 90d supply, fill #0
  Filled 2024-08-29: qty 90, 90d supply, fill #1

## 2024-05-18 NOTE — Telephone Encounter (Signed)
 Requested Prescriptions  Pending Prescriptions Disp Refills   atorvastatin  (LIPITOR) 20 MG tablet 90 tablet 1    Sig: Take 1 tablet (20 mg total) by mouth daily.     Cardiovascular:  Antilipid - Statins Failed - 05/18/2024  1:03 PM      Failed - Lipid Panel in normal range within the last 12 months    Cholesterol, Total  Date Value Ref Range Status  04/04/2024 124 100 - 199 mg/dL Final   LDL Chol Calc (NIH)  Date Value Ref Range Status  04/04/2024 66 0 - 99 mg/dL Final   HDL  Date Value Ref Range Status  04/04/2024 35 (L) >39 mg/dL Final   Triglycerides  Date Value Ref Range Status  04/04/2024 126 0 - 149 mg/dL Final         Passed - Patient is not pregnant      Passed - Valid encounter within last 12 months    Recent Outpatient Visits           1 month ago Type 2 diabetes mellitus without complication, without long-term current use of insulin  (HCC)   [place] [facility] Marius Siemens, NP   4 months ago Type 2 diabetes mellitus without complication, without long-term current use of insulin  [facility])   [place] [facility] Marius Siemens, NP   7 months ago Influenza vaccination declined   [place] [facility] Marius Siemens, NP   10 months ago Hyperthyroidism   [place] [facility] Marius Siemens, NP   10 months ago Hyperthyroidism   [place] [facility] Marius Siemens, NP

## 2024-05-24 ENCOUNTER — Ambulatory Visit (INDEPENDENT_AMBULATORY_CARE_PROVIDER_SITE_OTHER): Payer: Self-pay | Admitting: Primary Care

## 2024-05-27 ENCOUNTER — Other Ambulatory Visit (INDEPENDENT_AMBULATORY_CARE_PROVIDER_SITE_OTHER): Payer: Self-pay | Admitting: Primary Care

## 2024-05-27 ENCOUNTER — Other Ambulatory Visit: Payer: Self-pay

## 2024-05-27 MED ORDER — LISINOPRIL 5 MG PO TABS
5.0000 mg | ORAL_TABLET | Freq: Every day | ORAL | 1 refills | Status: DC
  Filled 2024-05-27 – 2024-06-13 (×2): qty 90, 90d supply, fill #0
  Filled 2024-08-29: qty 90, 90d supply, fill #1

## 2024-06-03 ENCOUNTER — Other Ambulatory Visit: Payer: Self-pay

## 2024-06-06 ENCOUNTER — Telehealth (INDEPENDENT_AMBULATORY_CARE_PROVIDER_SITE_OTHER): Payer: Self-pay | Admitting: Primary Care

## 2024-06-06 NOTE — Telephone Encounter (Signed)
 Left VM with pt about their upcoming appt.

## 2024-06-07 ENCOUNTER — Other Ambulatory Visit (HOSPITAL_COMMUNITY)
Admission: RE | Admit: 2024-06-07 | Discharge: 2024-06-07 | Disposition: A | Discharge status: Home / Self Care | Payer: Self-pay | Source: Ambulatory Visit | Attending: Primary Care | Admitting: Primary Care

## 2024-06-07 ENCOUNTER — Ambulatory Visit (INDEPENDENT_AMBULATORY_CARE_PROVIDER_SITE_OTHER): Payer: Self-pay | Admitting: Primary Care

## 2024-06-07 ENCOUNTER — Other Ambulatory Visit: Payer: Self-pay

## 2024-06-07 ENCOUNTER — Encounter (INDEPENDENT_AMBULATORY_CARE_PROVIDER_SITE_OTHER): Payer: Self-pay | Admitting: Primary Care

## 2024-06-07 VITALS — BP 134/78 | HR 61 | Resp 16 | Wt 159.2 lb

## 2024-06-07 DIAGNOSIS — N3000 Acute cystitis without hematuria: Secondary | ICD-10-CM

## 2024-06-07 DIAGNOSIS — R300 Dysuria: Secondary | ICD-10-CM

## 2024-06-07 DIAGNOSIS — I10 Essential (primary) hypertension: Secondary | ICD-10-CM

## 2024-06-07 DIAGNOSIS — N898 Other specified noninflammatory disorders of vagina: Secondary | ICD-10-CM

## 2024-06-07 LAB — POCT URINALYSIS DIP (CLINITEK)
Bilirubin, UA: NEGATIVE
Blood, UA: NEGATIVE
Glucose, UA: NEGATIVE mg/dL
Ketones, POC UA: NEGATIVE mg/dL
Nitrite, UA: NEGATIVE
POC PROTEIN,UA: NEGATIVE
Spec Grav, UA: <=1.005 — AB (ref 1.010–1.025)
Urobilinogen, UA: 0.2 U/dL
pH, UA: 5.5 (ref 5.0–8.0)

## 2024-06-07 MED ORDER — PHENAZOPYRIDINE HCL 100 MG PO TABS
100.0000 mg | ORAL_TABLET | Freq: Three times a day (TID) | ORAL | 0 refills | Status: DC | PRN
Start: 2024-06-07 — End: 2024-06-26
  Filled 2024-06-07: qty 10, 4d supply, fill #0

## 2024-06-07 NOTE — Progress Notes (Signed)
 [facility]   Subjective:   Susan [name] is a 48 y.o. 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 Discharge described as: copious and mucoid. Vaginal symptoms include burning, local irritation, and vulvar itching.Vulvar symptoms include burning, local irritation, and vulvar itching.STI Risk: Very low risk of STD exposure.   Other associated symptoms: none.Menstrual pattern: She had been bleeding regularly. Contraception: none.  She denies fussiness, diarrhea, cough, and difficulty breathing recent antibiotic exposure, denies vomiting, abdominal pain, fussiness, diarrhea, cough, and difficulty breathing changes in soaps, detergents coinciding with the onset of her symptoms.  She has not previously self treated or been under treatment by another provider for these symptoms.     Gynecologic History 05/13/24 LMP Past Medical History:  Diagnosis Date   Atrial fibrillation (HCC)    Diabetes 1.5, managed as type 2 (HCC)    DM (diabetes mellitus) (HCC)    Hypertension    Hyperthyroidism     No past surgical history on file.  Current Outpatient Medications on File Prior to Visit  Medication Sig Dispense Refill   apixaban  (ELIQUIS ) 5 MG TABS tablet Take 1 tablet (5 mg total) by mouth 2 (two) times daily. 60 tablet 6   atorvastatin  (LIPITOR) 20 MG tablet Take 1 tablet (20 mg total) by mouth daily. 90 tablet 1   ibuprofen  (ADVIL ) 600 MG tablet Take 1 tablet (600 mg total) by mouth every 8 (eight) hours as needed. 90 tablet 0   lisinopril  (ZESTRIL ) 5 MG tablet Take 1 tablet (5 mg total) by mouth daily. 90 tablet 1   metFORMIN  (GLUCOPHAGE -XR) 500 MG 24 hr tablet Take 1 tablet (500 mg total) by mouth 2 (two) times daily. 180 tablet 1   methimazole  (TAPAZOLE ) 10 MG tablet Take 1 tablet (10 mg total) by mouth 3 (three) times daily. 90 tablet 1   metoprolol  tartrate (LOPRESSOR ) 25 MG tablet Take 0.5 tablets (12.5 mg total)  by mouth in the morning AND 1 tablet (25 mg total) every evening. 60 tablet 6   No current facility-administered medications on file prior to visit.    No Known Allergies  Social History   Socioeconomic History   Marital status: Single    Spouse name: Not on file   Number of children: 4   Years of education: Not on file   Highest education level: 5th grade  Occupational History   Not on file  Tobacco Use   Smoking status: Never   Smokeless tobacco: Never  Vaping Use   Vaping status: Never Used  Substance and Sexual Activity   Alcohol use: No   Drug use: No   Sexual activity: Not Currently    Birth control/protection: Condom  Other Topics Concern   Not on file  Social History Narrative   Not on file   Social Drivers of Health   Financial Resource Strain: Medium Risk (09/18/2023)   Overall Financial Resource Strain (CARDIA)    Difficulty of Paying Living Expenses: Somewhat hard  Food Insecurity: Food Insecurity Present (09/18/2023)   Hunger Vital Sign    Worried About Running Out of Food in the Last Year: Sometimes true    Ran Out of Food in the Last Year: Sometimes true  Transportation Needs: No Transportation Needs (06/07/2024)   PRAPARE - Administrator, Civil Service (Medical): No    Lack of Transportation (Non-Medical): No  Physical Activity: Not on file  Stress: Not on file  Social  Connections: Not on file  Intimate Partner Violence: Not on file    Family History  Problem Relation Age of Onset   Diabetes Mother    Diabetes Father    Hypertension Father    Diabetes Sister    Diabetes Sister    Breast cancer Neg Hx     The following portions of the patient's history were reviewed and updated as appropriate: allergies, current medications, past family history, past medical history, past social history, past surgical history and problem list.  Review of Systems Comprehensive ROS Pertinent positive and negative noted in HPI    Objective:  BP  134/78 (BP Location: Left Arm, Patient Position: Sitting, Cuff Size: Large)   Pulse 61   Resp 16   Wt 159 lb 3.2 oz (72.2 kg)   SpO2 99%   BMI 33.85 kg/m   Physical Exam Vitals reviewed.  Constitutional:      Appearance: Normal appearance. She is obese.  HENT:     Head: Normocephalic.     Right Ear: External ear normal.     Left Ear: External ear normal.     Nose: Nose normal.     Mouth/Throat:     Mouth: Mucous membranes are moist.   Eyes:     Extraocular Movements: Extraocular movements intact.     Pupils: Pupils are equal, round, and reactive to light.    Cardiovascular:     Rate and Rhythm: Normal rate.  Pulmonary:     Effort: Pulmonary effort is normal.     Breath sounds: Normal breath sounds.  Abdominal:     General: Bowel sounds are normal.     Palpations: Abdomen is soft.   Musculoskeletal:        General: Normal range of motion.     Cervical back: Normal range of motion and neck supple.   Skin:    General: Skin is warm and dry.   Neurological:     Mental Status: She is alert and oriented to person, place, and time.   Psychiatric:        Mood and Affect: Mood normal.        Behavior: Behavior normal.        Thought Content: Thought content normal.      Assessment:  Susan [name] was seen today for blood pressure check and vaginal itching.  Diagnoses and all orders for this visit:  Burning with urination -     POCT URINALYSIS DIP (CLINITEK)  Vaginal itching -     Cervicovaginal ancillary only  Essential hypertension BP goal - < 130/80 Explained that having normal blood pressure is the goal and medications are helping to get to goal and maintain normal blood pressure. DIET: Limit salt intake, read nutrition labels to check salt content, limit fried and high fatty foods  Avoid using multisymptom OTC cold preparations that generally contain sudafed which can rise BP. Consult with pharmacist on best cold relief products to use for persons with  HTN EXERCISE Discussed incorporating exercise such as walking - 30 minutes most days of the week and can do in 10 minute intervals       If tests results are positive, please abstain from sexual activity until you and your partner(s) have been treated Return precautions given to come here or go to ER if you have any new or worsening symptoms fever, chills, nausea, vomiting, abdominal or pelvic pain, painful intercourse, vaginal discharge, vaginal bleeding, persistent symptoms despite treatment, etc...  Reviewed expectations re: course of  current medical issues. Questions answered. Outlined signs and symptoms indicating need for more acute intervention. Patient verbalized understanding. After Visit Summary given.   Plan:   Will follow up on all labs ordered today.  Patient counseled regarding condom use with each sexual activity to promote wellness and prevention of transmission of HIV, syphilis, herpes simplex virus, gonorrhea, chlamydia and trichomoniasis.   This note has been created with Education officer, environmental. Any transcriptional errors are unintentional.   Susan SHAUNNA Bohr, NP 06/07/2024, 10:08 AM

## 2024-06-09 LAB — CERVICOVAGINAL ANCILLARY ONLY
Bacterial Vaginitis (gardnerella): NEGATIVE
Candida Glabrata: NEGATIVE
Candida Vaginitis: POSITIVE — AB
Chlamydia: NEGATIVE
Comment: NEGATIVE
Comment: NEGATIVE
Comment: NEGATIVE
Comment: NEGATIVE
Comment: NEGATIVE
Comment: NORMAL
Neisseria Gonorrhea: NEGATIVE
Trichomonas: NEGATIVE

## 2024-06-09 LAB — URINE CULTURE

## 2024-06-13 ENCOUNTER — Other Ambulatory Visit: Payer: Self-pay

## 2024-06-15 LAB — T3, FREE: T3, Free: 3.6 pg/mL (ref 2.3–4.2)

## 2024-06-15 LAB — T4, FREE: Free T4: 1.2 ng/dL (ref 0.8–1.8)

## 2024-06-15 LAB — TSH: TSH: <0.01 m[IU]/L — ABNORMAL LOW

## 2024-06-20 ENCOUNTER — Other Ambulatory Visit: Payer: Self-pay

## 2024-06-20 ENCOUNTER — Encounter: Payer: Self-pay | Admitting: "Endocrinology

## 2024-06-20 ENCOUNTER — Ambulatory Visit (INDEPENDENT_AMBULATORY_CARE_PROVIDER_SITE_OTHER): Payer: Self-pay | Admitting: "Endocrinology

## 2024-06-20 VITALS — BP 130/80 | HR 64 | Ht <= 58 in | Wt 160.0 lb

## 2024-06-20 DIAGNOSIS — E0500 Thyrotoxicosis with diffuse goiter without thyrotoxic crisis or storm: Secondary | ICD-10-CM

## 2024-06-20 DIAGNOSIS — E042 Nontoxic multinodular goiter: Secondary | ICD-10-CM

## 2024-06-20 LAB — TSH: TSH: <0.01 m[IU]/L — ABNORMAL LOW

## 2024-06-20 LAB — T4, FREE: Free T4: 0.9 ng/dL (ref 0.8–1.8)

## 2024-06-20 LAB — T3, FREE: T3, Free: 3.9 pg/mL (ref 2.3–4.2)

## 2024-06-20 MED ORDER — METHIMAZOLE 10 MG PO TABS
10.0000 mg | ORAL_TABLET | Freq: Three times a day (TID) | ORAL | 0 refills | Status: DC
  Filled 2024-06-20: qty 270, 90d supply, fill #0
  Filled 2024-07-05: qty 90, 30d supply, fill #0
  Filled 2024-08-09: qty 90, 30d supply, fill #1
  Filled 2024-09-07: qty 90, 30d supply, fill #2

## 2024-06-20 NOTE — Progress Notes (Signed)
 Outpatient Endocrinology Note [name], MD  06/20/24   Susan [name] 11-24-1976 [id number]  Referring Provider: Celestia Rosaline SQUIBB, NP Primary Care Provider: Celestia Rosaline SQUIBB, NP Subjective  No chief complaint on file.   Assessment & Plan  Diagnoses and all orders for this visit:  Graves disease -     TSH -     T3, free -     T4, free -     methimazole  (TAPAZOLE ) 10 MG tablet; Take 1 tablet (10 mg total) by mouth 3 (three) times daily.  Multiple thyroid  nodules   Susan [name] is currently taking methimazole  10 mg thrice a day. Started MMI in 2024 TSH on MMI 10 mg bid is <0.01 with elevated FT3 at 5.8 Patient currently clinically and biochemically hyperthyroid.  Discussed the etiology for hyperthyroidism. Educated on thyroid  axis.  06/20/24 Recommend the following: Continue methimazole  10 mg thrice a day. FT3 normalized now.  Repeat labs in 3 months or sooner if symptoms of hyper or hypothyroidism develop.  -compliance and follow up needs   -If you notice any symptoms of worsening fatigue, fever with sore throat, loss of appetite, yellowing of eyes, dark urine, joint pains, sores in the mouth, itchy rash, light colored stools or abdominal pain, please stop the medication and call us  immediately as this can be a serious side effect of the medication.  06/2023 thyroid  ultrasound reported 2 thyroid  nodules, none met criteria for f/u or FNA Both nodules 1.1 cm at max diameter and TR3 Follow up as needed I have reviewed current medications, nurse's notes, allergies, vital signs, past medical and surgical history, family medical history, and social history for this encounter. Counseled patient on symptoms, examination findings, lab findings, imaging results, treatment decisions and monitoring and prognosis. The patient understood the recommendations and agrees with the treatment plan. All questions regarding treatment plan were fully  answered.  Return in about 3 months (around 09/20/2024).  [name], MD  06/20/24  I have reviewed current medications, nurse's notes, allergies, vital signs, past medical and surgical history, family medical history, and social history for this encounter. Counseled patient on symptoms, examination findings, lab findings, imaging results, treatment decisions and monitoring and prognosis. The patient understood the recommendations and agrees with the treatment plan. All questions regarding treatment plan were fully answered.   History of Present Illness Susan [name] is a 48 y.o. year old female who presents to our clinic with graves disease diagnosed in 2024?    Been on methimazole  5 mg every day since 06/2023.  Currently on methimazole  10 mg tid  Symptoms suggestive of HYPO/HYPERTHYROIDISM:              Fatigue no weight gain/loss  No, stable compared to last visit  Cold/heat intolerance Yes, variable  Constipation/hyperdefecation  No palpitations  Yes, rare and self limiting    Compressive symptoms:  dysphagia  No dysphonia  No positional dyspnea (especially with simultaneous arms elevation)  Yes, some subjective SOB  Smokes  No On biotin  No Personal history of head/neck surgery/irradiation  No  Adverse Drug Effects from Methimazole  (MMI): rash No fever No throat pain No arthritis No mouth ulcers No jaundice No loss of appetite No lymphadenopathy No  Grave's Ophthalmopathy Clinical Activity Score: 0/9  06/2023 THYROID  ULTRASOUND   TECHNIQUE: Ultrasound examination of the thyroid  gland and adjacent soft tissues was performed.   COMPARISON:  None Available.   FINDINGS: Parenchymal Echotexture: Moderately heterogenous  Isthmus: 0.9 cm   Right lobe: 3.9 x 3.0 x 2.4 cm   Left lobe: 3.1 x 1.8 x 2.2 cm   _________________________________________________________   Estimated total number of nodules >/= 1 cm: 1   Number of spongiform  nodules >/=  2 cm not described below (TR1): 0   Number of mixed cystic and solid nodules >/= 1.5 cm not described below (TR2): 0   _________________________________________________________   Diffusely heterogeneous and enlarged thyroid  gland. Vascularity is within normal limits on color Doppler imaging. No significant hypervascularity.   Nodule # 1: Ill-defined solid hypoechoic nodule within the right lower gland measures 1.1 x 0.9 x 1.2 cm. Findings are consistent with TI-RADS category 3. Given size (<1.4 cm) and appearance, this nodule does NOT meet TI-RADS criteria for biopsy or dedicated follow-up.   Nodule # 2: Small hyperechoic nodule in the left lower gland measures 1.1 x 0.9 x 0.9 cm. Margins are ill-defined. No suspicious features. Findings are consistent with TI-RADS category 3. Given size (<1.4 cm) and appearance, this nodule does NOT meet TI-RADS criteria for biopsy or dedicated follow-up.   IMPRESSION: 1. Diffusely enlarged and heterogeneous thyroid  gland. Findings suggest either chronic thyroiditis or diffuse goitrous change. 2. Two small nodules are noted incidentally, 1 in the right inferior gland in 1 in the left inferior gland. Neither nodule meets criteria to warrant biopsy or dedicated imaging follow-up. No follow-up recommended.  Physical Exam  BP 130/80   Pulse 64   Ht 4' 9.5 (1.461 m)   Wt 160 lb (72.6 kg)   SpO2 97%   BMI 34.02 kg/m  Constitutional: well developed, well nourished Head: normocephalic, atraumatic, no exophthalmos Eyes: sclera anicteric, no redness Neck: no thyromegaly, no thyroid  tenderness Lungs: normal respiratory effort Neurology: alert and oriented, no fine hand tremor Skin: dry, no appreciable rashes Musculoskeletal: no appreciable defects Psychiatric: normal mood and affect  Allergies No Known Allergies  Current Medications Patient's Medications  New Prescriptions   No medications on file  Previous Medications    APIXABAN  (ELIQUIS ) 5 MG TABS TABLET    Take 1 tablet (5 mg total) by mouth 2 (two) times daily.   ATORVASTATIN  (LIPITOR) 20 MG TABLET    Take 1 tablet (20 mg total) by mouth daily.   IBUPROFEN  (ADVIL ) 600 MG TABLET    Take 1 tablet (600 mg total) by mouth every 8 (eight) hours as needed.   LISINOPRIL  (ZESTRIL ) 5 MG TABLET    Take 1 tablet (5 mg total) by mouth daily.   METFORMIN  (GLUCOPHAGE -XR) 500 MG 24 HR TABLET    Take 1 tablet (500 mg total) by mouth 2 (two) times daily.   METOPROLOL  TARTRATE (LOPRESSOR ) 25 MG TABLET    Take 0.5 tablets (12.5 mg total) by mouth in the morning AND 1 tablet (25 mg total) every evening.   PHENAZOPYRIDINE  (PYRIDIUM ) 100 MG TABLET    Take 1 tablet (100 mg total) by mouth 3 (three) times daily as needed for pain.  Modified Medications   Modified Medication Previous Medication   METHIMAZOLE  (TAPAZOLE ) 10 MG TABLET methimazole  (TAPAZOLE ) 10 MG tablet      Take 1 tablet (10 mg total) by mouth 3 (three) times daily.    Take 1 tablet (10 mg total) by mouth 3 (three) times daily.  Discontinued Medications   No medications on file    Past Medical History Past Medical History:  Diagnosis Date   Atrial fibrillation (HCC)    Diabetes 1.5, managed as type 2 (  HCC)    DM (diabetes mellitus) (HCC)    Hypertension    Hyperthyroidism     Past Surgical History History reviewed. No pertinent surgical history.  Family History family history includes Diabetes in her father, mother, sister, and sister; Hypertension in her father.  Social History Social History   Socioeconomic History   Marital status: Single    Spouse name: Not on file   Number of children: 4   Years of education: Not on file   Highest education level: 5th grade  Occupational History   Not on file  Tobacco Use   Smoking status: Never   Smokeless tobacco: Never  Vaping Use   Vaping status: Never Used  Substance and Sexual Activity   Alcohol use: No   Drug use: No   Sexual activity: Not  Currently    Birth control/protection: Condom  Other Topics Concern   Not on file  Social History Narrative   Not on file   Social Drivers of Health   Financial Resource Strain: Medium Risk (09/18/2023)   Overall Financial Resource Strain (CARDIA)    Difficulty of Paying Living Expenses: Somewhat hard  Food Insecurity: Food Insecurity Present (09/18/2023)   Hunger Vital Sign    Worried About Running Out of Food in the Last Year: Sometimes true    Ran Out of Food in the Last Year: Sometimes true  Transportation Needs: No Transportation Needs (06/07/2024)   PRAPARE - Administrator, Civil Service (Medical): No    Lack of Transportation (Non-Medical): No  Physical Activity: Not on file  Stress: Not on file  Social Connections: Not on file  Intimate Partner Violence: Not on file    Laboratory Investigations Lab Results  Component Value Date   TSH <0.01 (L) 06/13/2024   TSH <0.01 (L) 04/11/2024   TSH <0.01 (L) 02/22/2024   FREET4 1.2 06/13/2024   FREET4 1.3 04/11/2024   FREET4 1.1 02/22/2024     Lab Results  Component Value Date   TSI 280 (H) 01/08/2024     No components found for: TRAB   Lab Results  Component Value Date   CHOL 124 04/04/2024   Lab Results  Component Value Date   HDL 35 (L) 04/04/2024   Lab Results  Component Value Date   LDLCALC 66 04/04/2024   Lab Results  Component Value Date   TRIG 126 04/04/2024   Lab Results  Component Value Date   CHOLHDL 3.5 04/04/2024   Lab Results  Component Value Date   CREATININE 0.92 03/20/2024   No results found for: GFR    Component Value Date/Time   NA 135 03/20/2024 1439   NA 139 12/21/2023 1035   K 3.9 03/20/2024 1439   CL 105 03/20/2024 1439   CO2 23 03/20/2024 1439   GLUCOSE 160 (H) 03/20/2024 1439   BUN 12 03/20/2024 1439   BUN 11 12/21/2023 1035   CREATININE 0.92 03/20/2024 1439   CALCIUM  8.7 (L) 03/20/2024 1439   PROT 7.7 12/21/2023 1035   ALBUMIN 4.4 12/21/2023 1035   AST  27 12/21/2023 1035   ALT 37 (H) 12/21/2023 1035   ALKPHOS 120 12/21/2023 1035   BILITOT 0.3 12/21/2023 1035   GFRNONAA >60 03/20/2024 1439   GFRAA 115 10/16/2020 1017      Latest Ref Rng & Units 03/20/2024    2:39 PM 12/21/2023   10:35 AM 06/20/2023    8:48 AM  BMP  Glucose 70 - 99 mg/dL 839  883  144   BUN 6 - 20 mg/dL 12  11  13    Creatinine 0.44 - 1.00 mg/dL 9.07  9.42  9.64   BUN/Creat Ratio 9 - 23  19    Sodium 135 - 145 mmol/L 135  139  137   Potassium 3.5 - 5.1 mmol/L 3.9  4.8  3.6   Chloride 98 - 111 mmol/L 105  104  113   CO2 22 - 32 mmol/L 23  19  18    Calcium  8.9 - 10.3 mg/dL 8.7  9.6  9.0        Component Value Date/Time   WBC 7.9 03/20/2024 1439   RBC 4.07 03/20/2024 1439   HGB 8.9 (L) 03/20/2024 1439   HGB 11.2 12/21/2023 1035   HCT 30.6 (L) 03/20/2024 1439   HCT 37.6 12/21/2023 1035   PLT 252 03/20/2024 1439   PLT 269 12/21/2023 1035   MCV 75.2 (L) 03/20/2024 1439   MCV 70 (L) 12/21/2023 1035   MCH 21.9 (L) 03/20/2024 1439   MCHC 29.1 (L) 03/20/2024 1439   RDW 14.9 03/20/2024 1439   RDW 16.6 (H) 12/21/2023 1035   LYMPHSABS 3.4 (H) 12/21/2023 1035   MONOABS 0.8 06/06/2012 2030   EOSABS 0.0 12/21/2023 1035   BASOSABS 0.0 12/21/2023 1035      Parts of this note may have been dictated using voice recognition software. There may be variances in spelling and vocabulary which are unintentional. Not all errors are proofread. Please notify the dino if any discrepancies are noted or if the meaning of any statement is not clear.

## 2024-06-21 ENCOUNTER — Other Ambulatory Visit: Payer: Self-pay

## 2024-06-21 ENCOUNTER — Other Ambulatory Visit: Payer: Self-pay | Admitting: Primary Care

## 2024-06-21 ENCOUNTER — Telehealth (INDEPENDENT_AMBULATORY_CARE_PROVIDER_SITE_OTHER): Payer: Self-pay

## 2024-06-21 DIAGNOSIS — B379 Candidiasis, unspecified: Secondary | ICD-10-CM

## 2024-06-21 MED ORDER — FLUCONAZOLE 150 MG PO TABS
150.0000 mg | ORAL_TABLET | Freq: Once | ORAL | 0 refills | Status: AC
  Filled 2024-06-21: qty 1, 1d supply, fill #0

## 2024-06-21 NOTE — Telephone Encounter (Signed)
 Copied from CRM [id number]. Topic: Clinical - Lab/Test Results >> Jun 21, 2024 11:00 AM Montie POUR wrote: Reason for CRM:  Please call Susan [name] with an interpreter to discuss her urine culture results. She is still having burning when going to the bathroom. Please call her at [phone]

## 2024-06-21 NOTE — Telephone Encounter (Signed)
 Will forward to provider

## 2024-06-22 ENCOUNTER — Other Ambulatory Visit: Payer: Self-pay

## 2024-06-23 ENCOUNTER — Ambulatory Visit (HOSPITAL_COMMUNITY)
Admission: RE | Admit: 2024-06-23 | Discharge: 2024-06-23 | Disposition: A | Discharge status: Home / Self Care | Payer: Self-pay | Source: Ambulatory Visit | Attending: Internal Medicine | Admitting: Internal Medicine

## 2024-06-23 VITALS — BP 114/58 | HR 72 | Ht <= 58 in | Wt 159.2 lb

## 2024-06-23 DIAGNOSIS — I480 Paroxysmal atrial fibrillation: Secondary | ICD-10-CM

## 2024-06-23 DIAGNOSIS — I4891 Unspecified atrial fibrillation: Secondary | ICD-10-CM

## 2024-06-23 DIAGNOSIS — D6869 Other thrombophilia: Secondary | ICD-10-CM

## 2024-06-23 NOTE — Progress Notes (Signed)
 Primary Care Physician: Celestia Rosaline SQUIBB, NP Primary Cardiologist: Shelda Bruckner, MD Electrophysiologist: None  Referring Physician: Dr Madelyne Countryman Susan [name] is a 48 y.o. female with a history of DM, hyperthyroidism, HTN, atrial fibrillation who presents for consultation in the [facility]. The patient was initially diagnosed with atrial fibrillation 06/18/23 after presenting to the ED with symptoms of tachypalpitations. ECG showed afib with RVR and she was started on diltiazem  which converted her to SR. Patient was also started on Eliquis  for a CHADS2VASC score of 3. TSH on admission was <0.01, T3 and T4 at 23.8 and 4.47 respectively. She was started on methimazole . Echo showed normal EF, mild MR, mild AS.   On evaluation today, she is currently in NSR. She has remained compliant with methimazole  for thyroid  treatment. She has not had any episodes of Afib since hospital visit. She has been taking accidentally 1 whole tablet of metoprolol  twice daily instead of half tablet twice daily. No missed Eliquis . She has an Apple watch.   On follow up 12/24/23, she is currently in NSR. She has had no episodes of Afib since last office visit. She has noted some brief palpitations in the evening before bed at times. She remains compliant with treatment for hyperthyroidism. No bleeding issues on Eliquis ; patient notes she is likely beginning menopause.   On follow up 06/23/24, she is currently in NSR. She has had low burden of Afib overall. Patient continues to have menstrual cycles. Last CBC 03/20/24 with Hgb 8.9. No missed doses of Eliquis .   Today, she denies symptoms of palpitations, chest pain, shortness of breath, orthopnea, PND, lower extremity edema, dizziness, presyncope, syncope, snoring, daytime somnolence, bleeding, or neurologic sequela. The patient is tolerating medications without difficulties and is otherwise without complaint today.    Atrial Fibrillation Management history:  Previous antiarrhythmic drugs: none Previous cardioversions: none Previous ablations: none Anticoagulation history: Eliquis   ROS- All systems are reviewed and negative except as per the HPI above.   Physical Exam: BP (!) 114/58   Pulse 72   Ht 4' 9.5 (1.461 m)   Wt 72.2 kg   BMI 33.85 kg/m   GEN- The patient is well appearing, alert and oriented x 3 today.   Neck - no JVD or carotid bruit noted Lungs- Clear to ausculation bilaterally, normal work of breathing Heart- Regular rate and rhythm, no murmurs, rubs or gallops, PMI not laterally displaced Extremities- no clubbing, cyanosis, or edema Skin - no rash or ecchymosis noted   Wt Readings from Last 3 Encounters:  06/23/24 72.2 kg  06/20/24 72.6 kg  06/07/24 72.2 kg     EKG today demonstrates  Vent. rate 72 BPM PR interval 136 ms QRS duration 74 ms QT/QTcB 374/409 ms P-R-T axes 2 52 11 Normal sinus rhythm Normal ECG When compared with ECG of 20-Mar-2024 14:16, PREVIOUS ECG IS PRESENT  Echo 06/20/23 demonstrated   1. Left ventricular ejection fraction, by estimation, is 60 to 65%. The  left ventricle has normal function. The left ventricle has no regional  wall motion abnormalities. Left ventricular diastolic parameters were  normal.   2. Right ventricular systolic function is normal. The right ventricular  size is normal. There is moderately elevated pulmonary artery systolic  pressure.   3. The mitral valve is normal in structure. Mild mitral valve  regurgitation. No evidence of mitral stenosis.   4. The aortic valve is tricuspid. Aortic valve regurgitation is mild.  Mild  aortic valve stenosis. Aortic valve mean gradient measures 10.0 mmHg.  Aortic valve Vmax measures 2.30 m/s.   5. The inferior vena cava is dilated in size with <50% respiratory  variability, suggesting right atrial pressure of 15 mmHg.   Comparison(s): No prior Echocardiogram.    CHA2DS2-VASc  Score = 3  The patient's score is based upon: CHF History: 0 HTN History: 1 Diabetes History: 1 Stroke History: 0 Vascular Disease History: 0 Age Score: 0 Gender Score: 1       ASSESSMENT AND PLAN: Paroxysmal Atrial Fibrillation (ICD10:  I48.0) The patient's CHA2DS2-VASc score is 3, indicating a 3.2% annual risk of stroke.    She is currently in NSR.   Suspect secondary to hyperthyroidism; appears to have low burden of Afib. Rhythm monitoring device recommended.  Continue Lopressor  12.5 mg AM, 25 mg PM.   Secondary Hypercoagulable State (ICD10:  D68.69) The patient is at significant risk for stroke/thromboembolism based upon her CHA2DS2-VASc Score of 3.  Continue Apixaban  (Eliquis ).  No missed doses.   We discussed options considering patient has ongoing anemia and heavy bleeding during cycle. We discussed the procedure of ILR implantation but this is a substantial cost and patient is currently self pay. After discussion, patient will utilize Kardiamobile to check her rhythm daily and during week of cycle will temporarily discontinue Eliquis , resuming it after cycle is completed. We will revisit this at next OV, or if patient has increase in Afib then will contact clinic for further direction.  HTN Stable today.   Hyperthyroidism On methimazole     Follow up 6 months Afib clinic.    [name], PA-C Afib Clinic [facility] [address] [place], [place] [address] [phone]

## 2024-06-23 NOTE — Patient Instructions (Signed)
 Kardiamobile device

## 2024-06-24 ENCOUNTER — Telehealth (HOSPITAL_COMMUNITY): Payer: Self-pay | Admitting: Internal Medicine

## 2024-06-24 NOTE — Telephone Encounter (Signed)
 Called patient with interpreter to inform has a yeast infection and medication.

## 2024-06-24 NOTE — Telephone Encounter (Signed)
 Patient called today and I spoke with her. She did not mention to me during OV yesterday (7/17) that she was actively in her menstrual cycle. She tells me today she woke up dizzy and that her period has been ongoing for 9 days. She noted it is going on for longer than normal. She asks me if she should go to the ED and I recommended for patient to do two things: stop Eliquis  and resume once her menstrual cycle has finished / no longer bleeding. The other recommendation was to schedule follow up with her PCP / call them for further recommendations on prolonged bleeding. She thanked me for call.

## 2024-06-26 ENCOUNTER — Other Ambulatory Visit (HOSPITAL_COMMUNITY): Payer: Self-pay

## 2024-06-26 ENCOUNTER — Encounter (HOSPITAL_COMMUNITY): Payer: Self-pay | Admitting: *Deleted

## 2024-06-26 ENCOUNTER — Other Ambulatory Visit: Payer: Self-pay

## 2024-06-26 ENCOUNTER — Observation Stay (HOSPITAL_COMMUNITY): Payer: Self-pay

## 2024-06-26 ENCOUNTER — Observation Stay (HOSPITAL_COMMUNITY)
Admission: EM | Admit: 2024-06-26 | Discharge: 2024-06-27 | Disposition: A | Discharge status: Home / Self Care | Payer: Self-pay | Attending: Internal Medicine | Admitting: Internal Medicine

## 2024-06-26 ENCOUNTER — Ambulatory Visit (HOSPITAL_COMMUNITY)
Admission: EM | Admit: 2024-06-26 | Discharge: 2024-06-26 | Disposition: A | Discharge status: Home / Self Care | Payer: Self-pay | Attending: Emergency Medicine | Admitting: Emergency Medicine

## 2024-06-26 ENCOUNTER — Encounter (HOSPITAL_COMMUNITY): Payer: Self-pay

## 2024-06-26 DIAGNOSIS — N939 Abnormal uterine and vaginal bleeding, unspecified: Secondary | ICD-10-CM

## 2024-06-26 DIAGNOSIS — N920 Excessive and frequent menstruation with regular cycle: Secondary | ICD-10-CM

## 2024-06-26 DIAGNOSIS — E039 Hypothyroidism, unspecified: Secondary | ICD-10-CM | POA: Insufficient documentation

## 2024-06-26 DIAGNOSIS — I10 Essential (primary) hypertension: Secondary | ICD-10-CM | POA: Diagnosis present

## 2024-06-26 DIAGNOSIS — E785 Hyperlipidemia, unspecified: Secondary | ICD-10-CM | POA: Insufficient documentation

## 2024-06-26 DIAGNOSIS — R42 Dizziness and giddiness: Secondary | ICD-10-CM

## 2024-06-26 DIAGNOSIS — E119 Type 2 diabetes mellitus without complications: Secondary | ICD-10-CM | POA: Insufficient documentation

## 2024-06-26 DIAGNOSIS — D62 Acute posthemorrhagic anemia: Secondary | ICD-10-CM

## 2024-06-26 DIAGNOSIS — E139 Other specified diabetes mellitus without complications: Secondary | ICD-10-CM | POA: Diagnosis present

## 2024-06-26 DIAGNOSIS — D649 Anemia, unspecified: Principal | ICD-10-CM | POA: Diagnosis present

## 2024-06-26 DIAGNOSIS — I480 Paroxysmal atrial fibrillation: Secondary | ICD-10-CM | POA: Diagnosis present

## 2024-06-26 DIAGNOSIS — Z6834 Body mass index (BMI) 34.0-34.9, adult: Secondary | ICD-10-CM | POA: Insufficient documentation

## 2024-06-26 DIAGNOSIS — E66811 Obesity, class 1: Secondary | ICD-10-CM | POA: Insufficient documentation

## 2024-06-26 DIAGNOSIS — R002 Palpitations: Secondary | ICD-10-CM

## 2024-06-26 LAB — URINALYSIS, ROUTINE W REFLEX MICROSCOPIC
Bacteria, UA: NONE SEEN
Bilirubin Urine: NEGATIVE
Glucose, UA: NEGATIVE mg/dL
Ketones, ur: NEGATIVE mg/dL
Nitrite: NEGATIVE
Protein, ur: 100 mg/dL — AB
RBC / HPF: >50 RBC/hpf (ref 0–5)
Specific Gravity, Urine: 1.014 (ref 1.005–1.030)
WBC, UA: >50 WBC/hpf (ref 0–5)
pH: 9 — ABNORMAL HIGH (ref 5.0–8.0)

## 2024-06-26 LAB — COMPREHENSIVE METABOLIC PANEL WITH GFR
ALT: 21 U/L (ref 0–44)
AST: 22 U/L (ref 15–41)
Albumin: 3.5 g/dL (ref 3.5–5.0)
Alkaline Phosphatase: 82 U/L (ref 38–126)
Anion gap: 8 (ref 5–15)
BUN: 10 mg/dL (ref 6–20)
CO2: 23 mmol/L (ref 22–32)
Calcium: 8.5 mg/dL — ABNORMAL LOW (ref 8.9–10.3)
Chloride: 105 mmol/L (ref 98–111)
Creatinine, Ser: 0.99 mg/dL (ref 0.44–1.00)
GFR, Estimated: >60 mL/min (ref >60)
Glucose, Bld: 107 mg/dL — ABNORMAL HIGH (ref 70–99)
Potassium: 4.1 mmol/L (ref 3.5–5.1)
Sodium: 136 mmol/L (ref 135–145)
Total Bilirubin: 0.3 mg/dL (ref 0.0–1.2)
Total Protein: 7.1 g/dL (ref 6.5–8.1)

## 2024-06-26 LAB — SAMPLE TO BLOOD BANK

## 2024-06-26 LAB — ABO/RH: ABO/RH(D): A NEG

## 2024-06-26 LAB — CBC
HCT: 21.9 % — ABNORMAL LOW (ref 36.0–46.0)
Hemoglobin: 6.2 g/dL — CL (ref 12.0–15.0)
MCH: 18.7 pg — ABNORMAL LOW (ref 26.0–34.0)
MCHC: 28.3 g/dL — ABNORMAL LOW (ref 30.0–36.0)
MCV: 66.2 fL — ABNORMAL LOW (ref 80.0–100.0)
Platelets: 318 K/uL (ref 150–400)
RBC: 3.31 MIL/uL — ABNORMAL LOW (ref 3.87–5.11)
RDW: 17 % — ABNORMAL HIGH (ref 11.5–15.5)
WBC: 9.8 K/uL (ref 4.0–10.5)
nRBC: 0 % (ref 0.0–0.2)

## 2024-06-26 LAB — POCT URINE PREGNANCY: Preg Test, Ur: NEGATIVE

## 2024-06-26 LAB — LIPASE, BLOOD: Lipase: 29 U/L (ref 11–51)

## 2024-06-26 LAB — PREPARE RBC (CROSSMATCH)

## 2024-06-26 LAB — HCG, SERUM, QUALITATIVE: Preg, Serum: NEGATIVE

## 2024-06-26 MED ORDER — ESTROGENS CONJUGATED 25 MG IJ SOLR
25.0000 mg | Freq: Four times a day (QID) | INTRAMUSCULAR | Status: DC | PRN
  Administered 2024-06-26: 25 mg via INTRAVENOUS
  Filled 2024-06-26: qty 25

## 2024-06-26 MED ORDER — ONDANSETRON HCL 4 MG/2ML IJ SOLN: 4.0000 mg | Freq: Four times a day (QID) | INTRAMUSCULAR | Status: DC | PRN

## 2024-06-26 MED ORDER — METOPROLOL TARTRATE 25 MG PO TABS
25.0000 mg | ORAL_TABLET | Freq: Every evening | ORAL | Status: DC
  Administered 2024-06-26: 25 mg via ORAL
  Filled 2024-06-26: qty 1

## 2024-06-26 MED ORDER — SODIUM CHLORIDE 0.9% IV SOLUTION: Freq: Once | INTRAVENOUS | Status: DC

## 2024-06-26 MED ORDER — METOPROLOL TARTRATE 12.5 MG HALF TABLET
12.5000 mg | ORAL_TABLET | Freq: Every morning | ORAL | Status: DC
  Administered 2024-06-27: 12.5 mg via ORAL
  Filled 2024-06-26: qty 1

## 2024-06-26 MED ORDER — POLYETHYLENE GLYCOL 3350 17 G PO PACK: 17.0000 g | PACK | Freq: Every day | ORAL | Status: DC | PRN

## 2024-06-26 MED ORDER — MEGESTROL ACETATE 40 MG PO TABS
120.0000 mg | ORAL_TABLET | Freq: Every day | ORAL | Status: DC
  Administered 2024-06-27: 120 mg via ORAL
  Filled 2024-06-26: qty 3

## 2024-06-26 MED ORDER — MELATONIN 3 MG PO TABS: 6.0000 mg | ORAL_TABLET | Freq: Every evening | ORAL | Status: DC | PRN

## 2024-06-26 MED ORDER — SODIUM CHLORIDE 0.9% IV SOLUTION: Freq: Once | INTRAVENOUS | Status: AC

## 2024-06-26 MED ORDER — STERILE WATER FOR INJECTION IJ SOLN
INTRAMUSCULAR | Status: AC
  Filled 2024-06-26: qty 10

## 2024-06-26 MED ORDER — SODIUM CHLORIDE 0.9% FLUSH
3.0000 mL | Freq: Two times a day (BID) | INTRAVENOUS | Status: DC
  Administered 2024-06-26 – 2024-06-27 (×2): 3 mL via INTRAVENOUS

## 2024-06-26 MED ORDER — METHIMAZOLE 10 MG PO TABS
10.0000 mg | ORAL_TABLET | Freq: Three times a day (TID) | ORAL | Status: DC
  Administered 2024-06-26 – 2024-06-27 (×2): 10 mg via ORAL
  Filled 2024-06-26 (×4): qty 1

## 2024-06-26 MED ORDER — ATORVASTATIN CALCIUM 10 MG PO TABS
20.0000 mg | ORAL_TABLET | Freq: Every day | ORAL | Status: DC
  Administered 2024-06-27: 20 mg via ORAL
  Filled 2024-06-26: qty 2

## 2024-06-26 MED ORDER — MEGESTROL ACETATE 40 MG PO TABS
120.0000 mg | ORAL_TABLET | Freq: Once | ORAL | Status: AC
  Administered 2024-06-26: 120 mg via ORAL
  Filled 2024-06-26: qty 3

## 2024-06-26 MED ORDER — ACETAMINOPHEN 500 MG PO TABS
1000.0000 mg | ORAL_TABLET | Freq: Four times a day (QID) | ORAL | Status: DC | PRN
  Administered 2024-06-27: 1000 mg via ORAL
  Filled 2024-06-26: qty 2

## 2024-06-26 MED ORDER — ALBUTEROL SULFATE (2.5 MG/3ML) 0.083% IN NEBU: 2.5000 mg | INHALATION_SOLUTION | RESPIRATORY_TRACT | Status: DC | PRN

## 2024-06-26 NOTE — Consult Note (Signed)
 GYN Note  Dr Randol called me at 1924 regarding management of this patient on DOAC chronically for A fib who is chronically anemic who has been bleeding for 12-14 days presented with weakness, hemoglobin 6.2, down from 8.9 3 months earlier  Pap 12/2023 normal Had normal pelvic exam at that time by Rosaline Pouch [facility]  Being transfused currently  Dr Randol called for management advise: >transfuse to get Hgb 9 >Premarin  25 mg IV q6h x 4 or until bleeding stops >megestrol  120 mg now and daily >continue megestrol  120 mg daily as an outpatient >needs to be seen in [facility] GYN office in 2 weeks for chronic management: recommend Mirena IUD as ideal and add oral progesterone if needed >pelvic sonogram to evaluate for uterine pathology  Vonn VEAR Inch, MD 06/26/2024 7:38 PM

## 2024-06-26 NOTE — ED Triage Notes (Signed)
 The pt has had some vaginal bleeding for 18 days    she is having abd pain also  lmp now

## 2024-06-26 NOTE — ED Provider Notes (Signed)
 [facility]    CSN: [id number] Arrival date & time: 06/26/24  1429      History   Chief Complaint Chief Complaint  Patient presents with   Vaginal Bleeding   Back Pain    HPI Susan [name] is a 48 y.o. female.   Patient presented with concerns for vaginal bleeding that has been going on for 13 days.  Patient reports that she has had heavy vaginal bleeding consistently for the last 13 days and states that she has had a large amount of clots with the bleeding as well.    Patient states that she is going through a very thick menstrual pad every 30 minutes and sometimes even earlier than 30 minutes due to the bleeding.  Patient states that the bleeding does worsen when she is walking.  Patient reports that a few days ago she did have some significant abdominal cramping that has since subsided but continues to bleed in large amounts.  Patient is on Eliquis .  Patient has been taking this for about a year for A-fib.  Patient states that she has had some palpitations, shortness of breath, and fatigue that began on 7/18.  The history is provided by the patient and medical records. The history is limited by a language barrier. A language interpreter was used (Spanish interpreter).  Vaginal Bleeding Associated symptoms: back pain   Back Pain   Past Medical History:  Diagnosis Date   Atrial fibrillation (HCC)    Diabetes 1.5, managed as type 2 (HCC)    DM (diabetes mellitus) (HCC)    Hypertension    Hyperthyroidism     Patient Active Problem List   Diagnosis Date Noted   Paroxysmal atrial fibrillation (HCC) 12/24/2023   Hypercoagulable state due to paroxysmal atrial fibrillation (HCC) 07/15/2023   Atrial fibrillation with rapid ventricular response (HCC) 06/19/2023   Diabetes mellitus type 1.5 (HCC) 06/19/2023   HTN (hypertension) 06/19/2023   Screening breast examination 11/10/2019    History reviewed. No pertinent surgical history.  OB History      Gravida  5   Para  4   Term  4   Preterm      AB  1   Living  4      SAB      IAB  1   Ectopic      Multiple      Live Births               Home Medications    Prior to Admission medications   Medication Sig Start Date End Date Taking? Authorizing Provider  apixaban  (ELIQUIS ) 5 MG TABS tablet Take 1 tablet (5 mg total) by mouth 2 (two) times daily. 12/24/23   Terra Fairy PARAS, PA-C  atorvastatin  (LIPITOR) 20 MG tablet Take 1 tablet (20 mg total) by mouth daily. 05/18/24   Celestia Rosaline SQUIBB, NP  ibuprofen  (ADVIL ) 600 MG tablet Take 1 tablet (600 mg total) by mouth every 8 (eight) hours as needed. Patient taking differently: Take 600 mg by mouth as needed. 04/04/24   Celestia Rosaline SQUIBB, NP  lisinopril  (ZESTRIL ) 5 MG tablet Take 1 tablet (5 mg total) by mouth daily. 05/27/24   Celestia Rosaline SQUIBB, NP  metFORMIN  (GLUCOPHAGE -XR) 500 MG 24 hr tablet Take 1 tablet (500 mg total) by mouth 2 (two) times daily. 04/04/24   Celestia Rosaline SQUIBB, NP  methimazole  (TAPAZOLE ) 10 MG tablet Take 1 tablet (10 mg total) by mouth 3 (three) times daily.  06/20/24   Motwani, Komal, MD  metoprolol  tartrate (LOPRESSOR ) 25 MG tablet Take 0.5 tablets (12.5 mg total) by mouth in the morning AND 1 tablet (25 mg total) every evening. 12/24/23   Terra Fairy PARAS, PA-C    Family History Family History  Problem Relation Age of Onset   Diabetes Mother    Diabetes Father    Hypertension Father    Diabetes Sister    Diabetes Sister    Breast cancer Neg Hx     Social History Social History   Tobacco Use   Smoking status: Never   Smokeless tobacco: Never  Vaping Use   Vaping status: Never Used  Substance Use Topics   Alcohol use: No   Drug use: No     Allergies   Patient has no known allergies.   Review of Systems Review of Systems  Genitourinary:  Positive for vaginal bleeding.  Musculoskeletal:  Positive for back pain.   Per HPI  Physical Exam Triage Vital Signs ED Triage  Vitals  Encounter Vitals Group     BP 06/26/24 1531 124/72     Girls Systolic BP Percentile --      Girls Diastolic BP Percentile --      Boys Systolic BP Percentile --      Boys Diastolic BP Percentile --      Pulse Rate 06/26/24 1531 81     Resp 06/26/24 1531 16     Temp 06/26/24 1531 99 F (37.2 C)     Temp Source 06/26/24 1531 Oral     SpO2 06/26/24 1531 98 %     Weight --      Height --      Head Circumference --      Peak Flow --      Pain Score 06/26/24 1535 8     Pain Loc --      Pain Education --      Exclude from Growth Chart --    No data found.  Updated Vital Signs BP 124/72 (BP Location: Right Arm)   Pulse 81   Temp 99 F (37.2 C) (Oral)   Resp 16   LMP 06/13/2024 (Approximate)   SpO2 98%   Visual Acuity Right Eye Distance:   Left Eye Distance:   Bilateral Distance:    Right Eye Near:   Left Eye Near:    Bilateral Near:     Physical Exam Vitals and nursing note reviewed.  Constitutional:      General: She is awake. She is not in acute distress.    Appearance: Normal appearance. She is well-developed and well-groomed. She is not ill-appearing.  Cardiovascular:     Rate and Rhythm: Normal rate and regular rhythm.  Pulmonary:     Effort: Pulmonary effort is normal.     Breath sounds: Normal breath sounds.  Abdominal:     General: Abdomen is protuberant. Bowel sounds are normal.     Palpations: Abdomen is soft.     Tenderness: There is no abdominal tenderness.  Skin:    General: Skin is warm and dry.  Neurological:     General: No focal deficit present.     Mental Status: She is alert and oriented to person, place, and time. Mental status is at baseline.  Psychiatric:        Behavior: Behavior is cooperative.      UC Treatments / Results  Labs (all labs ordered are listed, but only abnormal results are displayed) Labs Reviewed  POCT URINE PREGNANCY - Normal    EKG   Radiology No results found.  Procedures Procedures (including  critical care time)  Medications Ordered in UC Medications - No data to display  Initial Impression / Assessment and Plan / UC Course  I have reviewed the triage vital signs and the nursing notes.  Pertinent labs & imaging results that were available during my care of the patient were reviewed by me and considered in my medical decision making (see chart for details).     Patient is overall well-appearing.  Vitals are stable.  Abdomen is soft and nontender.  EKG revealed normal sinus rhythm without any evidence of A-fib at this time.  Urine pregnancy was negative.  Recommended the patient be seen in the emergency department for further evaluation due to reported heavy persistent bleeding, currently taking Eliquis , and is now symptomatic for acute blood loss.  Patient is agreeable to plan at this time.  Patient is stable to arrive to the ER via POV at this time. Final Clinical Impressions(s) / UC Diagnoses   Final diagnoses:  Vaginal bleeding  Dizziness  Palpitations     Discharge Instructions      Please go to the emergency department for further evaluation of your excessive vaginal bleeding while on a blood thinner.  Por favor, acuda al departamento de emergencias para una evaluacin ms detallada de su sangrado vaginal excesivo mientras toma anticoagulantes.   ED Prescriptions   None    PDMP not reviewed this encounter.   Johnie Flaming A, NP 06/26/24 [phone]

## 2024-06-26 NOTE — ED Notes (Signed)
 Patient is being discharged from the Urgent Care and sent to the Emergency Department via POV. Per Rumaldo Ryder, NP, patient is in need of higher level of care due to vaginal bleeding on Eliquis . Patient is aware and verbalizes understanding of plan of care.  Vitals:   06/26/24 1531  BP: 124/72  Pulse: 81  Resp: 16  Temp: 99 F (37.2 C)  SpO2: 98%

## 2024-06-26 NOTE — Discharge Instructions (Signed)
 Please go to the emergency department for further evaluation of your excessive vaginal bleeding while on a blood thinner.  Por favor, acuda al departamento de emergencias para una evaluacin ms detallada de su sangrado vaginal excesivo mientras toma anticoagulantes.

## 2024-06-26 NOTE — ED Triage Notes (Signed)
 Patient here today with c/o vaginal bleeding X 13 days. Patient has also been having some back pain when she takes deep breathes and fatigue since Friday. She has also noticed some rapid heartbeat. Patient takes Eliquis .

## 2024-06-26 NOTE — ED Provider Notes (Signed)
 [place] [facility] Provider Note   CSN: [id number] Arrival date & time: 06/26/24  1618     Patient presents with: Vaginal Bleeding   Susan [name] is a 48 y.o. female.  {Add pertinent medical, surgical, social history, OB history to YEP:67052}  Vaginal Bleeding    PT STATES she has been having a lot of vaginal bleeding.  She has felt fatigued and tired. She feels short of breath when she walks.   She has been bleeding for 12 to 14 days.  She had some discomfort in her back yesterday but none today.  Pt is on anticoagulation for irregular  heart rhytjm.  No hx pe or dvt  Prior to Admission medications   Medication Sig Start Date End Date Taking? Authorizing Provider  apixaban  (ELIQUIS ) 5 MG TABS tablet Take 1 tablet (5 mg total) by mouth 2 (two) times daily. 12/24/23   Terra Fairy PARAS, PA-C  atorvastatin  (LIPITOR) 20 MG tablet Take 1 tablet (20 mg total) by mouth daily. 05/18/24   Celestia Rosaline SQUIBB, NP  ibuprofen  (ADVIL ) 600 MG tablet Take 1 tablet (600 mg total) by mouth every 8 (eight) hours as needed. Patient taking differently: Take 600 mg by mouth as needed. 04/04/24   Celestia Rosaline SQUIBB, NP  lisinopril  (ZESTRIL ) 5 MG tablet Take 1 tablet (5 mg total) by mouth daily. 05/27/24   Celestia Rosaline SQUIBB, NP  metFORMIN  (GLUCOPHAGE -XR) 500 MG 24 hr tablet Take 1 tablet (500 mg total) by mouth 2 (two) times daily. 04/04/24   Celestia Rosaline SQUIBB, NP  methimazole  (TAPAZOLE ) 10 MG tablet Take 1 tablet (10 mg total) by mouth 3 (three) times daily. 06/20/24   Motwani, Komal, MD  metoprolol  tartrate (LOPRESSOR ) 25 MG tablet Take 0.5 tablets (12.5 mg total) by mouth in the morning AND 1 tablet (25 mg total) every evening. 12/24/23   Terra Fairy PARAS, PA-C    Allergies: Patient has no known allergies.    Review of Systems  Genitourinary:  Positive for vaginal bleeding.    Updated Vital Signs BP 135/62   Pulse 81   Temp 98.8 F (37.1 C)  (Oral)   Resp 16   Ht 1.448 m (4' 9)   Wt 72.2 kg   LMP 06/26/2024 (Approximate)   SpO2 100%   BMI 34.44 kg/m   Physical Exam Vitals and nursing note reviewed.  Constitutional:      General: She is not in acute distress.    Appearance: She is well-developed.  HENT:     Head: Normocephalic and atraumatic.     Right Ear: External ear normal.     Left Ear: External ear normal.  Eyes:     General: No scleral icterus.       Right eye: No discharge.        Left eye: No discharge.     Conjunctiva/sclera: Conjunctivae normal.  Neck:     Trachea: No tracheal deviation.  Cardiovascular:     Rate and Rhythm: Normal rate and regular rhythm.  Pulmonary:     Effort: Pulmonary effort is normal. No respiratory distress.     Breath sounds: Normal breath sounds. No stridor. No wheezing or rales.  Abdominal:     General: Bowel sounds are normal. There is no distension.     Palpations: Abdomen is soft.     Tenderness: There is no abdominal tenderness. There is no guarding or rebound.  Musculoskeletal:  General: No tenderness or deformity.     Cervical back: Neck supple.  Skin:    General: Skin is warm and dry.     Findings: No rash.  Neurological:     General: No focal deficit present.     Mental Status: She is alert.     Cranial Nerves: No cranial nerve deficit, dysarthria or facial asymmetry.     Sensory: No sensory deficit.     Motor: No abnormal muscle tone or seizure activity.     Coordination: Coordination normal.  Psychiatric:        Mood and Affect: Mood normal.     (all labs ordered are listed, but only abnormal results are displayed) Labs Reviewed  CBC - Abnormal; Notable for the following components:      Result Value   RBC 3.31 (*)    Hemoglobin 6.2 (*)    HCT 21.9 (*)    MCV 66.2 (*)    MCH 18.7 (*)    MCHC 28.3 (*)    RDW 17.0 (*)    All other components within normal limits  URINALYSIS, ROUTINE W REFLEX MICROSCOPIC - Abnormal; Notable for the  following components:   Color, Urine RED (*)    APPearance CLOUDY (*)    pH 9.0 (*)    Hgb urine dipstick LARGE (*)    Protein, ur 100 (*)    Leukocytes,Ua TRACE (*)    All other components within normal limits  HCG, SERUM, QUALITATIVE  LIPASE, BLOOD  COMPREHENSIVE METABOLIC PANEL WITH GFR  SAMPLE TO BLOOD BANK    EKG: None  Radiology: No results found.  {Document cardiac monitor, telemetry assessment procedure when appropriate:32947} .Critical Care  Performed by: Randol Simmonds, MD Authorized by: Randol Simmonds, MD   .Critical Care  Performed by: Randol Simmonds, MD Authorized by: Randol Simmonds, MD   Critical care provider statement:    Critical care time (minutes):  30   Critical care was time spent personally by me on the following activities:  Development of treatment plan with patient or surrogate, discussions with consultants, evaluation of patient's response to treatment, examination of patient, ordering and review of laboratory studies, ordering and review of radiographic studies, ordering and performing treatments and interventions, pulse oximetry, re-evaluation of patient's condition and review of old charts    Medications Ordered in the ED - No data to display  Clinical Course as of 06/26/24 1951  Sun Jun 26, 2024  1926 Discussed with Dr Jayne.  25 mg IV permarin q6 for 4 doses.  And also 120 megesterol po now.  Would recommend 2-3 units.   [JK]  1951 Case discussed with Dr. Keturah regarding admission [JK]    Clinical Course User Index [JK] Randol Simmonds, MD   {Click here for ABCD2, HEART and other calculators REFRESH Note before signing:1}                              Medical Decision Making Amount and/or Complexity of Data Reviewed Labs: ordered. Radiology: ordered.  Risk Prescription drug management. Decision regarding hospitalization.   ***  {Document critical care time when appropriate  Document review of labs and clinical decision tools ie CHADS2VASC2, etc   Document your independent review of radiology images and any outside records  Document your discussion with family members, caretakers and with consultants  Document social determinants of health affecting pt's care  Document your decision making why or why not admission,  treatments were needed:32947:::1}   Final diagnoses:  None    ED Discharge Orders     None

## 2024-06-26 NOTE — H&P (Addendum)
 History and Physical    Susan [name] FMW:[id number] DOB: 03/05/1976 DOA: 06/26/2024  PCP: Celestia Rosaline SQUIBB, NP   Patient coming from: Home   Chief Complaint:  Chief Complaint  Patient presents with   Vaginal Bleeding    HPI:  Susan [name] is a 48 y.o. female with hx of Paroxysmal afib on AC, Hyperthyroidism, HTN, DM, HLD, who presents with heavy vaginal bleeding, lightheadedness and SOB. Reports that typically she has regular periods which last about 2-5 days without heavy bleeding. This period has been ongoing for about 12-13 days with much heavier bleeding, requiring her to change pads every 30 min - 1 hr. Passing large blood clots. She had been on Apixaban  but spoke with her outpatient doctor who recommended holding, so has been off since Fri 7/18. She does use occasional NSAID but strictly avoids during any menstrual period due to risk of bleeding, no recent use.    Review of Systems:  ROS complete and negative except as marked above   No Known Allergies  Prior to Admission medications   Medication Sig Start Date End Date Taking? Authorizing Provider  apixaban  (ELIQUIS ) 5 MG TABS tablet Take 1 tablet (5 mg total) by mouth 2 (two) times daily. 12/24/23   Terra Fairy PARAS, PA-C  atorvastatin  (LIPITOR) 20 MG tablet Take 1 tablet (20 mg total) by mouth daily. 05/18/24   Celestia Rosaline SQUIBB, NP  ibuprofen  (ADVIL ) 600 MG tablet Take 1 tablet (600 mg total) by mouth every 8 (eight) hours as needed. Patient taking differently: Take 600 mg by mouth as needed. 04/04/24   Celestia Rosaline SQUIBB, NP  lisinopril  (ZESTRIL ) 5 MG tablet Take 1 tablet (5 mg total) by mouth daily. 05/27/24   Celestia Rosaline SQUIBB, NP  metFORMIN  (GLUCOPHAGE -XR) 500 MG 24 hr tablet Take 1 tablet (500 mg total) by mouth 2 (two) times daily. 04/04/24   Celestia Rosaline SQUIBB, NP  methimazole  (TAPAZOLE ) 10 MG tablet Take 1 tablet (10 mg total) by mouth 3 (three) times daily. 06/20/24   Motwani,  Komal, MD  metoprolol  tartrate (LOPRESSOR ) 25 MG tablet Take 0.5 tablets (12.5 mg total) by mouth in the morning AND 1 tablet (25 mg total) every evening. 12/24/23   Terra Fairy PARAS, PA-C    Past Medical History:  Diagnosis Date   Atrial fibrillation [facility])    Diabetes 1.5, managed as type 2 (HCC)    DM (diabetes mellitus) (HCC)    Hypertension    Hyperthyroidism     History reviewed. No pertinent surgical history.   reports that she has never smoked. She has never used smokeless tobacco. She reports that she does not drink alcohol and does not use drugs.  Family History  Problem Relation Age of Onset   Diabetes Mother    Diabetes Father    Hypertension Father    Diabetes Sister    Diabetes Sister    Breast cancer Neg Hx      Physical Exam: Vitals:   06/26/24 1629 06/26/24 1845  BP: 135/62 (!) 101/52  Pulse: 81 86  Resp: 16   Temp: 98.8 F (37.1 C)   TempSrc: Oral   SpO2: 100% 100%  Weight: 72.2 kg   Height: 4' 9 (1.448 m)     Gen: Awake, alert, NAD   CV: Regular, normal S1, S2, 2/6 SEM  Resp: Normal WOB, CTAB  Abd: Flat, normoactive, nontender MSK: Symmetric, no edema  Skin: No rashes or lesions to exposed skin  Neuro: Alert  and interactive  Psych: euthymic, appropriate    Data review:   Labs reviewed, notable for:   Hb 6.2, microcytic, hypochromic with wide RDW. PLT wnl  Rcnt TSH < 0.1 , ft4, t3 wnl   Micro:  Results for orders placed or performed in visit on 06/07/24  Urine Culture     Status: None   Collection Time: 06/07/24 10:40 AM   Specimen: Urine   Urine  Result Value Ref Range Status   Urine Culture, Routine Final report  Final   Organism ID, Bacteria Comment  Final    Comment: Culture shows less than 10,000 colony forming units of bacteria per milliliter of urine. This colony count is not generally considered to be clinically significant.     Imaging reviewed:  No results found.   ED Course:  Gyn consulted re: [facility], Dr. Jayne  recommending for transfusion target Hb 9, Premarin  q6 until bleeding stops, megace  daily, pelvic US  and f/u gyn OP.    Assessment/Plan:  48 y.o. female with hx Paroxysmal afib on AC, Hyperthyroidism, HTN, DM, HLD, who presents with heavy vaginal bleeding, lightheadedness and SOB. Found to have symptomatic anemia from [facility]    Heavy menstrual bleeding  Symptomatic anemia, microcytic  Last Hb 8.9 in 4/'25; note higher baseline prior to starting [facility] ~ 7/'24 was previously ~ 10-11. Down to 6.2 on admission, MCV 66. - Gyn consulted re: [facility], Dr. Jayne recommendations for transfusion target Hb 9, Premarin  25 mg IV q6 x4 or until bleeding stops, megace  120 mg daily to continue outpatient, pelvic US  and f/u gyn OP within 2 weeks.  - Ordered for 2U RBC for now, repeat H/H following; addl transfusion as needed  - Check Iron panel, anticipate likely IDA and need for iron supplementation  - Hold Apixaban , would continue hold few days after bleeding stops. Then can resume as Rx'd   Chronic medical problems:  Paroxysmal Afib: Holding AC per above, Continue home Metoprolol   Hyperthyroidism: Folllowed by Endo OP, Continue Methimazole  10 mg TID HTN: Hold home Lisinopril   DM: Holding Metformin , not currently requiring SSI  HLD: Continue home Atorvastatin     Body mass index is 34.44 kg/m. Obesity class I would benefit from weight loss outpatient    DVT prophylaxis:  SCDs Code Status:  Full Code Diet:  Diet Orders (From admission, onward)     Start     Ordered   06/26/24 2017  Diet regular Room service appropriate? Yes; Fluid consistency: Thin  Diet effective now       Question Answer Comment  Room service appropriate? Yes   Fluid consistency: Thin      06/26/24 2017           Family Communication:  Yes discussed with family at bedside   Consults:  Gyn   Admission status:   Observation, Telemetry bed  Severity of Illness: The appropriate patient status for this patient is OBSERVATION.  Observation status is judged to be reasonable and necessary in order to provide the required intensity of service to ensure the patient's safety. The patient's presenting symptoms, physical exam findings, and initial radiographic and laboratory data in the context of their medical condition is felt to place them at decreased risk for further clinical deterioration. Furthermore, it is anticipated that the patient will be medically stable for discharge from the hospital within 2 midnights of admission.    Dorn Dawson, MD [facility]  How to contact the [facility] Attending or Consulting provider 7A - 7P or  covering provider during after hours 7P -7A, for this patient.  Check the care team in [facility] and look for a) attending/consulting [facility] provider listed and b) the [facility] team listed Log into [email or web address] and use [place] universal password to access. If you do not have the password, please contact the hospital operator. Locate the [facility] provider you are looking for under [facility] and page to a number that you can be directly reached. If you still have difficulty reaching the provider, please page the [facility] (Director on Call) for the Hospitalists listed on amion for assistance.  06/26/2024, 9:06 PM

## 2024-06-27 ENCOUNTER — Other Ambulatory Visit (HOSPITAL_COMMUNITY): Payer: Self-pay

## 2024-06-27 ENCOUNTER — Encounter (HOSPITAL_COMMUNITY): Payer: Self-pay | Admitting: Internal Medicine

## 2024-06-27 DIAGNOSIS — N921 Excessive and frequent menstruation with irregular cycle: Secondary | ICD-10-CM

## 2024-06-27 DIAGNOSIS — E66811 Obesity, class 1: Secondary | ICD-10-CM | POA: Insufficient documentation

## 2024-06-27 DIAGNOSIS — E139 Other specified diabetes mellitus without complications: Secondary | ICD-10-CM

## 2024-06-27 DIAGNOSIS — I480 Paroxysmal atrial fibrillation: Secondary | ICD-10-CM

## 2024-06-27 DIAGNOSIS — I10 Essential (primary) hypertension: Secondary | ICD-10-CM

## 2024-06-27 LAB — BASIC METABOLIC PANEL WITH GFR
Anion gap: 10 (ref 5–15)
BUN: 8 mg/dL (ref 6–20)
CO2: 22 mmol/L (ref 22–32)
Calcium: 8.4 mg/dL — ABNORMAL LOW (ref 8.9–10.3)
Chloride: 103 mmol/L (ref 98–111)
Creatinine, Ser: 0.61 mg/dL (ref 0.44–1.00)
GFR, Estimated: >60 mL/min (ref >60)
Glucose, Bld: 177 mg/dL — ABNORMAL HIGH (ref 70–99)
Potassium: 3.6 mmol/L (ref 3.5–5.1)
Sodium: 135 mmol/L (ref 135–145)

## 2024-06-27 LAB — CBC
HCT: 27.8 % — ABNORMAL LOW (ref 36.0–46.0)
Hemoglobin: 8.8 g/dL — ABNORMAL LOW (ref 12.0–15.0)
MCH: 21.9 pg — ABNORMAL LOW (ref 26.0–34.0)
MCHC: 31.7 g/dL (ref 30.0–36.0)
MCV: 69.3 fL — ABNORMAL LOW (ref 80.0–100.0)
Platelets: 304 K/uL (ref 150–400)
RBC: 4.01 MIL/uL (ref 3.87–5.11)
RDW: 20.6 % — ABNORMAL HIGH (ref 11.5–15.5)
WBC: 11 K/uL — ABNORMAL HIGH (ref 4.0–10.5)
nRBC: 0 % (ref 0.0–0.2)

## 2024-06-27 LAB — IRON AND TIBC
Iron: 364 ug/dL — ABNORMAL HIGH (ref 28–170)
Saturation Ratios: 83 % — ABNORMAL HIGH (ref 10.4–31.8)
TIBC: 441 ug/dL (ref 250–450)
UIBC: 77 ug/dL

## 2024-06-27 LAB — HIV ANTIBODY (ROUTINE TESTING W REFLEX): HIV Screen 4th Generation wRfx: NONREACTIVE

## 2024-06-27 LAB — TRANSFERRIN: Transferrin: 314 mg/dL (ref 192–382)

## 2024-06-27 LAB — PHOSPHORUS: Phosphorus: 2.7 mg/dL (ref 2.5–4.6)

## 2024-06-27 LAB — FERRITIN: Ferritin: 7 ng/mL — ABNORMAL LOW (ref 11–307)

## 2024-06-27 LAB — MAGNESIUM: Magnesium: 2 mg/dL (ref 1.7–2.4)

## 2024-06-27 MED ORDER — MEGESTROL ACETATE 40 MG PO TABS
120.0000 mg | ORAL_TABLET | Freq: Every day | ORAL | 0 refills | Status: DC
  Filled 2024-06-27: qty 90, 30d supply, fill #0

## 2024-06-27 NOTE — Hospital Course (Signed)
 HPI: Susan [name] is a 48 y.o. female with hx of Paroxysmal afib on AC, Hyperthyroidism, HTN, DM, HLD, who presents with heavy vaginal bleeding, lightheadedness and SOB. Reports that typically she has regular periods which last about 2-5 days without heavy bleeding. This period has been ongoing for about 12-13 days with much heavier bleeding, requiring her to change pads every 30 min - 1 hr. Passing large blood clots. She had been on Apixaban  but spoke with her outpatient doctor who recommended holding, so has been off since Fri 7/18. She does use occasional NSAID but strictly avoids during any menstrual period due to risk of bleeding, no recent use.   Significant Events: Admitted 06/26/2024 for symptomatic anemia 06-26-2024 transfused 2 units PRBC  Admission Labs: WBC 9.8, HgB 6.2, plt 318 Na 136, K 4.1, CO2 of 23, BUN 10, Scr 0.99, glu 107 T prot 7.1, alb 3.5, AST 22, ALT 21, alk phos 82, T. Bili 0.3 Lipase 29 Urine pregnancy test Negative  Admission Imaging Studies: Pelvis U/S Heterogeneous uterine fibroids. 2. Bilateral ovarian cysts, as described above. Correlation with 6-12 week follow-up pelvic ultrasound is recommended to determine stability.  Significant Labs:   Significant Imaging Studies:   Antibiotic Therapy: Anti-infectives (From admission, onward)    None       Procedures:   Consultants: OB/BYN

## 2024-06-27 NOTE — Assessment & Plan Note (Signed)
 06-27-2024 Body mass index is 34.44 kg/m.

## 2024-06-27 NOTE — Progress Notes (Signed)
 MATCH Medication Assistance Card *Pharmacies please call [phone] for claim processing assistance Name: Susan [name]                                                                                                                                                                                    Relationship Code:  1 ID (MRN): [id number]                                                                                                                                                                              Person Code:  01 Bin: [id number] RX Group: [id number] Discharge Date: 06/27/2024 RX PCN:  [id number] Expiration Date:07/05/2024                                           (must be filled within 7 days of discharge)     You have been approved to have the prescriptions written by your discharging physician filled through our [facility] (Medication Assistance Through [facility]) program. This program allows for a one-time (no refills) 34-day supply of selected medications for a low copay amount.  The copay is $0 per prescription.   Only certain pharmacies are participating in this program with [facility]. You will need to select one of the pharmacies from the attached list and take your prescriptions, this letter, and your photo ID to one of  the [facility] Outpatient pharmacies.  We are excited that you are able to use the [facility] program to get your medications. These prescriptions must be filled within 7 days of hospital discharge or they will no longer be valid for the [facility] program. Should you have any problems with your prescriptions please contact your case management team member at [phone] for [place]  [facility].  Thank you,   [facility]

## 2024-06-27 NOTE — Subjective & Objective (Signed)
 Pt seen and examined. Used video interpretor anabel [id number] Minimal vaginal bleeding now. No abd pain GYN consult reviewed.

## 2024-06-27 NOTE — Assessment & Plan Note (Addendum)
 06-27-2024 s/p 2 unit PRBC transfusion. Awaiting repeat CBC.  *update. Repeat HgB after 2 units PRBC transfusion is 8.8 g/dl. Unfortunately, iron studies not done on blood prior to transfusion. Iron profile was performed AFTER PRBC transfusion.

## 2024-06-27 NOTE — Plan of Care (Signed)
  Problem: Education: Goal: Knowledge of General Education information will improve Description: Including pain rating scale, medication(s)/side effects and non-pharmacologic comfort measures Outcome: Not Progressing   Problem: Health Behavior/Discharge Planning: Goal: Ability to manage health-related needs will improve Outcome: Not Progressing   Problem: Clinical Measurements: Goal: Ability to maintain clinical measurements within normal limits will improve Outcome: Not Progressing   Problem: Clinical Measurements: Goal: Will remain free from infection Outcome: Not Progressing   Problem: Clinical Measurements: Goal: Diagnostic test results will improve Outcome: Not Progressing

## 2024-06-27 NOTE — Progress Notes (Signed)
   06/27/24 1057  Spiritual Encounters  Type of Visit Initial  Care provided to: Patient  Conversation partners present during encounter Other (comment) Garment/textile technologist)  Reason for visit Advance directives  OnCall Visit No   Using an interpreter, Chaplain provided AD documentation and education to the Patient. Patient plans to complete AD documentation naming her son as her HCPOA and will reach out to chaplain office when she has done so. No further spiritual need at this time.  Chaplain Therisa Samuel

## 2024-06-27 NOTE — Progress Notes (Signed)
 PROGRESS NOTE    Susan [name]  FMW:[id number] DOB: [birth year] DOA: 06/26/2024 PCP: Celestia Rosaline SQUIBB, NP  Subjective: Pt seen and examined. Used video interpretor anabel [id number] Minimal vaginal bleeding now. No abd pain GYN consult reviewed.   Hospital Course: HPI: Susan [name] is a 48 y.o. female with hx of Paroxysmal afib on AC, Hyperthyroidism, HTN, DM, HLD, who presents with heavy vaginal bleeding, lightheadedness and SOB. Reports that typically she has regular periods which last about 2-5 days without heavy bleeding. This period has been ongoing for about 12-13 days with much heavier bleeding, requiring her to change pads every 30 min - 1 hr. Passing large blood clots. She had been on Apixaban  but spoke with her outpatient doctor who recommended holding, so has been off since Fri 7/18. She does use occasional NSAID but strictly avoids during any menstrual period due to risk of bleeding, no recent use.   Significant Events: Admitted 06/26/2024 for symptomatic anemia 06-26-2024 transfused 2 units PRBC  Admission Labs: WBC 9.8, HgB 6.2, plt 318 Na 136, K 4.1, CO2 of 23, BUN 10, Scr 0.99, glu 107 T prot 7.1, alb 3.5, AST 22, ALT 21, alk phos 82, T. Bili 0.3 Lipase 29 Urine pregnancy test Negative  Admission Imaging Studies: Pelvis U/S Heterogeneous uterine fibroids. 2. Bilateral ovarian cysts, as described above. Correlation with 6-12 week follow-up pelvic ultrasound is recommended to determine stability.  Significant Labs:   Significant Imaging Studies:   Antibiotic Therapy: Anti-infectives (From admission, onward)    None       Procedures:   Consultants: OB/BYN    Assessment and Plan: * Symptomatic anemia 06-27-2024 s/p 2 unit PRBC transfusion. Awaiting repeat CBC.  Heavy menstrual bleeding 06-27-2024 GYN consult reviewed. Vaginal bleeding already improved after 1 dose IV premarin . She will continue megace  at discharge. F/u  with OB/GYN. She will need to hold Eliquis  until her vaginal bleeding issues are addressed in the GYN office.  Paroxysmal atrial fibrillation (HCC) 06-27-2024 continue lopressor . Holding Eliquis  until her vaginal bleeding issues are addressed in the GYN office.   Essential hypertension 06-27-2024 continue lopressor .   Obesity, Class I, BMI 30-34.9 06-27-2024 Body mass index is 34.44 kg/m.   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   DVT prophylaxis: Place and maintain sequential compression device Start: 06/27/24 0857     Code Status: Full Code Family Communication: no family at bedside. Pt is decisional. Disposition Plan: return home Reason for continuing need for hospitalization: possible DC today.  Objective: Vitals:   06/27/24 0244 06/27/24 0308 06/27/24 0615 06/27/24 0747  BP: (!) 104/50 (!) 92/46 (!) 114/95 112/60  Pulse: 68 74 69 67  Resp: 18 18 16 17   Temp: 98.2 F (36.8 C) 98.3 F (36.8 C) 98.4 F (36.9 C) 98.8 F (37.1 C)  TempSrc: Oral Oral    SpO2: 99% 99%  99%  Weight:      Height:        Intake/Output Summary (Last 24 hours) at 06/27/2024 0856 Last data filed at 06/27/2024 0615 Gross per 24 hour  Intake 991.5 ml  Output --  Net 991.5 ml   Filed Weights   06/26/24 1629  Weight: 72.2 kg    Examination:  Physical Exam Vitals and nursing note reviewed.  Constitutional:      General: She is not in acute distress.    Appearance: She is obese. She is not ill-appearing, toxic-appearing or diaphoretic.  HENT:     Head: Normocephalic and  atraumatic.  Eyes:     General: No scleral icterus. Cardiovascular:     Rate and Rhythm: Normal rate and regular rhythm.  Pulmonary:     Effort: Pulmonary effort is normal.     Breath sounds: Normal breath sounds.  Abdominal:     General: Bowel sounds are normal. There is no distension.     Palpations: Abdomen is soft.     Tenderness: There is no abdominal tenderness. There is no guarding.   Musculoskeletal:     Right lower leg: No edema.     Left lower leg: No edema.  Skin:    General: Skin is warm and dry.     Capillary Refill: Capillary refill takes less than 2 seconds.  Neurological:     General: No focal deficit present.     Mental Status: She is alert and oriented to person, place, and time.     Data Reviewed: I have personally reviewed following labs and imaging studies  CBC: Recent Labs  Lab 06/26/24 1647  WBC 9.8  HGB 6.2*  HCT 21.9*  MCV 66.2*  PLT 318   Basic Metabolic Panel: Recent Labs  Lab 06/26/24 1647  NA 136  K 4.1  CL 105  CO2 23  GLUCOSE 107*  BUN 10  CREATININE 0.99  CALCIUM  8.5*   GFR: Estimated Creatinine Clearance: 56.4 mL/min (by C-G formula based on SCr of 0.99 mg/dL). Liver Function Tests: Recent Labs  Lab 06/26/24 1647  AST 22  ALT 21  ALKPHOS 82  BILITOT 0.3  PROT 7.1  ALBUMIN 3.5   Recent Labs  Lab 06/26/24 1647  LIPASE 29   Radiology Studies: US  PELVIC COMPLETE WITH TRANSVAGINAL Result Date: 06/26/2024 CLINICAL DATA:  Heavy menstrual bleeding. EXAM: TRANSABDOMINAL AND TRANSVAGINAL ULTRASOUND OF PELVIS TECHNIQUE: Both transabdominal and transvaginal ultrasound examinations of the pelvis were performed. Transabdominal technique was performed for global imaging of the pelvis including uterus, ovaries, adnexal regions, and pelvic cul-de-sac. It was necessary to proceed with endovaginal exam following the transabdominal exam to visualize the uterus, endometrium, bilateral ovaries and bilateral adnexa. COMPARISON:  None Available. FINDINGS: Uterus Measurements: 8.4 cm x 4.8 cm x 6.1 cm = volume: 128.1 mL. 3.2 cm x 3.0 cm x 2.5 cm and 1.8 cm x 1.4 cm x 1.9 cm heterogeneous uterine fibroids are seen within the anterior and posterior aspects of the uterus. Endometrium Thickness: 6.0 mm.  No focal abnormality visualized. Right ovary Measurements: 2.9 cm x 1.4 cm x 2.4 cm = volume: 5.0 mL. A 2.2 cm x 1.1 cm x 1.2 cm right  ovarian cyst is seen. Left ovary Measurements: 3.5 cm x 2.7 cm x 3.5 cm = volume: 17.4 mL. A 2.9 cm x 2.2 cm x 2.8 cm left ovarian cyst is seen. Other findings No abnormal free fluid. IMPRESSION: 1. Heterogeneous uterine fibroids. 2. Bilateral ovarian cysts, as described above. Correlation with 6-12 week follow-up pelvic ultrasound is recommended to determine stability. Electronically Signed   By: Suzen Dials M.D.   On: 06/26/2024 22:19    Scheduled Meds:  sodium chloride    Intravenous Once   atorvastatin   20 mg Oral Daily   megestrol   120 mg Oral Daily   methimazole   10 mg Oral TID   metoprolol  tartrate  12.5 mg Oral q AM   And   metoprolol  tartrate  25 mg Oral QPM   sodium chloride  flush  3 mL Intravenous Q12H   Continuous Infusions:   LOS: 0 days   Time  spent: 55 minutes  Camellia Door, DO  [facility]  06/27/2024, 8:56 AM

## 2024-06-27 NOTE — Assessment & Plan Note (Signed)
 06-27-2024 stable.

## 2024-06-27 NOTE — Assessment & Plan Note (Signed)
 06-27-2024 continue lopressor . Holding Eliquis  until her vaginal bleeding issues are addressed in the GYN office.

## 2024-06-27 NOTE — Progress Notes (Signed)
 Pt taken outside to private vehicle via wheelchair.  Discharged home in stable condition with all belongings.

## 2024-06-27 NOTE — Assessment & Plan Note (Signed)
 06-27-2024 GYN consult reviewed. Vaginal bleeding already improved after 1 dose IV premarin . She will continue megace  at discharge. F/u with OB/GYN. She will need to hold Eliquis  until her vaginal bleeding issues are addressed in the GYN office.

## 2024-06-27 NOTE — Discharge Summary (Signed)
 [facility] Phys[facility] Discharge Summary   Patient name: Susan [name]  Admit date:     06/26/2024  Discharge date: 06/27/2024  Attending Physician: SEGARS, JONATHAN [[id number]]  Discharge Physician: Camellia Door   PCP: Celestia Rosaline SQUIBB, NP  Admitted From: Home  Disposition:  Home  Recommendations for Outpatient Follow-up:  Follow up with PCP in 1-2 weeks Follow up with OB/GYN in 2 weeks  Home Health:No Equipment/Devices: None    Discharge Condition:Stable CODE STATUS:FULL Diet recommendation: Diabetic Fluid Restriction: None  Hospital Summary: HPI: Susan [name] is a 48 y.o. female with hx of Paroxysmal afib on AC, Hyperthyroidism, HTN, DM, HLD, who presents with heavy vaginal bleeding, lightheadedness and SOB. Reports that typically she has regular periods which last about 2-5 days without heavy bleeding. This period has been ongoing for about 12-13 days with much heavier bleeding, requiring her to change pads every 30 min - 1 hr. Passing large blood clots. She had been on Apixaban  but spoke with her outpatient doctor who recommended holding, so has been off since Fri 7/18. She does use occasional NSAID but strictly avoids during any menstrual period due to risk of bleeding, no recent use.   Significant Events: Admitted 06/26/2024 for symptomatic anemia 06-26-2024 transfused 2 units PRBC  Admission Labs: WBC 9.8, HgB 6.2, plt 318 Na 136, K 4.1, CO2 of 23, BUN 10, Scr 0.99, glu 107 T prot 7.1, alb 3.5, AST 22, ALT 21, alk phos 82, T. Bili 0.3 Lipase 29 Urine pregnancy test Negative  Admission Imaging Studies: Pelvis U/S Heterogeneous uterine fibroids. 2. Bilateral ovarian cysts, as described above. Correlation with 6-12 week follow-up pelvic ultrasound is recommended to determine stability.  Significant Labs:   Significant Imaging Studies:   Antibiotic Therapy: Anti-infectives (From admission, onward)    None        Procedures:   Consultants: OB/BYN   Hospital Course by Problem: * Symptomatic anemia 06-27-2024 s/p 2 unit PRBC transfusion. Awaiting repeat CBC.  *update. Repeat HgB after 2 units PRBC transfusion is 8.8 g/dl. Unfortunately, iron studies not done on blood prior to transfusion. Iron profile was performed AFTER PRBC transfusion.  Heavy menstrual bleeding 06-27-2024 GYN consult reviewed. Vaginal bleeding already improved after 1 dose IV premarin . She will continue megace  at discharge. F/u with OB/GYN. She will need to hold Eliquis  until her vaginal bleeding issues are addressed in the GYN office.  Paroxysmal atrial fibrillation (HCC) 06-27-2024 continue lopressor . Holding Eliquis  until her vaginal bleeding issues are addressed in the GYN office.   Essential hypertension 06-27-2024 continue lopressor .   Obesity, Class I, BMI 30-34.9 06-27-2024 Body mass index is 34.44 kg/m.   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)   Obesity, Class I, BMI 30-34.9   Discharge Instructions  Discharge Instructions     Call MD for:  difficulty breathing, headache or visual disturbances   Complete by: As directed    Call MD for:  extreme fatigue   Complete by: As directed    Call MD for:  hives   Complete by: As directed    Call MD for:  persistant dizziness or light-headedness   Complete by: As directed    Call MD for:  persistant nausea and vomiting   Complete by: As directed  Call MD for:  redness, tenderness, or signs of infection (pain, swelling, redness, odor or green/yellow discharge around incision site)   Complete by: As directed    Call MD for:  severe uncontrolled pain   Complete by: As directed    Call MD for:  temperature >100.4   Complete by:  As directed    Diet - low sodium heart healthy   Complete by: As directed    Diet Carb Modified   Complete by: As directed    Discharge instructions   Complete by: As directed    1. Follow up with your primary care provider in 1-2 weeks following discharge from hospital. 2. Follow up with OB/GYN in the office. Call office for appointment in 2 weeks.   Increase activity slowly   Complete by: As directed       Allergies as of 06/27/2024   No Known Allergies      Medication List     PAUSE taking these medications    apixaban  5 MG Tabs tablet Wait to take this until your doctor or other care provider tells you to start again. Commonly known as: ELIQUIS  Take 1 tablet (5 mg total) by mouth 2 (two) times daily.       STOP taking these medications    ibuprofen  600 MG tablet Commonly known as: ADVIL        TAKE these medications    atorvastatin  20 MG tablet Commonly known as: LIPITOR Tome 1 tableta (20 mg en total) por va oral diariamente. (Take 1 tablet (20 mg total) by mouth daily.)   lisinopril  5 MG tablet Commonly known as: ZESTRIL  Tome 1 tableta (5 mg en total) por va oral diariamente. (Take 1 tablet (5 mg total) by mouth daily.)   megestrol  40 MG tablet Commonly known as: MEGACE  Take 3 tablets (120 mg total) by mouth daily. Start taking on: June 28, 2024   metFORMIN  500 MG 24 hr tablet Commonly known as: GLUCOPHAGE -XR Take 1 tablet (500 mg total) by mouth 2 (two) times daily.   methimazole  10 MG tablet Commonly known as: TAPAZOLE  Take 1 tablet (10 mg total) by mouth 3 (three) times daily.   metoprolol  tartrate 25 MG tablet Commonly known as: LOPRESSOR  Take 0.5 tablets (12.5 mg total) by mouth in the morning AND 1 tablet (25 mg total) every evening.        Follow-up Information     [facility]. Schedule an appointment as soon as possible for a visit in 2 week(s).   Contact information: [address]  [place]  [id number] [phone]               No Known Allergies  Discharge Exam: Vitals:   06/27/24 0747 06/27/24 1135  BP: 112/60 (!) 105/47  Pulse: 67 65  Resp: 17 17  Temp: 98.8 F (37.1 C) 99 F (37.2 C)  SpO2: 99% 99%    Physical Exam Vitals and nursing note reviewed.  Constitutional:      General: She is not in acute distress.    Appearance: She is obese. She is not ill-appearing, toxic-appearing or diaphoretic.  HENT:     Head: Normocephalic and atraumatic.  Eyes:     General: No scleral icterus. Cardiovascular:     Rate and Rhythm: Normal rate and regular rhythm.  Pulmonary:     Effort: Pulmonary effort is normal.     Breath sounds: Normal breath sounds.  Abdominal:     General:  Bowel sounds are normal. There is no distension.     Palpations: Abdomen is soft.     Tenderness: There is no abdominal tenderness. There is no guarding.  Musculoskeletal:     Right lower leg: No edema.     Left lower leg: No edema.  Skin:    General: Skin is warm and dry.     Capillary Refill: Capillary refill takes less than 2 seconds.  Neurological:     General: No focal deficit present.     Mental Status: She is alert and oriented to person, place, and time.     The results of significant diagnostics from this hospitalization (including imaging, microbiology, ancillary and laboratory) are listed below for reference.     Labs: Basic Metabolic Panel: Recent Labs  Lab 06/26/24 1647 06/27/24 0928  NA 136 135  K 4.1 3.6  CL 105 103  CO2 23 22  GLUCOSE 107* 177*  BUN 10 8  CREATININE 0.99 0.61  CALCIUM  8.5* 8.4*  MG  --  2.0  PHOS  --  2.7   Liver Function Tests: Recent Labs  Lab 06/26/24 1647  AST 22  ALT 21  ALKPHOS 82  BILITOT 0.3  PROT 7.1  ALBUMIN 3.5   Recent Labs  Lab 06/26/24 1647  LIPASE 29   CBC: Recent Labs  Lab 06/26/24 1647 06/27/24 0928  WBC 9.8 11.0*  HGB 6.2* 8.8*  HCT 21.9* 27.8*  MCV 66.2* 69.3*  PLT 318 304    Anemia work up Recent Labs    06/27/24 0928  FERRITIN 7*  TIBC 441  IRON 364*   Urinalysis    Component Value Date/Time   COLORURINE RED (A) 06/26/2024 1647   APPEARANCEUR CLOUDY (A) 06/26/2024 1647   LABSPEC 1.014 06/26/2024 1647   PHURINE 9.0 (H) 06/26/2024 1647   GLUCOSEU NEGATIVE 06/26/2024 1647   HGBUR LARGE (A) 06/26/2024 1647   BILIRUBINUR NEGATIVE 06/26/2024 1647   BILIRUBINUR negative 06/07/2024 1010   KETONESUR NEGATIVE 06/26/2024 1647   PROTEINUR 100 (A) 06/26/2024 1647   UROBILINOGEN 0.2 06/07/2024 1010   NITRITE NEGATIVE 06/26/2024 1647   LEUKOCYTESUR TRACE (A) 06/26/2024 1647   Sepsis Labs Recent Labs  Lab 06/26/24 1647 06/27/24 0928  WBC 9.8 11.0*    Procedures/Studies: US  PELVIC COMPLETE WITH TRANSVAGINAL Result Date: 06/26/2024 CLINICAL DATA:  Heavy menstrual bleeding. EXAM: TRANSABDOMINAL AND TRANSVAGINAL ULTRASOUND OF PELVIS TECHNIQUE: Both transabdominal and transvaginal ultrasound examinations of the pelvis were performed. Transabdominal technique was performed for global imaging of the pelvis including uterus, ovaries, adnexal regions, and pelvic cul-de-sac. It was necessary to proceed with endovaginal exam following the transabdominal exam to visualize the uterus, endometrium, bilateral ovaries and bilateral adnexa. COMPARISON:  None Available. FINDINGS: Uterus Measurements: 8.4 cm x 4.8 cm x 6.1 cm = volume: 128.1 mL. 3.2 cm x 3.0 cm x 2.5 cm and 1.8 cm x 1.4 cm x 1.9 cm heterogeneous uterine fibroids are seen within the anterior and posterior aspects of the uterus. Endometrium Thickness: 6.0 mm.  No focal abnormality visualized. Right ovary Measurements: 2.9 cm x 1.4 cm x 2.4 cm = volume: 5.0 mL. A 2.2 cm x 1.1 cm x 1.2 cm right ovarian cyst is seen. Left ovary Measurements: 3.5 cm x 2.7 cm x 3.5 cm = volume: 17.4 mL. A 2.9 cm x 2.2 cm x 2.8 cm left ovarian cyst is seen. Other findings No abnormal free fluid. IMPRESSION: 1. Heterogeneous uterine  fibroids. 2. Bilateral ovarian cysts, as described above. Correlation  with 6-12 week follow-up pelvic ultrasound is recommended to determine stability. Electronically Signed   By: Suzen Dials M.D.   On: 06/26/2024 22:19    Time coordinating discharge: 55 mins  SIGNED:  Camellia Door, DO [facility] 06/27/24, 11:50 AM

## 2024-06-27 NOTE — Assessment & Plan Note (Signed)
 06-27-2024 continue lopressor .

## 2024-06-28 ENCOUNTER — Telehealth (INDEPENDENT_AMBULATORY_CARE_PROVIDER_SITE_OTHER): Payer: Self-pay

## 2024-06-28 LAB — BPAM RBC
Blood Product Expiration Date: 202507262359
Blood Product Expiration Date: 202507252359
ISSUE DATE / TIME: 202507210239
ISSUE DATE / TIME: 202507202226
Unit Type and Rh: 600
Unit Type and Rh: 600

## 2024-06-28 LAB — TYPE AND SCREEN
ABO/RH(D): A NEG
Antibody Screen: NEGATIVE
Unit division: 0
Unit division: 0

## 2024-06-28 NOTE — Transitions of Care (Post Inpatient/ED Visit) (Signed)
   06/28/2024  Name: Susan [name] MRN: [id number] DOB: Jan 08, 1976  Today's TOC FU Call Status: Today's TOC FU Call Status:: Successful TOC FU Call Completed Unsuccessful Call (1st Attempt) Date: 06/28/24 [facility] FU Call Complete Date: 06/28/24 Patient's Name and Date of Birth confirmed.  Transition Care Management Follow-up Telephone Call Date of Discharge: 06/27/24 Discharge Facility: [facility]) Type of Discharge: Inpatient Admission Primary Inpatient Discharge Diagnosis:: vaginal bleeding How have you been since you were released from the hospital?: Better Any questions or concerns?: No  Items Reviewed: Did you receive and understand the discharge instructions provided?: Yes Medications obtained,verified, and reconciled?: Yes (Medications Reviewed) Any new allergies since your discharge?: No Dietary orders reviewed?: Yes Do you have support at home?: Yes People in Home [RPT]: child(ren), adult  Medications Reviewed Today: Medications Reviewed Today     Reviewed by Emmitt Pan, LPN (Licensed Practical Nurse) on 06/28/24 at 1313  Med List Status: <None>   Medication Order Taking? Sig Documenting Provider Last Dose Status Informant  apixaban  (ELIQUIS ) 5 MG TABS tablet [id number]  Take 1 tablet (5 mg total) by mouth 2 (two) times daily.  Patient not taking: Reported on 06/28/2024   Terra Fairy PARAS, PA-C  Active Self, Pharmacy Records, Child           Med Note [facility], DONETA GORMAN Repress Jun 26, 2024 10:29 PM) Last taken 0900  atorvastatin  (LIPITOR) 20 MG tablet [id number] Yes Take 1 tablet (20 mg total) by mouth daily. Celestia Rosaline SQUIBB, NP  Active Self, Pharmacy Records, Child  lisinopril  (ZESTRIL ) 5 MG tablet [id number] Yes Take 1 tablet (5 mg total) by mouth daily. Celestia Rosaline SQUIBB, NP  Active Self, Pharmacy Records, Child  megestrol  (MEGACE ) 40 MG tablet [id number] Yes Take 3 tablets (120 mg total) by mouth daily. Laurence Locus, DO  Active   metFORMIN   (GLUCOPHAGE -XR) 500 MG 24 hr tablet [id number] Yes Take 1 tablet (500 mg total) by mouth 2 (two) times daily. Celestia Rosaline SQUIBB, NP  Active Self, Pharmacy Records, Child  methimazole  (TAPAZOLE ) 10 MG tablet [id number] Yes Take 1 tablet (10 mg total) by mouth 3 (three) times daily. Dartha Ernst, MD  Active Self, Pharmacy Records, Child  metoprolol  tartrate (LOPRESSOR ) 25 MG tablet [id number] Yes Take 0.5 tablets (12.5 mg total) by mouth in the morning AND 1 tablet (25 mg total) every evening. Terra Fairy PARAS, PA-C  Active Self, Pharmacy Records, Child            Home Care and Equipment/Supplies: Were Home Health Services Ordered?: NA Any new equipment or medical supplies ordered?: NA  Functional Questionnaire: Do you need assistance with bathing/showering or dressing?: No Do you need assistance with meal preparation?: No Do you need assistance with eating?: No Do you have difficulty maintaining continence: No Do you need assistance with getting out of bed/getting out of a chair/moving?: No Do you have difficulty managing or taking your medications?: No  Follow up appointments reviewed: PCP Follow-up appointment confirmed?: Yes Date of PCP follow-up appointment?: 07/12/24 Follow-up Provider: [facility] Follow-up appointment confirmed?: No Reason Specialist Follow-Up Not Confirmed: Patient has Specialist Provider Number and will Call for Appointment Do you need transportation to your follow-up appointment?: No Do you understand care options if your condition(s) worsen?: Yes-patient verbalized understanding    SIGNATURE Pan Emmitt, LPN [facility] Nurse Health Advisor Direct Dial [phone]

## 2024-06-28 NOTE — Transitions of Care (Post Inpatient/ED Visit) (Signed)
   06/28/2024  Name: Susan [name] MRN: [id number] DOB: 1976-03-04  Today's TOC FU Call Status: Today's TOC FU Call Status:: Unsuccessful Call (1st Attempt) Unsuccessful Call (1st Attempt) Date: 06/28/24  Attempted to reach the patient regarding the most recent Inpatient/ED visit.  Follow Up Plan: Additional outreach attempts will be made to reach the patient to complete the Transitions of Care (Post Inpatient/ED visit) call.   Signature Julian Lemmings, LPN [facility] Nurse Health Advisor Direct Dial [phone]

## 2024-07-04 ENCOUNTER — Ambulatory Visit (INDEPENDENT_AMBULATORY_CARE_PROVIDER_SITE_OTHER): Payer: Self-pay | Admitting: Primary Care

## 2024-07-05 ENCOUNTER — Other Ambulatory Visit: Payer: Self-pay

## 2024-07-06 ENCOUNTER — Other Ambulatory Visit (HOSPITAL_COMMUNITY): Payer: Self-pay | Admitting: *Deleted

## 2024-07-06 ENCOUNTER — Other Ambulatory Visit: Payer: Self-pay

## 2024-07-08 ENCOUNTER — Other Ambulatory Visit: Payer: Self-pay

## 2024-07-08 ENCOUNTER — Other Ambulatory Visit (HOSPITAL_COMMUNITY): Payer: Self-pay | Admitting: *Deleted

## 2024-07-08 MED ORDER — APIXABAN 5 MG PO TABS
5.0000 mg | ORAL_TABLET | Freq: Two times a day (BID) | ORAL | 3 refills | Status: AC
  Filled 2024-07-08 – 2024-09-07 (×2): qty 60, 30d supply, fill #0

## 2024-07-12 ENCOUNTER — Encounter (INDEPENDENT_AMBULATORY_CARE_PROVIDER_SITE_OTHER): Payer: Self-pay | Admitting: Primary Care

## 2024-07-12 ENCOUNTER — Ambulatory Visit (INDEPENDENT_AMBULATORY_CARE_PROVIDER_SITE_OTHER): Payer: Self-pay | Admitting: Primary Care

## 2024-07-12 VITALS — BP 119/68 | HR 60 | Resp 16 | Wt 157.4 lb

## 2024-07-12 DIAGNOSIS — N939 Abnormal uterine and vaginal bleeding, unspecified: Secondary | ICD-10-CM

## 2024-07-12 DIAGNOSIS — D500 Iron deficiency anemia secondary to blood loss (chronic): Secondary | ICD-10-CM

## 2024-07-12 NOTE — Progress Notes (Signed)
 Subjective:  Susan [name] is a 48 y.o. female presents for hospital follow up  Admit date to the hospital was 06/26/24, patient was discharged from the hospital on 06/27/24, patient was admitted for: Vaginal bleeding, Dizziness and Palpitations. Today she is feeling better and menstrual cycle stopped. Taking Megace  40mg  TID. Prior to ED seen cardiology Atrial fibrillation, unspecified type (HCC), Paroxysmal atrial fibrillation (HCC), Hypercoagulable state due to paroxysmal atrial fibrillation [facility])  Past Medical History:  Diagnosis Date   Atrial fibrillation [facility])    Atrial fibrillation with rapid ventricular response (HCC) 06/19/2023   Diabetes 1.5, managed as type 2 (HCC)    DM (diabetes mellitus) (HCC)    Hypertension    Hyperthyroidism      No Known Allergies  Current Outpatient Medications on File Prior to Visit  Medication Sig Dispense Refill   apixaban  (ELIQUIS ) 5 MG TABS tablet Take 1 tablet (5 mg total) by mouth 2 (two) times daily. 60 tablet 3   atorvastatin  (LIPITOR) 20 MG tablet Take 1 tablet (20 mg total) by mouth daily. 90 tablet 1   lisinopril  (ZESTRIL ) 5 MG tablet Take 1 tablet (5 mg total) by mouth daily. 90 tablet 1   megestrol  (MEGACE ) 40 MG tablet Take 3 tablets (120 mg total) by mouth daily. 90 tablet 0   metFORMIN  (GLUCOPHAGE -XR) 500 MG 24 hr tablet Take 1 tablet (500 mg total) by mouth 2 (two) times daily. 180 tablet 1   methimazole  (TAPAZOLE ) 10 MG tablet Take 1 tablet (10 mg total) by mouth 3 (three) times daily. 270 tablet 0   metoprolol  tartrate (LOPRESSOR ) 25 MG tablet Take 0.5 tablets (12.5 mg total) by mouth in the morning AND 1 tablet (25 mg total) every evening. 60 tablet 6   No current facility-administered medications on file prior to visit.    Review of System: ROS Comprehensive ROS Pertinent positive and negative noted in HPI   Objective:  BP 119/68   Pulse 60   Resp 16   Wt 157 lb 6.4 oz (71.4 kg)   LMP 06/26/2024  (Approximate)   SpO2 100%   BMI 34.06 kg/m   Filed Weights   07/12/24 1035  Weight: 157 lb 6.4 oz (71.4 kg)    Physical Exam Vitals reviewed.  Constitutional:      Appearance: Normal appearance. She is obese.  HENT:     Head: Normocephalic.     Right Ear: Tympanic membrane, ear canal and external ear normal.     Left Ear: Tympanic membrane, ear canal and external ear normal.     Nose: Nose normal.     Mouth/Throat:     Mouth: Mucous membranes are moist.  Eyes:     Extraocular Movements: Extraocular movements intact.     Pupils: Pupils are equal, round, and reactive to light.  Cardiovascular:     Rate and Rhythm: Normal rate.  Pulmonary:     Effort: Pulmonary effort is normal.     Breath sounds: Normal breath sounds.  Abdominal:     General: Bowel sounds are normal.     Palpations: Abdomen is soft.  Musculoskeletal:        General: Normal range of motion.     Cervical back: Normal range of motion.  Skin:    General: Skin is warm and dry.  Neurological:     Mental Status: She is alert and oriented to person, place, and time.  Psychiatric:        Mood and Affect:  Mood normal.        Behavior: Behavior normal.        Thought Content: Thought content normal.      Assessment:  [name] was seen today for hospitalization follow-up.  Diagnoses and all orders for this visit:  Abnormal uterine bleeding (AUB) -     Ambulatory referral to Gynecology  Anemia due to chronic blood loss -     Ambulatory referral to Gynecology     This note has been created with Dragon speech recognition software and Paediatric nurse. Any transcriptional errors are unintentional.     [name], NP 07/18/2024, 12:01 AM

## 2024-07-14 ENCOUNTER — Ambulatory Visit
Admission: RE | Admit: 2024-07-14 | Discharge: 2024-07-14 | Disposition: A | Discharge status: Home / Self Care | Payer: Self-pay | Source: Ambulatory Visit | Attending: Obstetrics and Gynecology | Admitting: Obstetrics and Gynecology

## 2024-07-14 ENCOUNTER — Ambulatory Visit: Payer: Self-pay | Admitting: *Deleted

## 2024-07-14 VITALS — BP 140/61 | Wt 160.0 lb

## 2024-07-14 DIAGNOSIS — Z1231 Encounter for screening mammogram for malignant neoplasm of breast: Secondary | ICD-10-CM

## 2024-07-14 DIAGNOSIS — Z1239 Encounter for other screening for malignant neoplasm of breast: Secondary | ICD-10-CM

## 2024-07-14 NOTE — Patient Instructions (Signed)
 Explained breast self awareness with Susan [name] Maureen. Patient did not need a Pap smear today due to last Pap smear and HPV typing was 12/21/2023. Let her know BCCCP will cover Pap smears and HPV typing every 5 years unless has a history of abnormal Pap smears. Referred patient to the [facility] for a screening mammogram on mobile unit. Appointment scheduled Thursday, July 14, 2024 at 1500. Patient aware of appointment and will be there. Let patient know the Breast Center will follow up with her within the next couple weeks with results of her mammogram by letter or phone. Susan [name] Soto verbalized understanding.  Masen Luallen, Wanda Ship, RN 2:00 PM

## 2024-07-14 NOTE — Progress Notes (Signed)
 [name] is a 48 y.o. female who presents to [facility] clinic today with no complaints.    Pap Smear: Pap smear not completed today. Last Pap smear was 12/21/2023 at [facility] clinic and was normal with negative HPV. Per patient has no history of an abnormal Pap smear. Last Pap smear result is available in Epic.   Physical exam: Breasts Breasts symmetrical. No skin abnormalities bilateral breasts. No nipple retraction bilateral breasts. No nipple discharge bilateral breasts. No lymphadenopathy. No lumps palpated bilateral breasts. No complaints of pain or tenderness on exam.  MS DIGITAL SCREENING TOMO BILATERAL Result Date: 04/13/2023 CLINICAL DATA:  Screening. EXAM: DIGITAL SCREENING BILATERAL MAMMOGRAM WITH TOMOSYNTHESIS AND CAD TECHNIQUE: Bilateral screening digital craniocaudal and mediolateral oblique mammograms were obtained. Bilateral screening digital breast tomosynthesis was performed. The images were evaluated with computer-aided detection. COMPARISON:  Previous exam(s). ACR Breast Density Category c: The breasts are heterogeneously dense, which may obscure small masses. FINDINGS: There are no findings suspicious for malignancy. IMPRESSION: No mammographic evidence of malignancy. A result letter of this screening mammogram will be mailed directly to the patient. RECOMMENDATION: Screening mammogram in one year. (Code:SM-B-01Y) BI-RADS CATEGORY  1: Negative. Electronically Signed   By: Almarie Daring M.D.   On: 04/13/2023 11:54   MS DIGITAL SCREENING TOMO BILATERAL Result Date: 01/14/2022 CLINICAL DATA:  Screening. EXAM: DIGITAL SCREENING BILATERAL MAMMOGRAM WITH TOMOSYNTHESIS AND CAD TECHNIQUE: Bilateral screening digital craniocaudal and mediolateral oblique mammograms were obtained. Bilateral screening digital breast tomosynthesis was performed. The images were evaluated with computer-aided detection. COMPARISON:  Previous exam(s). ACR Breast Density Category  c: The breast tissue is heterogeneously dense, which may obscure small masses. FINDINGS: There are no findings suspicious for malignancy. IMPRESSION: No mammographic evidence of malignancy. A result letter of this screening mammogram will be mailed directly to the patient. RECOMMENDATION: Screening mammogram in one year. (Code:SM-B-01Y) BI-RADS CATEGORY  1: Negative. Electronically Signed   By: Toribio Agreste M.D.   On: 01/14/2022 12:00   MS DIGITAL SCREENING TOMO BILATERAL Result Date: 01/04/2021 CLINICAL DATA:  Screening. EXAM: DIGITAL SCREENING BILATERAL MAMMOGRAM WITH TOMO AND CAD COMPARISON:  Previous exam(s). ACR Breast Density Category c: The breast tissue is heterogeneously dense, which may obscure small masses. FINDINGS: There are no findings suspicious for malignancy. The images were evaluated with computer-aided detection. IMPRESSION: No mammographic evidence of malignancy. A result letter of this screening mammogram will be mailed directly to the patient. RECOMMENDATION: Screening mammogram in one year. (Code:SM-B-01Y) BI-RADS CATEGORY  1: Negative. Electronically Signed   By: Inocente Ast M.D.   On: 01/04/2021 09:12   MS DIGITAL SCREENING TOMO BILATERAL Result Date: 11/15/2019 CLINICAL DATA:  Screening. EXAM: DIGITAL SCREENING BILATERAL MAMMOGRAM WITH TOMO AND CAD COMPARISON:  Previous exam(s). ACR Breast Density Category c: The breast tissue is heterogeneously dense, which may obscure small masses. FINDINGS: There are no findings suspicious for malignancy. Images were processed with CAD. IMPRESSION: No mammographic evidence of malignancy. A result letter of this screening mammogram will be mailed directly to the patient. RECOMMENDATION: Screening mammogram in one year. (Code:SM-B-01Y) BI-RADS CATEGORY  1: Negative. Electronically Signed   By: Inocente Ast M.D.   On: 11/15/2019 14:07       Pelvic/Bimanual Pap is not indicated today per BCCCP guidelines.   Smoking History: Patient  has never smoked.   Patient Navigation: Patient education provided. Access to services provided for patient through [place] program. Spanish interpreter Bernice Angry from [facility] provided.   Colorectal Cancer  Screening: Per patient has never had colonoscopy completed. Patient completed a FIT Test given by her PCP in January 2025 that was negative. No complaints today.    Breast and Cervical Cancer Risk Assessment: Patient does not have family history of breast cancer, known genetic mutations, or radiation treatment to the chest before age 34. Patient does not have history of cervical dysplasia, immunocompromised, or DES exposure in-utero.  Risk Scores as of Encounter on 07/14/2024     Susan [name]           5-year 0.39%   Lifetime 4.25%            Last calculated by Susan [name], CMA on 07/14/2024 at  2:59 PM        A: BCCCP exam without pap smear No complaints.  P: Referred patient to the [facility] for a screening mammogram on mobile unit. Appointment scheduled Thursday, July 14, 2024 at 1500.  Susan Wanda SQUIBB, RN 07/14/2024 2:00 PM

## 2024-07-18 ENCOUNTER — Encounter (INDEPENDENT_AMBULATORY_CARE_PROVIDER_SITE_OTHER): Payer: Self-pay | Admitting: Primary Care

## 2024-08-09 ENCOUNTER — Other Ambulatory Visit (INDEPENDENT_AMBULATORY_CARE_PROVIDER_SITE_OTHER): Payer: Self-pay | Admitting: Primary Care

## 2024-08-09 ENCOUNTER — Other Ambulatory Visit: Payer: Self-pay

## 2024-08-09 NOTE — Telephone Encounter (Signed)
 Requested medications are due for refill today.  unsure  Requested medications are on the active medications list.  no  Last refill. 06/28/2024 #[id number] rf  Future visit scheduled.   yes  Notes to clinic.  Med not on med list. Rx signed by Dr. Laurence. Rx expired 07/28/2024.    Requested Prescriptions  Pending Prescriptions Disp Refills   megestrol  (MEGACE ) 40 MG tablet 90 tablet 0    Sig: Take 3 tablets (120 mg total) by mouth daily.     Not Delegated - OB/GYN:  Progestins - megestrol  acetate Failed - 08/09/2024  4:10 PM      Failed - This refill cannot be delegated      Failed - Last BP in normal range    BP Readings from Last 1 Encounters:  07/14/24 (!) 140/61         Passed - Valid encounter within last 12 months    Recent Outpatient Visits           4 weeks ago Abnormal uterine bleeding (AUB)   [place] [facility] Celestia Rosaline SQUIBB, NP   2 months ago Burning with urination   [place] [facility] Celestia Rosaline SQUIBB, NP   4 months ago Type 2 diabetes mellitus without complication, without long-term current use of insulin  (HCC)   [place] [facility] Celestia Rosaline SQUIBB, NP   7 months ago Type 2 diabetes mellitus without complication, without long-term current use of insulin  [facility])   [place] [facility] Celestia Rosaline SQUIBB, NP   10 months ago Influenza vaccination declined   [place] [facility] Celestia Rosaline SQUIBB, NP

## 2024-08-10 ENCOUNTER — Other Ambulatory Visit: Payer: Self-pay

## 2024-08-12 ENCOUNTER — Other Ambulatory Visit: Payer: Self-pay

## 2024-08-12 MED ORDER — MEGESTROL ACETATE 40 MG PO TABS
120.0000 mg | ORAL_TABLET | Freq: Every day | ORAL | 0 refills | Status: AC
  Filled 2024-08-12 – 2024-08-29 (×2): qty 90, 30d supply, fill #0

## 2024-08-23 ENCOUNTER — Other Ambulatory Visit: Payer: Self-pay

## 2024-08-29 ENCOUNTER — Other Ambulatory Visit (INDEPENDENT_AMBULATORY_CARE_PROVIDER_SITE_OTHER): Payer: Self-pay | Admitting: Primary Care

## 2024-08-29 ENCOUNTER — Other Ambulatory Visit: Payer: Self-pay

## 2024-08-29 DIAGNOSIS — Z760 Encounter for issue of repeat prescription: Secondary | ICD-10-CM

## 2024-08-29 DIAGNOSIS — E119 Type 2 diabetes mellitus without complications: Secondary | ICD-10-CM

## 2024-08-29 DIAGNOSIS — I10 Essential (primary) hypertension: Secondary | ICD-10-CM

## 2024-08-29 MED ORDER — METFORMIN HCL ER 500 MG PO TB24
500.0000 mg | ORAL_TABLET | Freq: Two times a day (BID) | ORAL | 1 refills | Status: DC
  Filled 2024-08-29 – 2024-09-29 (×3): qty 60, 30d supply, fill #0
  Filled 2024-10-31: qty 60, 30d supply, fill #1

## 2024-09-07 ENCOUNTER — Other Ambulatory Visit: Payer: Self-pay

## 2024-09-12 ENCOUNTER — Ambulatory Visit (INDEPENDENT_AMBULATORY_CARE_PROVIDER_SITE_OTHER): Payer: Self-pay | Admitting: Primary Care

## 2024-09-26 ENCOUNTER — Ambulatory Visit: Payer: Self-pay | Admitting: "Endocrinology

## 2024-09-29 ENCOUNTER — Other Ambulatory Visit: Payer: Self-pay

## 2024-10-03 ENCOUNTER — Other Ambulatory Visit: Payer: Self-pay

## 2024-10-06 ENCOUNTER — Ambulatory Visit: Payer: Self-pay | Admitting: "Endocrinology

## 2024-10-06 ENCOUNTER — Other Ambulatory Visit

## 2024-10-06 ENCOUNTER — Encounter: Payer: Self-pay | Admitting: "Endocrinology

## 2024-10-06 VITALS — BP 124/80 | HR 64 | Ht <= 58 in | Wt 165.0 lb

## 2024-10-06 DIAGNOSIS — E042 Nontoxic multinodular goiter: Secondary | ICD-10-CM

## 2024-10-06 DIAGNOSIS — E0500 Thyrotoxicosis with diffuse goiter without thyrotoxic crisis or storm: Secondary | ICD-10-CM

## 2024-10-06 NOTE — Progress Notes (Signed)
 Outpatient Endocrinology Note Susan Birmingham, MD  10/06/24   Susan [name] 10-02-1976 [id number]  Referring Provider: Celestia Rosaline SQUIBB, NP Primary Care Provider: Celestia Rosaline SQUIBB, NP Subjective  No chief complaint on file.   Assessment & Plan  Diagnoses and all orders for this visit:  Graves disease -     TSH -     T3, free -     T4, free  Multiple thyroid  nodules    Susan [name] is currently taking methimazole  10 mg thrice a day. Started MMI in 2024 TSH on MMI 10 mg bid was <0.01 with elevated FT3 at 5.8 Patient currently clinically and biochemically hyperthyroid.  Discussed the etiology for hyperthyroidism. Educated on thyroid  axis.  10/06/24 Recommend the following: Continue methimazole  10 mg thrice a day. Labs today. Repeat labs in 3 months or sooner if symptoms of hyper or hypothyroidism develop.  -compliance and follow up needs   -If you notice any symptoms of worsening fatigue, fever with sore throat, loss of appetite, yellowing of eyes, dark urine, joint pains, sores in the mouth, itchy rash, light colored stools or abdominal pain, please stop the medication and call us  immediately as this can be a serious side effect of the medication.  06/2023 thyroid  ultrasound reported 2 thyroid  nodules, none met criteria for f/u or FNA Both nodules 1.1 cm at max diameter and TR3 Follow up as needed  I have reviewed current medications, nurse's notes, allergies, vital signs, past medical and surgical history, family medical history, and social history for this encounter. Counseled patient on symptoms, examination findings, lab findings, imaging results, treatment decisions and monitoring and prognosis. The patient understood the recommendations and agrees with the treatment plan. All questions regarding treatment plan were fully answered.  Return in about 3 months (around 01/06/2025) for labs today, visit + labs before next  visit.  Susan Birmingham, MD  10/06/24  I have reviewed current medications, nurse's notes, allergies, vital signs, past medical and surgical history, family medical history, and social history for this encounter. Counseled patient on symptoms, examination findings, lab findings, imaging results, treatment decisions and monitoring and prognosis. The patient understood the recommendations and agrees with the treatment plan. All questions regarding treatment plan were fully answered.   History of Present Illness Susan [name] is a 48 y.o. year old female who presents to our clinic with graves disease diagnosed in 2024?    Been on methimazole  5 mg every day since 06/2023.  Currently on methimazole  10 mg tid  Symptoms suggestive of HYPO/HYPERTHYROIDISM:              Fatigue no weight gain/loss  Yes, yes 5 lbs gain  Cold/heat intolerance Yes, variable  Constipation/hyperdefecation  No palpitations  Yes, rare and self limiting    Compressive symptoms:  dysphagia  No dysphonia  No positional dyspnea (especially with simultaneous arms elevation)  Yes, some subjective SOB  Smokes  No On biotin  No Personal history of head/neck surgery/irradiation  No  Adverse Drug Effects from Methimazole  (MMI): rash No fever No throat pain No arthritis No mouth ulcers No jaundice No loss of appetite No lymphadenopathy No  Grave's Ophthalmopathy Clinical Activity Score: 0/9  06/2023 THYROID  ULTRASOUND   TECHNIQUE: Ultrasound examination of the thyroid  gland and adjacent soft tissues was performed.   COMPARISON:  None Available.   FINDINGS: Parenchymal Echotexture: Moderately heterogenous   Isthmus: 0.9 cm   Right lobe: 3.9 x 3.0 x 2.4  cm   Left lobe: 3.1 x 1.8 x 2.2 cm   _________________________________________________________   Estimated total number of nodules >/= 1 cm: 1   Number of spongiform nodules >/=  2 cm not described below (TR1): 0   Number of mixed  cystic and solid nodules >/= 1.5 cm not described below (TR2): 0   _________________________________________________________   Diffusely heterogeneous and enlarged thyroid  gland. Vascularity is within normal limits on color Doppler imaging. No significant hypervascularity.   Nodule # 1: Ill-defined solid hypoechoic nodule within the right lower gland measures 1.1 x 0.9 x 1.2 cm. Findings are consistent with TI-RADS category 3. Given size (<1.4 cm) and appearance, this nodule does NOT meet TI-RADS criteria for biopsy or dedicated follow-up.   Nodule # 2: Small hyperechoic nodule in the left lower gland measures 1.1 x 0.9 x 0.9 cm. Margins are ill-defined. No suspicious features. Findings are consistent with TI-RADS category 3. Given size (<1.4 cm) and appearance, this nodule does NOT meet TI-RADS criteria for biopsy or dedicated follow-up.   IMPRESSION: 1. Diffusely enlarged and heterogeneous thyroid  gland. Findings suggest either chronic thyroiditis or diffuse goitrous change. 2. Two small nodules are noted incidentally, 1 in the right inferior gland in 1 in the left inferior gland. Neither nodule meets criteria to warrant biopsy or dedicated imaging follow-up. No follow-up recommended.  Physical Exam  BP 124/80   Pulse 64   Ht 4' 9 (1.448 m)   Wt 165 lb (74.8 kg)   SpO2 96%   BMI 35.71 kg/m  Constitutional: well developed, well nourished Head: normocephalic, atraumatic, no exophthalmos Eyes: sclera anicteric, no redness Neck: no thyromegaly, no thyroid  tenderness Lungs: normal respiratory effort Neurology: alert and oriented, no fine hand tremor Skin: dry, no appreciable rashes Musculoskeletal: no appreciable defects Psychiatric: normal mood and affect  Allergies No Known Allergies  Current Medications Patient's Medications  New Prescriptions   No medications on file  Previous Medications   APIXABAN  (ELIQUIS ) 5 MG TABS TABLET    Take 1 tablet (5 mg total) by  mouth 2 (two) times daily.   ATORVASTATIN  (LIPITOR) 20 MG TABLET    Take 1 tablet (20 mg total) by mouth daily.   LISINOPRIL  (ZESTRIL ) 5 MG TABLET    Take 1 tablet (5 mg total) by mouth daily.   METFORMIN  (GLUCOPHAGE -XR) 500 MG 24 HR TABLET    Take 1 tablet (500 mg total) by mouth 2 (two) times daily.   METHIMAZOLE  (TAPAZOLE ) 10 MG TABLET    Take 1 tablet (10 mg total) by mouth 3 (three) times daily.   METOPROLOL  TARTRATE (LOPRESSOR ) 25 MG TABLET    Take 0.5 tablets (12.5 mg total) by mouth in the morning AND 1 tablet (25 mg total) every evening.  Modified Medications   No medications on file  Discontinued Medications   No medications on file    Past Medical History Past Medical History:  Diagnosis Date   Atrial fibrillation (HCC)    Atrial fibrillation with rapid ventricular response (HCC) 06/19/2023   Diabetes 1.5, managed as type 2 (HCC)    DM (diabetes mellitus) (HCC)    Hypertension    Hyperthyroidism     Past Surgical History History reviewed. No pertinent surgical history.  Family History family history includes Diabetes in her father, mother, sister, and sister; Hypertension in her father.  Social History Social History   Socioeconomic History   Marital status: Single    Spouse name: Not on file   Number of children:  4   Years of education: Not on file   Highest education level: 5th grade  Occupational History   Not on file  Tobacco Use   Smoking status: Never   Smokeless tobacco: Never  Vaping Use   Vaping status: Never Used  Substance and Sexual Activity   Alcohol use: No   Drug use: No   Sexual activity: Not Currently    Birth control/protection: Condom  Other Topics Concern   Not on file  Social History Narrative   Not on file   Social Drivers of Health   Financial Resource Strain: Medium Risk (09/18/2023)   Overall Financial Resource Strain (CARDIA)    Difficulty of Paying Living Expenses: Somewhat hard  Food Insecurity: Food Insecurity Present  (07/14/2024)   Hunger Vital Sign    Worried About Running Out of Food in the Last Year: Sometimes true    Ran Out of Food in the Last Year: Sometimes true  Transportation Needs: No Transportation Needs (07/14/2024)   PRAPARE - Administrator, Civil Service (Medical): No    Lack of Transportation (Non-Medical): No  Physical Activity: Not on file  Stress: Not on file  Social Connections: Not on file  Intimate Partner Violence: Not At Risk (06/26/2024)   Humiliation, Afraid, Rape, and Kick questionnaire    Fear of Current or Ex-Partner: No    Emotionally Abused: No    Physically Abused: No    Sexually Abused: No    Laboratory Investigations Lab Results  Component Value Date   TSH <0.01 (L) 06/20/2024   TSH <0.01 (L) 06/13/2024   TSH <0.01 (L) 04/11/2024   FREET4 0.9 06/20/2024   FREET4 1.2 06/13/2024   FREET4 1.3 04/11/2024     Lab Results  Component Value Date   TSI 280 (H) 01/08/2024     No components found for: TRAB   Lab Results  Component Value Date   CHOL 124 04/04/2024   Lab Results  Component Value Date   HDL 35 (L) 04/04/2024   Lab Results  Component Value Date   LDLCALC 66 04/04/2024   Lab Results  Component Value Date   TRIG 126 04/04/2024   Lab Results  Component Value Date   CHOLHDL 3.5 04/04/2024   Lab Results  Component Value Date   CREATININE 0.61 06/27/2024   No results found for: GFR    Component Value Date/Time   NA 135 06/27/2024 0928   NA 139 12/21/2023 1035   K 3.6 06/27/2024 0928   CL 103 06/27/2024 0928   CO2 22 06/27/2024 0928   GLUCOSE 177 (H) 06/27/2024 0928   BUN 8 06/27/2024 0928   BUN 11 12/21/2023 1035   CREATININE 0.61 06/27/2024 0928   CALCIUM  8.4 (L) 06/27/2024 0928   PROT 7.1 06/26/2024 1647   PROT 7.7 12/21/2023 1035   ALBUMIN 3.5 06/26/2024 1647   ALBUMIN 4.4 12/21/2023 1035   AST 22 06/26/2024 1647   ALT 21 06/26/2024 1647   ALKPHOS 82 06/26/2024 1647   BILITOT 0.3 06/26/2024 1647   BILITOT 0.3  12/21/2023 1035   GFRNONAA >60 06/27/2024 0928   GFRAA 115 10/16/2020 1017      Latest Ref Rng & Units 06/27/2024    9:28 AM 06/26/2024    4:47 PM 03/20/2024    2:39 PM  BMP  Glucose 70 - 99 mg/dL 822  892  839   BUN 6 - 20 mg/dL 8  10  12    Creatinine 0.44 - 1.00  mg/dL 9.38  9.00  9.07   Sodium 135 - 145 mmol/L 135  136  135   Potassium 3.5 - 5.1 mmol/L 3.6  4.1  3.9   Chloride 98 - 111 mmol/L 103  105  105   CO2 22 - 32 mmol/L 22  23  23    Calcium  8.9 - 10.3 mg/dL 8.4  8.5  8.7        Component Value Date/Time   WBC 11.0 (H) 06/27/2024 0928   RBC 4.01 06/27/2024 0928   HGB 8.8 (L) 06/27/2024 0928   HGB 11.2 12/21/2023 1035   HCT 27.8 (L) 06/27/2024 0928   HCT 37.6 12/21/2023 1035   PLT 304 06/27/2024 0928   PLT 269 12/21/2023 1035   MCV 69.3 (L) 06/27/2024 0928   MCV 70 (L) 12/21/2023 1035   MCH 21.9 (L) 06/27/2024 0928   MCHC 31.7 06/27/2024 0928   RDW 20.6 (H) 06/27/2024 0928   RDW 16.6 (H) 12/21/2023 1035   LYMPHSABS 3.4 (H) 12/21/2023 1035   MONOABS 0.8 06/06/2012 2030   EOSABS 0.0 12/21/2023 1035   BASOSABS 0.0 12/21/2023 1035      Parts of this note may have been dictated using voice recognition software. There may be variances in spelling and vocabulary which are unintentional. Not all errors are proofread. Please notify the dino if any discrepancies are noted or if the meaning of any statement is not clear.

## 2024-10-07 LAB — T4, FREE: Free T4: 0.5 ng/dL — ABNORMAL LOW (ref 0.8–1.8)

## 2024-10-07 LAB — T3, FREE: T3, Free: 2.6 pg/mL (ref 2.3–4.2)

## 2024-10-07 LAB — TSH: TSH: 4.71 m[IU]/L — ABNORMAL HIGH

## 2024-10-10 ENCOUNTER — Ambulatory Visit (INDEPENDENT_AMBULATORY_CARE_PROVIDER_SITE_OTHER): Payer: Self-pay | Admitting: Primary Care

## 2024-10-10 ENCOUNTER — Encounter (INDEPENDENT_AMBULATORY_CARE_PROVIDER_SITE_OTHER): Payer: Self-pay | Admitting: Primary Care

## 2024-10-10 VITALS — BP 131/78 | HR 61 | Resp 16 | Wt 165.2 lb

## 2024-10-10 DIAGNOSIS — E11649 Type 2 diabetes mellitus with hypoglycemia without coma: Secondary | ICD-10-CM

## 2024-10-10 DIAGNOSIS — Z7984 Long term (current) use of oral hypoglycemic drugs: Secondary | ICD-10-CM

## 2024-10-10 DIAGNOSIS — E119 Type 2 diabetes mellitus without complications: Secondary | ICD-10-CM

## 2024-10-10 DIAGNOSIS — D649 Anemia, unspecified: Secondary | ICD-10-CM

## 2024-10-10 DIAGNOSIS — M25562 Pain in left knee: Secondary | ICD-10-CM

## 2024-10-10 DIAGNOSIS — B351 Tinea unguium: Secondary | ICD-10-CM

## 2024-10-10 DIAGNOSIS — Z23 Encounter for immunization: Secondary | ICD-10-CM

## 2024-10-10 DIAGNOSIS — G8929 Other chronic pain: Secondary | ICD-10-CM

## 2024-10-10 DIAGNOSIS — E162 Hypoglycemia, unspecified: Secondary | ICD-10-CM

## 2024-10-10 DIAGNOSIS — I10 Essential (primary) hypertension: Secondary | ICD-10-CM

## 2024-10-10 NOTE — Progress Notes (Signed)
 Susan [name] he ate yesterday [facility]  Susan [name], is a 48 y.o. female  RDW:[id number]  FMW:[id number]  DOB - 1976/07/23  Chief Complaint  Patient presents with   Hypertension   Hyperlipidemia   Knee Pain    left   Nail Problem    Left foot big toe Nail yellow        Subjective:   Susan [name] is a 48 y.o. 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  Patient has noticed doing that.  Of 57-month left great toe had a small white spot which has progressed to almost covering the entire toe.  See picture. She also has hypertension- Patient has , No chest pain, No abdominal pain - No Nausea, No new weakness tingling or numbness, No Cough - shortness of breath/s Endorses headaches get dizzy intermittently.  Pain level is 7 out of 10 H/A  she also feels fatigued blood sugar on yesterday read 100 after she had breakfast.  This can be the underlying cause of headache and dizziness also not excluding hypertension. Patient is able to check blood sugar and blood pressure at home will have her to take a 2-week readings and if possible for blood sugar wake up around 2 or 3 and check blood sugar to rule out Somogi effect No problems updated.  Comprehensive ROS Pertinent positive and negative noted in HPI   No Known Allergies  Past Medical History:  Diagnosis Date   Atrial fibrillation (HCC)    Atrial fibrillation with rapid ventricular response (HCC) 06/19/2023   Diabetes 1.5, managed as type 2 (HCC)    DM (diabetes mellitus) (HCC)    Hypertension    Hyperthyroidism     Current Outpatient Medications on File Prior to Visit  Medication Sig Dispense Refill   apixaban  (ELIQUIS ) 5 MG TABS tablet Take 1 tablet (5 mg total) by mouth 2 (two) times daily. (Patient not taking: Reported on 10/10/2024) 60 tablet 3   atorvastatin  (LIPITOR) 20 MG tablet Take 1 tablet (20 mg total) by mouth daily. 90 tablet 1   lisinopril  (ZESTRIL ) 5 MG  tablet Take 1 tablet (5 mg total) by mouth daily. 90 tablet 1   metFORMIN  (GLUCOPHAGE -XR) 500 MG 24 hr tablet Take 1 tablet (500 mg total) by mouth 2 (two) times daily. 60 tablet 1   methimazole  (TAPAZOLE ) 10 MG tablet Take 1 tablet (10 mg total) by mouth 3 (three) times daily. 270 tablet 0   metoprolol  tartrate (LOPRESSOR ) 25 MG tablet Take 0.5 tablets (12.5 mg total) by mouth in the morning AND 1 tablet (25 mg total) every evening. 60 tablet 6   No current facility-administered medications on file prior to visit.   Health Maintenance  Topic Date Due   Eye exam for diabetics  Never done   Hepatitis B Vaccine (1 of 3 - 19+ 3-dose series) Never done   Colon Cancer Screening  Never done   Flu Shot  07/08/2024   COVID-19 Vaccine (1 - 2025-26 season) Never done   Hemoglobin A1C  10/04/2024   Pneumococcal Vaccine (1 of 2 - PCV) 04/04/2025*   Yearly kidney health urinalysis for diabetes  12/20/2024   Stool Blood Test  12/20/2024   Complete foot exam   04/04/2025   Yearly kidney function blood test for diabetes  06/27/2025   Breast Cancer Screening  07/14/2026   DTaP/Tdap/Td vaccine (2 - Td or Tdap) 04/14/2027   Pap with HPV screening  12/20/2028  Hepatitis C Screening  Completed   HIV Screening  Completed   HPV Vaccine  Aged Out   Meningitis B Vaccine  Aged Out  *Topic was postponed. The date shown is not the original due date.    Objective:   Vitals:   10/10/24 0903  BP: 131/78  Pulse: 61  Resp: 16  SpO2: 98%  Weight: 165 lb 3.2 oz (74.9 kg)   BP Readings from Last 3 Encounters:  10/10/24 131/78  10/06/24 124/80  07/14/24 (!) 140/61   Physical Exam Vitals reviewed.  Constitutional:      Appearance: Normal appearance. She is obese.  HENT:     Head: Normocephalic.     Right Ear: Tympanic membrane, ear canal and external ear normal.     Left Ear: Tympanic membrane, ear canal and external ear normal.     Nose: Nose normal.     Mouth/Throat:     Mouth: Mucous membranes  are moist.  Eyes:     Extraocular Movements: Extraocular movements intact.     Pupils: Pupils are equal, round, and reactive to light.  Cardiovascular:     Rate and Rhythm: Normal rate.  Pulmonary:     Effort: Pulmonary effort is normal.     Breath sounds: Normal breath sounds.  Abdominal:     General: Bowel sounds are normal.     Palpations: Abdomen is soft.  Musculoskeletal:        General: Normal range of motion.     Cervical back: Normal range of motion.  Skin:    General: Skin is warm and dry.  Neurological:     Mental Status: She is alert and oriented to person, place, and time.  Psychiatric:        Mood and Affect: Mood normal.        Behavior: Behavior normal.        Thought Content: Thought content normal.      Assessment & Plan   Duyen was seen today for hypertension, hyperlipidemia, knee pain and nail problem.  Diagnoses and all orders for this visit:  Symptomatic anemia -     CBC with Differential/Platelet  Essential hypertension BP goal - < 130/80 Explained that having normal blood pressure is the goal and medications are helping to get to goal and maintain normal blood pressure. DIET: Limit salt intake, read nutrition labels to check salt content, limit fried and high fatty foods  Avoid using multisymptom OTC cold preparations that generally contain sudafed which can rise BP. Consult with pharmacist on best cold relief products to use for persons with HTN EXERCISE Discussed incorporating exercise such as walking - 30 minutes most days of the week and can do in 10 minute intervals    Patient is able to check blood sugar and blood pressure at home will have her to take a 2-week readings and if possible for blood sugar wake up around 2 or 3 and check blood sugar to rule out Somogi effect -     CMP14+EGFR  Type 2 diabetes mellitus without complication, without long-term current use of insulin  (HCC) 2/2 Hypoglycemia Patient is able to check blood sugar and  blood pressure at home will have her to take a 2-week readings and if possible for blood sugar wake up around 2 or 3 and check blood sugar to rule out Somogi effect -     CBC with Differential/Platelet -     Iron, TIBC and Ferritin Panel -     Hemoglobin A1c  Chronic pain of left knee   Left knee pain denies trauma no crepitus or popping with assisted range of motion.  Discussed weight loss and possibly wearing a knee brace to see if that helps.  If problem becomes worse for reevaluation    Patient have been counseled extensively about nutrition and exercise. Other issues discussed during this visit include: low cholesterol diet, weight control and daily exercise, foot care, annual eye examinations at Ophthalmology, importance of adherence with medications and regular follow-up. We also discussed long term complications of uncontrolled diabetes and hypertension.   Fungal toenail infection -     Ambulatory referral to Podiatry      Return in about 3 months (around 01/10/2025).  The patient was given clear instructions to go to ER or return to medical center if symptoms don't improve, worsen or new problems develop. The patient verbalized understanding. The patient was told to call to get lab results if they haven't heard anything in the next week.   This note has been created with Education officer, environmental. Any transcriptional errors are unintentional.   [name], NP 10/10/2024, 10:06 AM

## 2024-10-11 ENCOUNTER — Other Ambulatory Visit: Payer: Self-pay | Admitting: "Endocrinology

## 2024-10-11 ENCOUNTER — Other Ambulatory Visit: Payer: Self-pay

## 2024-10-11 ENCOUNTER — Other Ambulatory Visit (INDEPENDENT_AMBULATORY_CARE_PROVIDER_SITE_OTHER): Payer: Self-pay | Admitting: Primary Care

## 2024-10-11 ENCOUNTER — Ambulatory Visit: Payer: Self-pay | Admitting: "Endocrinology

## 2024-10-11 DIAGNOSIS — E0500 Thyrotoxicosis with diffuse goiter without thyrotoxic crisis or storm: Secondary | ICD-10-CM

## 2024-10-11 LAB — CMP14+EGFR
ALT: 14 IU/L (ref 0–32)
AST: 13 IU/L (ref 0–40)
Albumin: 4.5 g/dL (ref 3.9–4.9)
Alkaline Phosphatase: 109 IU/L (ref 41–116)
BUN/Creatinine Ratio: 21 (ref 9–23)
BUN: 14 mg/dL (ref 6–24)
Bilirubin Total: 0.3 mg/dL (ref 0.0–1.2)
CO2: 20 mmol/L (ref 20–29)
Calcium: 9 mg/dL (ref 8.7–10.2)
Chloride: 106 mmol/L (ref 96–106)
Creatinine, Ser: 0.68 mg/dL (ref 0.57–1.00)
Globulin, Total: 2.9 g/dL (ref 1.5–4.5)
Glucose: 139 mg/dL — ABNORMAL HIGH (ref 70–99)
Potassium: 4.8 mmol/L (ref 3.5–5.2)
Sodium: 139 mmol/L (ref 134–144)
Total Protein: 7.4 g/dL (ref 6.0–8.5)
eGFR: 107 mL/min/1.73 (ref >59)

## 2024-10-11 LAB — IRON,TIBC AND FERRITIN PANEL
Ferritin: 6 ng/mL — ABNORMAL LOW (ref 15–150)
Iron Saturation: 6 % — CL (ref 15–55)
Iron: 23 ug/dL — ABNORMAL LOW (ref 27–159)
Total Iron Binding Capacity: 413 ug/dL (ref 250–450)
UIBC: 390 ug/dL (ref 131–425)

## 2024-10-11 LAB — CBC WITH DIFFERENTIAL/PLATELET
Basophils Absolute: 0 x10E3/uL (ref 0.0–0.2)
Basos: 0 %
EOS (ABSOLUTE): 0 x10E3/uL (ref 0.0–0.4)
Eos: 0 %
Hematocrit: 36.2 % (ref 34.0–46.6)
Hemoglobin: 10.6 g/dL — ABNORMAL LOW (ref 11.1–15.9)
Immature Grans (Abs): 0.1 x10E3/uL (ref 0.0–0.1)
Immature Granulocytes: 1 %
Lymphocytes Absolute: 3.9 x10E3/uL — ABNORMAL HIGH (ref 0.7–3.1)
Lymphs: 39 %
MCH: 21.5 pg — ABNORMAL LOW (ref 26.6–33.0)
MCHC: 29.3 g/dL — ABNORMAL LOW (ref 31.5–35.7)
MCV: 73 fL — ABNORMAL LOW (ref 79–97)
Monocytes Absolute: 0.7 x10E3/uL (ref 0.1–0.9)
Monocytes: 8 %
Neutrophils Absolute: 5.2 x10E3/uL (ref 1.4–7.0)
Neutrophils: 52 %
Platelets: 262 x10E3/uL (ref 150–450)
RBC: 4.94 x10E6/uL (ref 3.77–5.28)
RDW: 18.7 % — ABNORMAL HIGH (ref 11.7–15.4)
WBC: 9.9 x10E3/uL (ref 3.4–10.8)

## 2024-10-11 LAB — HEMOGLOBIN A1C
Est. average glucose Bld gHb Est-mCnc: 169 mg/dL
Hgb A1c MFr Bld: 7.5 % — ABNORMAL HIGH (ref 4.8–5.6)

## 2024-10-11 MED ORDER — METHIMAZOLE 10 MG PO TABS
10.0000 mg | ORAL_TABLET | Freq: Two times a day (BID) | ORAL | 1 refills | Status: AC
  Filled 2024-10-11: qty 60, 30d supply, fill #0
  Filled 2024-11-07: qty 60, 30d supply, fill #1
  Filled 2024-12-13: qty 60, 30d supply, fill #2
  Filled 2025-01-12: qty 60, 30d supply, fill #3

## 2024-10-12 ENCOUNTER — Other Ambulatory Visit: Payer: Self-pay

## 2024-10-12 MED ORDER — LISINOPRIL 5 MG PO TABS
5.0000 mg | ORAL_TABLET | Freq: Every day | ORAL | 1 refills | Status: DC
  Filled 2024-10-12 – 2024-11-08 (×4): qty 90, 90d supply, fill #0

## 2024-10-13 ENCOUNTER — Ambulatory Visit (INDEPENDENT_AMBULATORY_CARE_PROVIDER_SITE_OTHER): Payer: Self-pay | Admitting: Primary Care

## 2024-10-13 ENCOUNTER — Other Ambulatory Visit: Payer: Self-pay

## 2024-10-14 ENCOUNTER — Other Ambulatory Visit: Payer: Self-pay

## 2024-10-14 ENCOUNTER — Ambulatory Visit: Payer: Self-pay | Admitting: Podiatry

## 2024-10-14 ENCOUNTER — Encounter: Payer: Self-pay | Admitting: Podiatry

## 2024-10-14 VITALS — Ht <= 58 in | Wt 165.2 lb

## 2024-10-14 DIAGNOSIS — B351 Tinea unguium: Secondary | ICD-10-CM

## 2024-10-14 MED ORDER — TERBINAFINE HCL 250 MG PO TABS
250.0000 mg | ORAL_TABLET | Freq: Every day | ORAL | 0 refills | Status: DC
  Filled 2024-10-14: qty 90, 90d supply, fill #0

## 2024-10-14 NOTE — Progress Notes (Signed)
 Subjective:  Patient ID: Susan [name], female    DOB: 03-Dec-1976,  MRN: [id number]  Chief Complaint  Patient presents with   Nail Problem    Rm 9 Patient is here for thick fungal nail growth of the left hallux. Symptom present for the past 6 months. Patient has tried otc medication with no relief.    48 y.o. female presents with the above complaint.  Patient presents for left hallux thickened onychodystrophy mycotic toenails x 1.  She went to get it evaluated has not seen and was prior to seeing me she is a diabetic.  She denies seeing any nodules.  She would like to discuss treatment options for this.   Review of Systems: Negative except as noted in the HPI. Denies N/V/F/Ch.  Past Medical History:  Diagnosis Date   Atrial fibrillation [facility])    Atrial fibrillation with rapid ventricular response (HCC) 06/19/2023   Diabetes 1.5, managed as type 2 (HCC)    DM (diabetes mellitus) (HCC)    Hypertension    Hyperthyroidism     Current Outpatient Medications:    atorvastatin  (LIPITOR) 20 MG tablet, Take 1 tablet (20 mg total) by mouth daily., Disp: 90 tablet, Rfl: 1   lisinopril  (ZESTRIL ) 5 MG tablet, Take 1 tablet (5 mg total) by mouth daily., Disp: 90 tablet, Rfl: 1   metFORMIN  (GLUCOPHAGE -XR) 500 MG 24 hr tablet, Take 1 tablet (500 mg total) by mouth 2 (two) times daily., Disp: 60 tablet, Rfl: 1   methimazole  (TAPAZOLE ) 10 MG tablet, Take 1 tablet (10 mg total) by mouth 2 (two) times daily., Disp: 180 tablet, Rfl: 1   metoprolol  tartrate (LOPRESSOR ) 25 MG tablet, Take 0.5 tablets (12.5 mg total) by mouth in the morning AND 1 tablet (25 mg total) every evening., Disp: 60 tablet, Rfl: 6   terbinafine (LAMISIL) 250 MG tablet, Take 1 tablet (250 mg total) by mouth daily., Disp: 90 tablet, Rfl: 0   apixaban  (ELIQUIS ) 5 MG TABS tablet, Take 1 tablet (5 mg total) by mouth 2 (two) times daily. (Patient not taking: Reported on 10/14/2024), Disp: 60 tablet, Rfl: 3  Social History    Tobacco Use  Smoking Status Never  Smokeless Tobacco Never    No Known Allergies Objective:  There were no vitals filed for this visit. Body mass index is 35.75 kg/m. Constitutional Well developed. Well nourished.  Vascular Dorsalis pedis pulses palpable bilaterally. Posterior tibial pulses palpable bilaterally. Capillary refill normal to all digits.  No cyanosis or clubbing noted. Pedal hair growth normal.  Neurologic Normal speech. Oriented to person, place, and time. Epicritic sensation to light touch grossly present bilaterally.  Dermatologic Nails thick onychodystrophy mycotic toenails x 1 left hallux Skin within normal  Orthopedic: Normal joint ROM without pain or crepitus bilaterally. No visible deformities. No bony tenderness.   Radiographs: None Assessment:   1. Nail fungus    Plan:  Patient was evaluated and treated and all questions answered.  Left hallux onychomycosis -Educated the patient on the etiology of onychomycosis and various treatment options associated with improving the fungal load.  I explained to the patient that there is 3 treatment options available to treat the onychomycosis including topical, p.o., laser treatment.  Patient elected to undergo p.o. options with Lamisil/terbinafine therapy.  In order for me to start the medication therapy, I explained to the patient the importance of evaluating the liver and obtaining the liver function test.  Once the liver function test comes back normal I will start  him on 42-month course of Lamisil therapy.  Patient understood all risk and would like to proceed with Lamisil therapy.  I have asked the patient to immediately stop the Lamisil therapy if she has any reactions to it and call the office or go to the emergency room right away.  Patient states understanding   No follow-ups on file.

## 2024-10-31 ENCOUNTER — Other Ambulatory Visit: Payer: Self-pay

## 2024-11-01 ENCOUNTER — Other Ambulatory Visit: Payer: Self-pay

## 2024-11-07 ENCOUNTER — Other Ambulatory Visit: Payer: Self-pay

## 2024-11-08 ENCOUNTER — Other Ambulatory Visit: Payer: Self-pay

## 2024-11-29 ENCOUNTER — Other Ambulatory Visit (HOSPITAL_COMMUNITY): Payer: Self-pay

## 2024-11-29 DIAGNOSIS — E782 Mixed hyperlipidemia: Secondary | ICD-10-CM

## 2024-11-29 DIAGNOSIS — E119 Type 2 diabetes mellitus without complications: Secondary | ICD-10-CM

## 2024-11-29 DIAGNOSIS — Z760 Encounter for issue of repeat prescription: Secondary | ICD-10-CM

## 2024-11-29 DIAGNOSIS — I10 Essential (primary) hypertension: Secondary | ICD-10-CM

## 2024-11-29 MED ORDER — METFORMIN HCL ER 500 MG PO TB24
500.0000 mg | ORAL_TABLET | Freq: Two times a day (BID) | ORAL | 1 refills | Status: AC
  Filled 2024-11-29 – 2024-11-30 (×2): qty 180, 90d supply, fill #0

## 2024-11-29 MED ORDER — ATORVASTATIN CALCIUM 20 MG PO TABS
20.0000 mg | ORAL_TABLET | Freq: Every day | ORAL | 1 refills | Status: AC
  Filled 2024-11-29 – 2024-11-30 (×3): qty 90, 90d supply, fill #0

## 2024-11-30 ENCOUNTER — Other Ambulatory Visit: Payer: Self-pay

## 2024-11-30 ENCOUNTER — Other Ambulatory Visit (HOSPITAL_COMMUNITY): Payer: Self-pay

## 2024-12-02 ENCOUNTER — Other Ambulatory Visit (HOSPITAL_COMMUNITY): Payer: Self-pay

## 2024-12-13 ENCOUNTER — Other Ambulatory Visit: Payer: Self-pay

## 2024-12-15 ENCOUNTER — Other Ambulatory Visit: Payer: Self-pay

## 2024-12-26 ENCOUNTER — Ambulatory Visit (HOSPITAL_COMMUNITY)
Admission: RE | Admit: 2024-12-26 | Discharge: 2024-12-26 | Disposition: A | Discharge status: Home / Self Care | Payer: Self-pay | Source: Ambulatory Visit | Attending: Internal Medicine | Admitting: Internal Medicine

## 2024-12-26 ENCOUNTER — Ambulatory Visit (HOSPITAL_COMMUNITY)
Admission: RE | Admit: 2024-12-26 | Discharge: 2024-12-26 | Disposition: A | Payer: Self-pay | Source: Ambulatory Visit | Attending: Internal Medicine | Admitting: Internal Medicine

## 2024-12-26 ENCOUNTER — Encounter (HOSPITAL_COMMUNITY): Payer: Self-pay | Admitting: Internal Medicine

## 2024-12-26 VITALS — BP 134/74 | HR 64 | Ht <= 58 in | Wt 160.6 lb

## 2024-12-26 DIAGNOSIS — I480 Paroxysmal atrial fibrillation: Secondary | ICD-10-CM

## 2024-12-26 DIAGNOSIS — I10 Essential (primary) hypertension: Secondary | ICD-10-CM

## 2024-12-26 DIAGNOSIS — D6869 Other thrombophilia: Secondary | ICD-10-CM

## 2024-12-26 MED ORDER — METOPROLOL TARTRATE 25 MG PO TABS: ORAL_TABLET | ORAL | Status: AC

## 2024-12-26 NOTE — Patient Instructions (Signed)
 Wear monitor for 2 weeks remove 01/09/25 and send in the mail

## 2024-12-26 NOTE — Progress Notes (Signed)
 "   Primary Care Physician: Celestia Rosaline SQUIBB, NP Primary Cardiologist: Susan Bruckner, MD Electrophysiologist: None  Referring Physician: Dr Madelyne Countryman [name] is a 49 y.o. female with a history of DM, hyperthyroidism, HTN, atrial fibrillation who presents for consultation in the [facility]. The patient was initially diagnosed with atrial fibrillation 06/18/23 after presenting to the ED with symptoms of tachypalpitations. ECG showed afib with RVR and she was started on diltiazem  which converted her to SR. Patient was also started on Eliquis  for a CHADS2VASC score of 3. TSH on admission was <0.01, T3 and T4 at 23.8 and 4.47 respectively. She was started on methimazole . Echo showed normal EF, mild MR, mild AS.   On evaluation today, she is currently in NSR. She has remained compliant with methimazole  for thyroid  treatment. She has not had any episodes of Afib since hospital visit. She has been taking accidentally 1 whole tablet of metoprolol  twice daily instead of half tablet twice daily. No missed Eliquis . She has an Apple watch.   On follow up 12/24/23, she is currently in NSR. She has had no episodes of Afib since last office visit. She has noted some brief palpitations in the evening before bed at times. She remains compliant with treatment for hyperthyroidism. No bleeding issues on Eliquis ; patient notes she is likely beginning menopause.   On follow up 06/23/24, she is currently in NSR. She has had low burden of Afib overall. Patient continues to have menstrual cycles. Last CBC 03/20/24 with Hgb 8.9. No missed doses of Eliquis .   Follow up 12/26/24, patient is currently in NSR. She appears to be maintaining SR overall. We previously discussed with patient and recommended to stop DOAC due to menstrual cycles and anemia which required transfusion. She notes intermittent palpitations and did not purchase Kardiamobile device. She is taking  Lopressor  12.5 mg BID.  Today, she denies symptoms of chest pain, shortness of breath, orthopnea, PND, lower extremity edema, dizziness, presyncope, syncope, snoring, daytime somnolence, bleeding, or neurologic sequela. The patient is tolerating medications without difficulties and is otherwise without complaint today.   Atrial Fibrillation Management history:  Previous antiarrhythmic drugs: none Previous cardioversions: none Previous ablations: none Anticoagulation history: Eliquis   ROS- All systems are reviewed and negative except as per the HPI above.   Physical Exam: BP 134/74   Pulse 64   Ht 4' 9 (1.448 m)   Wt 72.8 kg   LMP 12/25/2024 (Exact Date)   BMI 34.75 kg/m   GEN- The patient is well appearing, alert and oriented x 3 today.   Neck - no JVD or carotid bruit noted Lungs- Clear to ausculation bilaterally, normal work of breathing Heart- Regular rate and rhythm, no murmurs, rubs or gallops, PMI not laterally displaced Extremities- no clubbing, cyanosis, or edema Skin - no rash or ecchymosis noted   Wt Readings from Last 3 Encounters:  12/26/24 72.8 kg  10/14/24 74.9 kg  10/10/24 74.9 kg     EKG today demonstrates  EKG Interpretation Date/Time:  Monday December 26 2024 09:08:45 EST Ventricular Rate:  64 PR Interval:  138 QRS Duration:  78 QT Interval:  378 QTC Calculation: 389 R Axis:   47  Text Interpretation: Normal sinus rhythm Normal ECG When compared with ECG of 26-Jun-2024 15:45, No significant change was found Confirmed by Terra Pac ([id number]) on 12/26/2024 9:44:30 AM     Echo 06/20/23 demonstrated   1. Left ventricular ejection fraction, by estimation, is  60 to 65%. The  left ventricle has normal function. The left ventricle has no regional  wall motion abnormalities. Left ventricular diastolic parameters were  normal.   2. Right ventricular systolic function is normal. The right ventricular  size is normal. There is moderately elevated pulmonary  artery systolic  pressure.   3. The mitral valve is normal in structure. Mild mitral valve  regurgitation. No evidence of mitral stenosis.   4. The aortic valve is tricuspid. Aortic valve regurgitation is mild.  Mild aortic valve stenosis. Aortic valve mean gradient measures 10.0 mmHg.  Aortic valve Vmax measures 2.30 m/s.   5. The inferior vena cava is dilated in size with <50% respiratory  variability, suggesting right atrial pressure of 15 mmHg.   Comparison(s): No prior Echocardiogram.   CHA2DS2-VASc Score = 3  The patient's score is based upon: CHF History: 0 HTN History: 1 Diabetes History: 1 Stroke History: 0 Vascular Disease History: 0 Age Score: 0 Gender Score: 1       ASSESSMENT AND PLAN: Paroxysmal Atrial Fibrillation (ICD10:  I48.0) The patient's CHA2DS2-VASc score is 3, indicating a 3.2% annual risk of stroke.    Patient is currently in NSR. She notes intermittent palpitations. We will place a 2 week Zio monitor to assess Afib burden. Continue Lopressor  12.5 mg BID.  Secondary Hypercoagulable State (ICD10:  D68.69) The patient is at significant risk for stroke/thromboembolism based upon her CHA2DS2-VASc Score of 3.  Patient is not currently on anticoagulation due to menstrual cycles and anemia which required transfusion.   HTN Stable today.    Hyperthyroidism On methimazole     Follow up 6 months Afib clinic.    [name], PA-C Af[facility] [address] [place], [place] [address] [phone] "

## 2024-12-27 ENCOUNTER — Other Ambulatory Visit: Payer: Self-pay

## 2024-12-28 ENCOUNTER — Other Ambulatory Visit (INDEPENDENT_AMBULATORY_CARE_PROVIDER_SITE_OTHER): Payer: Self-pay | Admitting: Primary Care

## 2024-12-28 NOTE — Telephone Encounter (Signed)
 Copied from CRM #[id number]. Topic: Clinical - Medication Refill >> Dec 28, 2024  2:54 PM Zebedee SAUNDERS wrote: Medication: lisinopril  (ZESTRIL ) 10 MG tablet  Has the patient contacted their pharmacy? Yes (Agent: If no, request that the patient contact the pharmacy for the refill. If patient does not wish to contact the pharmacy document the reason why and proceed with request.) (Agent: If yes, when and what did the pharmacy advise?)Pharmacy need script  This is the patient's preferred pharmacy:  [facility] [address] [place] [address] Phone: [phone] Fax: [phone]  Is this the correct pharmacy for this prescription? Yes If no, delete pharmacy and type the correct one.   Has the prescription been filled recently? Yes  Is the patient out of the medication? Yes  Has the patient been seen for an appointment in the last year OR does the patient have an upcoming appointment? Yes  Can we respond through MyChart? Yes  Agent: Please be advised that Rx refills may take up to 3 business days. We ask that you follow-up with your pharmacy.

## 2024-12-30 ENCOUNTER — Other Ambulatory Visit: Payer: Self-pay

## 2024-12-30 ENCOUNTER — Other Ambulatory Visit (INDEPENDENT_AMBULATORY_CARE_PROVIDER_SITE_OTHER): Payer: Self-pay | Admitting: Primary Care

## 2024-12-30 ENCOUNTER — Other Ambulatory Visit

## 2024-12-30 LAB — T3, FREE: T3, Free: 4.1 pg/mL (ref 2.3–4.2)

## 2024-12-30 LAB — TSH: TSH: 0.23 m[IU]/L — ABNORMAL LOW

## 2024-12-30 LAB — T4, FREE: Free T4: 1 ng/dL (ref 0.8–1.8)

## 2024-12-30 MED ORDER — LISINOPRIL 10 MG PO TABS
10.0000 mg | ORAL_TABLET | Freq: Every day | ORAL | 1 refills | Status: DC
  Filled 2024-12-30: qty 30, 30d supply, fill #0
  Filled 2024-12-30: qty 60, 60d supply, fill #0

## 2025-01-02 ENCOUNTER — Other Ambulatory Visit: Payer: Self-pay

## 2025-01-02 ENCOUNTER — Other Ambulatory Visit

## 2025-01-02 ENCOUNTER — Encounter: Admitting: Physician Assistant

## 2025-01-02 MED ORDER — LISINOPRIL 10 MG PO TABS
10.0000 mg | ORAL_TABLET | Freq: Every day | ORAL | 1 refills | Status: AC
  Filled 2025-01-02: qty 90, 90d supply, fill #0

## 2025-01-03 LAB — THYROID STIMULATING IMMUNOGLOBULIN: TSI: 209 % baseline — ABNORMAL HIGH

## 2025-01-03 LAB — TRAB (TSH RECEPTOR BINDING ANTIBODY): TRAB: 5.65 [IU]/L — ABNORMAL HIGH

## 2025-01-09 ENCOUNTER — Ambulatory Visit (INDEPENDENT_AMBULATORY_CARE_PROVIDER_SITE_OTHER): Admitting: Primary Care

## 2025-01-09 ENCOUNTER — Ambulatory Visit: Admitting: "Endocrinology

## 2025-01-12 ENCOUNTER — Other Ambulatory Visit: Payer: Self-pay

## 2025-01-12 ENCOUNTER — Telehealth (INDEPENDENT_AMBULATORY_CARE_PROVIDER_SITE_OTHER): Payer: Self-pay | Admitting: Primary Care

## 2025-01-12 ENCOUNTER — Other Ambulatory Visit (HOSPITAL_COMMUNITY): Payer: Self-pay | Admitting: Internal Medicine

## 2025-01-12 DIAGNOSIS — I10 Essential (primary) hypertension: Secondary | ICD-10-CM

## 2025-01-12 MED ORDER — METOPROLOL TARTRATE 25 MG PO TABS
12.5000 mg | ORAL_TABLET | Freq: Two times a day (BID) | ORAL | 6 refills | Status: AC
  Filled 2025-01-12: qty 90, 90d supply, fill #0

## 2025-01-12 NOTE — Telephone Encounter (Signed)
 Spoke to pt about upcoming appt.. Will be present

## 2025-01-13 ENCOUNTER — Other Ambulatory Visit: Payer: Self-pay

## 2025-01-13 ENCOUNTER — Encounter (INDEPENDENT_AMBULATORY_CARE_PROVIDER_SITE_OTHER): Payer: Self-pay | Admitting: Primary Care

## 2025-01-13 ENCOUNTER — Ambulatory Visit: Payer: Self-pay

## 2025-01-13 ENCOUNTER — Ambulatory Visit (INDEPENDENT_AMBULATORY_CARE_PROVIDER_SITE_OTHER): Admitting: Primary Care

## 2025-01-13 VITALS — BP 135/79 | HR 63 | Temp 97.7°F | Resp 16 | Ht <= 58 in | Wt 160.0 lb

## 2025-01-13 DIAGNOSIS — E139 Other specified diabetes mellitus without complications: Secondary | ICD-10-CM

## 2025-01-13 DIAGNOSIS — D649 Anemia, unspecified: Secondary | ICD-10-CM

## 2025-01-13 DIAGNOSIS — I480 Paroxysmal atrial fibrillation: Secondary | ICD-10-CM

## 2025-01-13 DIAGNOSIS — I10 Essential (primary) hypertension: Secondary | ICD-10-CM

## 2025-01-13 DIAGNOSIS — R10A3 Flank pain, bilateral: Secondary | ICD-10-CM

## 2025-01-13 DIAGNOSIS — E782 Mixed hyperlipidemia: Secondary | ICD-10-CM

## 2025-01-13 NOTE — Progress Notes (Unsigned)
 " [facility]  Susan [name], is a 49 y.o. female  RDW:[id number]  FMW:[id number]  DOB - 08-Apr-1976  No chief complaint on file.      Subjective:   Susan [name] is a 49 y.o. 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 Patient has No headache, No chest pain, No abdominal pain - No Nausea, No new weakness tingling or numbness, No Cough - shortness of breath Bilateral  flank pain. Diabetes-Denies polyuria, polydipsia, polyphasia or vision changes.  Does not check blood sugars at home.   No problems updated.  Comprehensive ROS Pertinent positive and negative noted in HPI   Allergies[1]  Past Medical History:  Diagnosis Date   Atrial fibrillation (HCC)    Atrial fibrillation with rapid ventricular response (HCC) 06/19/2023   Diabetes 1.5, managed as type 2 (HCC)    DM (diabetes mellitus) (HCC)    Hypertension    Hyperthyroidism     Medications Ordered Prior to Encounter[2] Health Maintenance  Topic Date Due   Eye exam for diabetics  Never done   Hepatitis B Vaccine (1 of 3 - 19+ 3-dose series) Never done   Colon Cancer Screening  Never done   COVID-19 Vaccine (1 - 2025-26 season) Never done   Kidney health urinalysis for diabetes  12/20/2024   Stool Blood Test  12/20/2024   Pneumococcal Vaccine (1 of 2 - PCV) 04/04/2025*   Complete foot exam   04/04/2025   Hemoglobin A1C  04/09/2025   Yearly kidney function blood test for diabetes  10/10/2025   Breast Cancer Screening  07/14/2026   DTaP/Tdap/Td vaccine (2 - Td or Tdap) 04/14/2027   Pap with HPV screening  12/20/2028   Flu Shot  Completed   HPV Vaccine (No Doses Required) Completed   Hepatitis C Screening  Completed   HIV Screening  Completed   Meningitis B Vaccine  Aged Out  *Topic was postponed. The date shown is not the original due date.    Objective:  LMP 12/25/2024 (Exact Date)     Physical Exam Vitals reviewed.  Constitutional:       Appearance: Normal appearance. She is obese.  HENT:     Head: Normocephalic.     Right Ear: Tympanic membrane, ear canal and external ear normal.     Left Ear: Tympanic membrane, ear canal and external ear normal.     Nose: Nose normal.     Mouth/Throat:     Mouth: Mucous membranes are moist.  Eyes:     Extraocular Movements: Extraocular movements intact.     Pupils: Pupils are equal, round, and reactive to light.  Cardiovascular:     Rate and Rhythm: Normal rate and regular rhythm.  Pulmonary:     Effort: Pulmonary effort is normal.     Breath sounds: Normal breath sounds.  Abdominal:     General: Bowel sounds are normal.     Palpations: Abdomen is soft.  Musculoskeletal:        General: Normal range of motion.     Cervical back: Normal range of motion and neck supple.  Skin:    General: Skin is warm and dry.  Neurological:     Mental Status: She is alert and oriented to person, place, and time.  Psychiatric:        Mood and Affect: Mood normal.        Behavior: Behavior normal.        Thought Content: Thought content normal.  Assessment & Plan  Diagnoses and all orders for this visit:  Symptomatic anemia -     CBC with Differential/Platelet; Future -     Iron, TIBC and Ferritin Panel; Future  Paroxysmal atrial fibrillation (HCC) Followed by cardiology Afib   Essential hypertension BP goal - < 130/80 Explained that having normal blood pressure is the goal and medications are helping to get to goal and maintain normal blood pressure. DIET: Limit salt intake, read nutrition labels to check salt content, limit fried and high fatty foods  Avoid using multisymptom OTC cold preparations that generally contain sudafed which can rise BP. Consult with pharmacist on best cold relief products to use for persons with HTN EXERCISE Discussed incorporating exercise such as walking - 30 minutes most days of the week and can do in 10 minute intervals    -     CMP14+EGFR;  Future  Mixed hyperlipidemia  Healthy lifestyle diet of fruits vegetables fish nuts whole grains and low saturated fat . Foods high in cholesterol or liver, fatty meats,cheese, butter avocados, nuts and seeds, chocolate and fried foods. 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 Other issues discussed during this visit include: low cholesterol diet, weight control and daily exercise, foot care, annual eye examinations at Ophthalmology, importance of adherence with medications and regular follow-up. We also discussed long term complications of uncontrolled diabetes and hypertension.   Return in about 3 months (around 04/12/2025) for fasting labs.  The patient was given clear instructions to go to ER or return to medical center if symptoms don't improve, worsen or new problems develop. The patient verbalized understanding. The patient was told to call to get lab results if they haven't heard anything in the next week.   This note has been created with Education officer, environmental. Any transcriptional errors are unintentional.   Susan SHAUNNA Bohr, NP 01/13/2025, 10:03 AM    [1] No Known Allergies [2]  Current Outpatient Medications on File Prior to Visit  Medication Sig Dispense Refill   apixaban  (ELIQUIS ) 5 MG TABS tablet Take 1 tablet (5 mg total) by mouth 2 (two) times daily. (Patient not taking: Reported on 12/26/2024) 60 tablet 3   atorvastatin  (LIPITOR) 20 MG tablet Take 1 tablet (20 mg total) by mouth daily. 90 tablet 1   lisinopril  (ZESTRIL ) 10 MG tablet Take 1 tablet (10 mg total) by mouth daily. 90 tablet 1   metFORMIN  (GLUCOPHAGE -XR) 500 MG 24 hr tablet Take 1 tablet (500 mg total) by mouth 2 (two) times daily. 180 tablet 1    methimazole  (TAPAZOLE ) 10 MG tablet Take 1 tablet (10 mg total) by mouth 2 (two) times daily. 180 tablet 1   metoprolol  tartrate (LOPRESSOR ) 25 MG tablet Take 0.5 tablets (12.5 mg total) by mouth in the morning AND 0.5 tablets (12.5 mg total) every evening.     metoprolol  tartrate (LOPRESSOR ) 25 MG tablet Take 0.5 tablets (12.5 mg total) by mouth 2 (two) times daily. 90 tablet 6   No current facility-administered medications on file prior to visit.   "

## 2025-01-18 ENCOUNTER — Ambulatory Visit: Admitting: "Endocrinology

## 2025-02-21 ENCOUNTER — Encounter: Admitting: Obstetrics and Gynecology

## 2025-04-17 ENCOUNTER — Ambulatory Visit (INDEPENDENT_AMBULATORY_CARE_PROVIDER_SITE_OTHER): Payer: Self-pay | Admitting: Primary Care
